# Patient Record
Sex: Male | Born: 1935
Health system: Southern US, Community
[De-identification: ages and names within clinical notes are randomized; demographics above are authoritative.]

## PROBLEM LIST (undated history)

## (undated) DIAGNOSIS — I82409 Acute embolism and thrombosis of unspecified deep veins of unspecified lower extremity: Secondary | ICD-10-CM

## (undated) DIAGNOSIS — H9190 Unspecified hearing loss, unspecified ear: Secondary | ICD-10-CM

## (undated) DIAGNOSIS — K259 Gastric ulcer, unspecified as acute or chronic, without hemorrhage or perforation: Secondary | ICD-10-CM

## (undated) DIAGNOSIS — M1990 Unspecified osteoarthritis, unspecified site: Secondary | ICD-10-CM

## (undated) DIAGNOSIS — I10 Essential (primary) hypertension: Secondary | ICD-10-CM

## (undated) HISTORY — PX: SHOULDER SURGERY: SHX246

## (undated) HISTORY — PX: JOINT REPLACEMENT: SHX530

## (undated) HISTORY — PX: REPLACEMENT TOTAL KNEE BILATERAL: SUR1225

## (undated) HISTORY — PX: OTHER SURGICAL HISTORY: SHX169

## (undated) HISTORY — DX: Acute embolism and thrombosis of unspecified deep veins of unspecified lower extremity: I82.409

---

## 1999-05-23 ENCOUNTER — Encounter: Admission: RE | Admit: 1999-05-23 | Discharge: 1999-06-24 | Payer: Self-pay | Admitting: Orthopedic Surgery

## 1999-09-17 ENCOUNTER — Encounter (INDEPENDENT_AMBULATORY_CARE_PROVIDER_SITE_OTHER): Payer: Self-pay | Admitting: Specialist

## 1999-09-17 ENCOUNTER — Ambulatory Visit (HOSPITAL_COMMUNITY): Admission: RE | Admit: 1999-09-17 | Discharge: 1999-09-17 | Payer: Self-pay | Admitting: Gastroenterology

## 2002-03-08 ENCOUNTER — Emergency Department (HOSPITAL_COMMUNITY): Admission: EM | Admit: 2002-03-08 | Discharge: 2002-03-08 | Payer: Self-pay | Admitting: Emergency Medicine

## 2009-07-24 ENCOUNTER — Ambulatory Visit (HOSPITAL_COMMUNITY): Admission: RE | Admit: 2009-07-24 | Discharge: 2009-07-24 | Payer: Self-pay | Admitting: Orthopedic Surgery

## 2010-09-12 NOTE — Procedures (Signed)
[facility]  Patient:    Steven Horton, Steven Horton                      MRN: [id number] Proc. Date: 09/17/99 Adm. Date:  [id number] Disc. Date: [id number] Attending:  Rafael Bihari CC:         Charles A. Harrington Challenger, M.D.                           Procedure Report  PROCEDURE:  Colonoscopy with polypectomy.  INDICATIONS FOR PROCEDURE:  Family history of colon cancer in a first degree relative.  DESCRIPTION OF PROCEDURE:  The patient was placed in the left lateral decubitus  position and placed on the pulse monitor with continuous low flow oxygen delivered by nasal cannula.  He was sedated with 50 mg of IV Demerol and 4 mg of IV Versed. The Olympus video colonoscope was inserted into the rectum and advanced to the cecum, confirmed by transillumination of McBurneys point and visualization of the ileocecal valve and appendiceal orifice.  The prep was good.  The cecum, ascending and transverse colon appeared normal with no masses, polyps, diverticula or other mucosal abnormalities.  Within the descending colon and sigmoid colon were seen  several scattered diverticula with no polyps.  In the rectum, there were seen two 8 mm sessile polyps, which were both fulgurated by hot biopsy.  The remainder of he rectum appeared normal down to the anus, where there were some small internal hemorrhoids seen.  The colonoscope was then withdrawn and the patient returned o the recovery room in stable condition.  He tolerated the procedure well and there were no immediate complications.  IMPRESSION: 1. Two rectal polyps. 2. Left sided diverticulosis. 3. Small internal hemorrhoids.  PLAN:  Await biopsy results for determination of interval for next surveillance  colonoscopy. DD:  09/17/99 TD:  09/22/99 Job: [id number]

## 2010-10-11 ENCOUNTER — Emergency Department (HOSPITAL_COMMUNITY)
Admission: EM | Admit: 2010-10-11 | Discharge: 2010-10-11 | Disposition: A | Discharge status: Home / Self Care | Payer: Medicare Other | Attending: Emergency Medicine | Admitting: Emergency Medicine

## 2010-10-11 ENCOUNTER — Emergency Department (HOSPITAL_COMMUNITY): Payer: Medicare Other

## 2010-10-11 DIAGNOSIS — R1310 Dysphagia, unspecified: Secondary | ICD-10-CM | POA: Insufficient documentation

## 2010-10-11 DIAGNOSIS — K222 Esophageal obstruction: Secondary | ICD-10-CM | POA: Insufficient documentation

## 2010-10-11 DIAGNOSIS — R0789 Other chest pain: Secondary | ICD-10-CM

## 2010-10-11 DIAGNOSIS — R634 Abnormal weight loss: Secondary | ICD-10-CM | POA: Insufficient documentation

## 2010-10-11 DIAGNOSIS — R630 Anorexia: Secondary | ICD-10-CM | POA: Insufficient documentation

## 2010-10-11 DIAGNOSIS — R61 Generalized hyperhidrosis: Secondary | ICD-10-CM | POA: Insufficient documentation

## 2010-10-11 DIAGNOSIS — Z79899 Other long term (current) drug therapy: Secondary | ICD-10-CM | POA: Insufficient documentation

## 2010-10-11 LAB — DIFFERENTIAL
Eosinophils Absolute: 0.1 10*3/uL (ref 0.0–0.7)
Eosinophils Relative: 1 % (ref 0–5)
Lymphocytes Relative: 13 % (ref 12–46)
Lymphs Abs: 1.2 10*3/uL (ref 0.7–4.0)
Monocytes Absolute: 0.9 10*3/uL (ref 0.1–1.0)
Neutro Abs: 7 10*3/uL (ref 1.7–7.7)

## 2010-10-11 LAB — CBC
Hemoglobin: 15.2 g/dL (ref 13.0–17.0)
MCH: 32.5 pg (ref 26.0–34.0)
MCHC: 35.5 g/dL (ref 30.0–36.0)
RBC: 4.67 MIL/uL (ref 4.22–5.81)
RDW: 14.2 % (ref 11.5–15.5)

## 2010-10-11 LAB — BASIC METABOLIC PANEL
CO2: 19 mEq/L (ref 19–32)
Calcium: 9.5 mg/dL (ref 8.4–10.5)
Creatinine, Ser: 1.71 mg/dL — ABNORMAL HIGH (ref 0.50–1.35)
GFR calc Af Amer: 47 mL/min — ABNORMAL LOW (ref >60)
Potassium: 3.9 mEq/L (ref 3.5–5.1)

## 2010-10-11 LAB — CK TOTAL AND CKMB (NOT AT ARMC): CK, MB: 3.4 ng/mL (ref 0.3–4.0)

## 2010-10-15 NOTE — Consult Note (Signed)
NAME:  Steven Horton, Steven Horton NO.:  [id number]  MEDICAL RECORD NO.:  [id number]  LOCATION:  [facility]                         FACILITY:  [facility]  PHYSICIAN:  Minus Breeding, MD, FACCDATE OF BIRTH:  1930/11/23  DATE OF CONSULTATION:  10/11/2010 DATE OF DISCHARGE:  10/11/2010                                CONSULTATION   PRIMARY CARE PROVIDER:  Antony Haste (C.Alan) Harrington Challenger, MD  CARDIOLOGIST:  None.  REASON FOR CONSULTATION:  Evaluate patient with chest discomfort.  HISTORY OF PRESENT ILLNESS:  The patient is a 77 year old gentleman without prior cardiac history.  He says that on Thursday a couple of hours after swallowing some pills he had some nausea and vomiting.  He reports that he did not take the pills with water as has been instructed.  Since that time he has had a discomfort in his chest because of pressure.  It happens with swallowing.  He has never had this before.  There is no neck or arm discomfort.  There is no associated diaphoresis or shortness of breath.  He has had no palpitations, presyncope or syncope.  He has been able to push a lawn mower without bringing on this discomfort.  Again, the only reproducible pain is with swallowing.  He had this off and on through Friday and today.  He presented to [facility] and was referred to the emergency room. Here his cardiac markers x1 have been negative.  EKG demonstrates no acute findings.  He otherwise has no prior cardiac history.  He has been well and denies any PND or orthopnea.  He is active around his house. He has had about a 45-pound weight loss in the last year with some anorexia.  PAST MEDICAL HISTORY:  Hypertension, hyperlipidemia.  PAST SURGICAL HISTORY:  Bilateral total knee replacement, right shoulder surgery x2, deviated septum surgery, multiple benign skin lesions removed apparently.  ALLERGIES:  None.  MEDICATIONS:  Doxycycline, fenofibrate, hydrochlorothiazide, tramadol.  SOCIAL HISTORY:  The  patient is single.  He does not have children.  He is in the emergency room with his sister.  He did smoke for about 20 years not heavily.  He quit 20 years ago.  FAMILY HISTORY:  Noncontributory for early coronary artery disease.  His mother had heart failure in her 22s.  REVIEW OF SYSTEMS:  As stated in the HPI.  He is positive for some mild streaking of red blood with constipation.  Otherwise, negative for all other systems.  PHYSICAL EXAMINATION:  GENERAL:  The patient is pleasant and in no distress. VITAL SIGNS:  Blood pressure 140/70, heart rate 68 and regular, afebrile, respiratory rate 16. HEENT:  Eyelids unremarkable.  Pupils equal, round and reactive to light, fundi not visualized, oral mucosa unremarkable. NECK:  No jugular venous distention at 45 degrees, carotid upstroke brisk and symmetric, no bruits, no thyromegaly. LYMPHATICS:  No cervical, axillary, inguinal. LUNGS:  Clear to auscultation bilaterally. BACK:  No costovertebral angle tenderness. CHEST:  Unremarkable. HEART:  PMI not displaced or sustained, S1-S2 within normal limits.  No S3, no S4, no clicks, no rubs, no murmurs. ABDOMEN:  Flat, positive bowel sounds normal in frequency and pitch, no bruits, no rebound, no guarding  or midline pulsatile mass.  No hepatomegaly, splenomegaly. SKIN:  No rashes, no nodules. EXTREMITIES:  2+ pulses throughout, no edema, no cyanosis or clubbing. NEURO:  Oriented to person, place, and time.  Cranial nerves II-XII grossly intact, motor grossly intact.  LABORATORY DATA:  Sodium 137, potassium 3.9, BUN 32, creatinine 1.71, CK 133, MD 3.4.  WBC 9.5, hemoglobin 15.2, platelets 216.  EKG sinus rhythm with premature atrial contractions, axis within normal limits, intervals within normal limits, early transition lead V2, no acute ST-T wave changes.  Chest x-ray no acute disease.  ASSESSMENT/PLAN: 1. The patient's chest comfort is very atypical.  He has a     reproducible  discomfort when he tries to swallow.  He has mild     cardiovascular risk factors.  His exam demonstrates no obvious     cardiac pathology.  EKG is normal.  Enzymes x1 are normal.  At this     point, the pretest probability of this discomfort is obstructive     coronary artery disease is extremely low.  It is most likely     something gastrointestinal.  It also must be noted that the patient     has had an unexplained weight loss with some anorexia.  No further     cardiac workup is indicated. 2. Hypertension.  Blood pressure is borderline but he continue with     meds as listed. 3. Dyslipidemia, apparently has some hypertriglyceridemia.  He is on     fenofibrate.  His labs were very good, last unable to check.  Over     the course defer to his primary provider.     Minus Breeding, MD, [facility]     JH/MEDQ  D:  10/11/2010  T:  10/12/2010  Job:  [id number]  cc:   Delmar Landau) Harrington Challenger, M.D.  Electronically Signed by Minus Breeding MD [facility] on 10/15/2010 11:47:42 AM

## 2010-11-28 ENCOUNTER — Other Ambulatory Visit: Payer: Self-pay | Admitting: Gastroenterology

## 2010-11-28 DIAGNOSIS — R634 Abnormal weight loss: Secondary | ICD-10-CM

## 2010-12-03 ENCOUNTER — Other Ambulatory Visit: Payer: 59

## 2010-12-05 ENCOUNTER — Ambulatory Visit
Admission: RE | Admit: 2010-12-05 | Discharge: 2010-12-05 | Disposition: A | Discharge status: Home / Self Care | Payer: Medicare Other | Source: Ambulatory Visit | Attending: Gastroenterology | Admitting: Gastroenterology

## 2010-12-05 DIAGNOSIS — R634 Abnormal weight loss: Secondary | ICD-10-CM

## 2010-12-05 MED ORDER — IOHEXOL 300 MG/ML  SOLN
65.0000 mL | Freq: Once | INTRAMUSCULAR | Status: AC | PRN
  Administered 2010-12-05: 65 mL via INTRAVENOUS

## 2011-03-06 ENCOUNTER — Encounter: Payer: Self-pay | Admitting: *Deleted

## 2011-03-06 ENCOUNTER — Emergency Department (HOSPITAL_COMMUNITY)
Admission: EM | Admit: 2011-03-06 | Discharge: 2011-03-06 | Disposition: A | Discharge status: Home / Self Care | Payer: Medicare Other | Attending: Emergency Medicine | Admitting: Emergency Medicine

## 2011-03-06 ENCOUNTER — Emergency Department (HOSPITAL_COMMUNITY): Payer: Medicare Other

## 2011-03-06 DIAGNOSIS — Y9301 Activity, walking, marching and hiking: Secondary | ICD-10-CM | POA: Insufficient documentation

## 2011-03-06 DIAGNOSIS — S20229A Contusion of unspecified back wall of thorax, initial encounter: Secondary | ICD-10-CM | POA: Insufficient documentation

## 2011-03-06 DIAGNOSIS — W19XXXA Unspecified fall, initial encounter: Secondary | ICD-10-CM | POA: Insufficient documentation

## 2011-03-06 DIAGNOSIS — I10 Essential (primary) hypertension: Secondary | ICD-10-CM | POA: Insufficient documentation

## 2011-03-06 DIAGNOSIS — Z79899 Other long term (current) drug therapy: Secondary | ICD-10-CM | POA: Insufficient documentation

## 2011-03-06 DIAGNOSIS — Z8739 Personal history of other diseases of the musculoskeletal system and connective tissue: Secondary | ICD-10-CM | POA: Insufficient documentation

## 2011-03-06 HISTORY — DX: Essential (primary) hypertension: I10

## 2011-03-06 HISTORY — DX: Gastric ulcer, unspecified as acute or chronic, without hemorrhage or perforation: K25.9

## 2011-03-06 HISTORY — DX: Unspecified osteoarthritis, unspecified site: M19.90

## 2011-03-06 MED ORDER — LIDOCAINE 5 % EX PTCH
1.0000 | MEDICATED_PATCH | CUTANEOUS | Status: DC
  Administered 2011-03-06: 1 via TRANSDERMAL
  Filled 2011-03-06: qty 1

## 2011-03-06 NOTE — ED Notes (Signed)
Pt c/o pain to low back/LT flank area.  States he's had bilat knee replacements and his legs give out on him on occasion.  Pt lives alone. Uses a cane to ambulate when he's out of the house.

## 2011-03-06 NOTE — ED Notes (Signed)
Pt says that he fell backwards, hitting his left lower back on an ottoman.

## 2011-03-06 NOTE — ED Notes (Signed)
Pt has returned from radiology on stretcher.  Has been given a urinal as requested.

## 2011-03-06 NOTE — ED Provider Notes (Signed)
History     CSN: [id number] Arrival date & time: 03/06/2011  3:45 PM   First MD Initiated Contact with Patient 03/06/11 1839      Chief Complaint  Patient presents with  . Fall    (Consider location/radiation/quality/duration/timing/severity/associated sxs/prior treatment) HPI Comments: Patient reports tripping over on him and falling and hitting his left lower back about 2:30 this afternoon he does have chronic back pain which is now worse in despite his medication use patient is concerned that he fractured his back although he is able to walk and drove himself to the emergency room  Patient is a 77 y.o. male presenting with fall. The history is provided by the patient.  Fall The accident occurred 3 to 5 hours ago. The fall occurred while walking. He fell from a height of 3 to 5 ft. He landed on carpet. The pain is at a severity of 4/10. The pain is mild. He was ambulatory at the scene. There was no entrapment after the fall. There was drug use involved in the accident. There was no alcohol use involved in the accident. The symptoms are aggravated by activity. He has tried nothing for the symptoms. The treatment provided no relief.    Past Medical History  Diagnosis Date  . Hypertension   . Stomach ulcer   . Arthritis     Past Surgical History  Procedure Date  . Replacement total knee bilateral   . Shoulder surgery     History reviewed. No pertinent family history.  History  Substance Use Topics  . Smoking status: Never Smoker   . Smokeless tobacco: Not on file  . Alcohol Use: No      Review of Systems  Constitutional: Negative for activity change.  HENT: Negative.   Eyes: Negative.   Respiratory: Negative.   Cardiovascular: Negative.   Gastrointestinal: Negative.   Genitourinary: Negative.   Musculoskeletal: Positive for back pain.  Neurological: Negative.   Hematological: Negative.   Psychiatric/Behavioral: Negative.     Allergies  Review of patient's  allergies indicates no known allergies.  Home Medications   Current Outpatient Rx  Name Route Sig Dispense Refill  . ASPIRIN EC 81 MG PO TBEC Oral Take 81 mg by mouth daily.      Marland Kitchen DOXYCYCLINE HYCLATE 100 MG PO TABS Oral Take 100 mg by mouth 2 (two) times daily.      . FENOFIBRATE 160 MG PO TABS Oral Take 160 mg by mouth daily.      Marland Kitchen HYDROCHLOROTHIAZIDE 25 MG PO TABS Oral Take 25 mg by mouth daily.      Creed Copper M PLUS PO TABS Oral Take 1 tablet by mouth daily.      . TRAMADOL HCL 50 MG PO TABS Oral Take 50 mg by mouth every 6 (six) hours as needed. Maximum dose= 8 tablets per day.  For pain.     Marland Kitchen VERAPAMIL HCL 240 MG PO TBCR Oral Take 240 mg by mouth 2 (two) times daily.        BP 135/78  Pulse 66  Temp(Src) 98.3 F (36.8 C) (Oral)  Resp 16  Ht 5\' 8"  (1.727 m)  Wt 170 lb (77.111 kg)  BMI 25.85 kg/m2  SpO2 98%  Physical Exam  Constitutional: He appears well-developed.  HENT:  Head: Normocephalic.  Eyes: EOM are normal.  Neck: Normal range of motion.  Cardiovascular: Normal rate.   Pulmonary/Chest: Breath sounds normal.  Abdominal: Soft.  Musculoskeletal: He exhibits tenderness.  Right shoulder: He exhibits tenderness. He exhibits no bony tenderness, no swelling, no deformity and no laceration.       Arms: Neurological: He is alert.  Skin: Skin is warm.  Psychiatric: He has a normal mood and affect.    ED Course  Procedures (including critical care time)  Labs Reviewed - No data to display No results found.   No diagnosis found.    MDM  Will xray back apply lidoderm patch for increase comfort and have patient follow up with his PCP   7:57 PM   No fractures   Garald Balding, NP 03/06/11 1854  Garald Balding, NP 03/06/11 [id number]

## 2011-03-07 NOTE — ED Provider Notes (Signed)
Medical screening examination/treatment/procedure(s) were performed by non-physician practitioner and as supervising physician I was immediately available for consultation/collaboration. Rolland Porter, MD, [facility]   Janice Norrie, MD 03/07/11 [phone]

## 2011-10-23 DIAGNOSIS — L719 Rosacea, unspecified: Secondary | ICD-10-CM | POA: Diagnosis not present

## 2011-10-23 DIAGNOSIS — E7800 Pure hypercholesterolemia, unspecified: Secondary | ICD-10-CM | POA: Diagnosis not present

## 2011-10-23 DIAGNOSIS — M1990 Unspecified osteoarthritis, unspecified site: Secondary | ICD-10-CM | POA: Diagnosis not present

## 2011-10-23 DIAGNOSIS — M545 Low back pain, unspecified: Secondary | ICD-10-CM | POA: Diagnosis not present

## 2011-10-23 DIAGNOSIS — I10 Essential (primary) hypertension: Secondary | ICD-10-CM | POA: Diagnosis not present

## 2011-12-07 DIAGNOSIS — L578 Other skin changes due to chronic exposure to nonionizing radiation: Secondary | ICD-10-CM | POA: Diagnosis not present

## 2011-12-31 DIAGNOSIS — M25569 Pain in unspecified knee: Secondary | ICD-10-CM | POA: Diagnosis not present

## 2012-01-18 DIAGNOSIS — L570 Actinic keratosis: Secondary | ICD-10-CM | POA: Diagnosis not present

## 2012-02-19 DIAGNOSIS — Z23 Encounter for immunization: Secondary | ICD-10-CM | POA: Diagnosis not present

## 2012-05-27 DIAGNOSIS — I10 Essential (primary) hypertension: Secondary | ICD-10-CM | POA: Diagnosis not present

## 2012-05-27 DIAGNOSIS — R42 Dizziness and giddiness: Secondary | ICD-10-CM | POA: Diagnosis not present

## 2012-07-04 DIAGNOSIS — R5383 Other fatigue: Secondary | ICD-10-CM | POA: Diagnosis not present

## 2012-07-04 DIAGNOSIS — I10 Essential (primary) hypertension: Secondary | ICD-10-CM | POA: Diagnosis not present

## 2012-07-04 DIAGNOSIS — R5381 Other malaise: Secondary | ICD-10-CM | POA: Diagnosis not present

## 2012-07-04 DIAGNOSIS — Z77090 Contact with and (suspected) exposure to asbestos: Secondary | ICD-10-CM | POA: Diagnosis not present

## 2012-10-20 DIAGNOSIS — R0609 Other forms of dyspnea: Secondary | ICD-10-CM | POA: Diagnosis not present

## 2012-10-20 DIAGNOSIS — M545 Low back pain, unspecified: Secondary | ICD-10-CM | POA: Diagnosis not present

## 2012-10-20 DIAGNOSIS — R351 Nocturia: Secondary | ICD-10-CM | POA: Diagnosis not present

## 2012-10-20 DIAGNOSIS — R0989 Other specified symptoms and signs involving the circulatory and respiratory systems: Secondary | ICD-10-CM | POA: Diagnosis not present

## 2012-10-20 DIAGNOSIS — Z0000 Encounter for general adult medical examination without abnormal findings: Secondary | ICD-10-CM | POA: Diagnosis not present

## 2012-10-20 DIAGNOSIS — L719 Rosacea, unspecified: Secondary | ICD-10-CM | POA: Diagnosis not present

## 2012-10-20 DIAGNOSIS — E7800 Pure hypercholesterolemia, unspecified: Secondary | ICD-10-CM | POA: Diagnosis not present

## 2012-10-20 DIAGNOSIS — K219 Gastro-esophageal reflux disease without esophagitis: Secondary | ICD-10-CM | POA: Diagnosis not present

## 2012-10-20 DIAGNOSIS — I10 Essential (primary) hypertension: Secondary | ICD-10-CM | POA: Diagnosis not present

## 2012-10-21 DIAGNOSIS — L719 Rosacea, unspecified: Secondary | ICD-10-CM | POA: Diagnosis not present

## 2012-10-21 DIAGNOSIS — I10 Essential (primary) hypertension: Secondary | ICD-10-CM | POA: Diagnosis not present

## 2012-10-21 DIAGNOSIS — M545 Low back pain, unspecified: Secondary | ICD-10-CM | POA: Diagnosis not present

## 2012-10-21 DIAGNOSIS — E7800 Pure hypercholesterolemia, unspecified: Secondary | ICD-10-CM | POA: Diagnosis not present

## 2012-10-21 DIAGNOSIS — R351 Nocturia: Secondary | ICD-10-CM | POA: Diagnosis not present

## 2012-10-21 DIAGNOSIS — K219 Gastro-esophageal reflux disease without esophagitis: Secondary | ICD-10-CM | POA: Diagnosis not present

## 2012-10-21 DIAGNOSIS — R0609 Other forms of dyspnea: Secondary | ICD-10-CM | POA: Diagnosis not present

## 2012-10-21 DIAGNOSIS — Z0000 Encounter for general adult medical examination without abnormal findings: Secondary | ICD-10-CM | POA: Diagnosis not present

## 2012-10-21 DIAGNOSIS — R0989 Other specified symptoms and signs involving the circulatory and respiratory systems: Secondary | ICD-10-CM | POA: Diagnosis not present

## 2012-10-25 DIAGNOSIS — L259 Unspecified contact dermatitis, unspecified cause: Secondary | ICD-10-CM | POA: Diagnosis not present

## 2012-10-25 DIAGNOSIS — L270 Generalized skin eruption due to drugs and medicaments taken internally: Secondary | ICD-10-CM | POA: Diagnosis not present

## 2012-10-25 DIAGNOSIS — D485 Neoplasm of uncertain behavior of skin: Secondary | ICD-10-CM | POA: Diagnosis not present

## 2012-10-25 DIAGNOSIS — L570 Actinic keratosis: Secondary | ICD-10-CM | POA: Diagnosis not present

## 2012-11-16 DIAGNOSIS — I10 Essential (primary) hypertension: Secondary | ICD-10-CM | POA: Diagnosis not present

## 2012-11-16 DIAGNOSIS — R0609 Other forms of dyspnea: Secondary | ICD-10-CM | POA: Diagnosis not present

## 2012-11-16 DIAGNOSIS — R0989 Other specified symptoms and signs involving the circulatory and respiratory systems: Secondary | ICD-10-CM | POA: Diagnosis not present

## 2012-11-16 DIAGNOSIS — Z0389 Encounter for observation for other suspected diseases and conditions ruled out: Secondary | ICD-10-CM | POA: Diagnosis not present

## 2012-11-16 DIAGNOSIS — R42 Dizziness and giddiness: Secondary | ICD-10-CM | POA: Diagnosis not present

## 2012-11-16 DIAGNOSIS — E7800 Pure hypercholesterolemia, unspecified: Secondary | ICD-10-CM | POA: Diagnosis not present

## 2012-11-24 DIAGNOSIS — R0609 Other forms of dyspnea: Secondary | ICD-10-CM | POA: Diagnosis not present

## 2012-11-24 DIAGNOSIS — R42 Dizziness and giddiness: Secondary | ICD-10-CM | POA: Diagnosis not present

## 2012-11-24 DIAGNOSIS — I10 Essential (primary) hypertension: Secondary | ICD-10-CM | POA: Diagnosis not present

## 2012-11-24 DIAGNOSIS — R0989 Other specified symptoms and signs involving the circulatory and respiratory systems: Secondary | ICD-10-CM | POA: Diagnosis not present

## 2012-11-24 DIAGNOSIS — E7800 Pure hypercholesterolemia, unspecified: Secondary | ICD-10-CM | POA: Diagnosis not present

## 2012-11-24 DIAGNOSIS — Z0389 Encounter for observation for other suspected diseases and conditions ruled out: Secondary | ICD-10-CM | POA: Diagnosis not present

## 2012-12-23 DIAGNOSIS — M545 Low back pain, unspecified: Secondary | ICD-10-CM | POA: Diagnosis not present

## 2012-12-23 DIAGNOSIS — Z23 Encounter for immunization: Secondary | ICD-10-CM | POA: Diagnosis not present

## 2012-12-30 ENCOUNTER — Other Ambulatory Visit: Payer: Self-pay | Admitting: Gastroenterology

## 2012-12-30 DIAGNOSIS — R1110 Vomiting, unspecified: Secondary | ICD-10-CM | POA: Diagnosis not present

## 2012-12-30 DIAGNOSIS — R112 Nausea with vomiting, unspecified: Secondary | ICD-10-CM

## 2013-01-04 ENCOUNTER — Ambulatory Visit
Admission: RE | Admit: 2013-01-04 | Discharge: 2013-01-04 | Disposition: A | Discharge status: Home / Self Care | Payer: Medicare Other | Source: Ambulatory Visit | Attending: Gastroenterology | Admitting: Gastroenterology

## 2013-01-04 DIAGNOSIS — R112 Nausea with vomiting, unspecified: Secondary | ICD-10-CM | POA: Diagnosis not present

## 2013-01-06 DIAGNOSIS — IMO0002 Reserved for concepts with insufficient information to code with codable children: Secondary | ICD-10-CM | POA: Diagnosis not present

## 2013-01-19 DIAGNOSIS — R1110 Vomiting, unspecified: Secondary | ICD-10-CM | POA: Diagnosis not present

## 2013-01-20 DIAGNOSIS — M47817 Spondylosis without myelopathy or radiculopathy, lumbosacral region: Secondary | ICD-10-CM | POA: Diagnosis not present

## 2013-01-23 ENCOUNTER — Other Ambulatory Visit (HOSPITAL_COMMUNITY): Payer: Self-pay | Admitting: Orthopedic Surgery

## 2013-01-23 ENCOUNTER — Ambulatory Visit (HOSPITAL_COMMUNITY)
Admission: RE | Admit: 2013-01-23 | Discharge: 2013-01-23 | Disposition: A | Discharge status: Home / Self Care | Payer: Medicare Other | Source: Ambulatory Visit | Attending: Orthopedic Surgery | Admitting: Orthopedic Surgery

## 2013-01-23 DIAGNOSIS — M7989 Other specified soft tissue disorders: Secondary | ICD-10-CM

## 2013-01-23 DIAGNOSIS — I824Y9 Acute embolism and thrombosis of unspecified deep veins of unspecified proximal lower extremity: Secondary | ICD-10-CM | POA: Insufficient documentation

## 2013-01-23 DIAGNOSIS — I824Z9 Acute embolism and thrombosis of unspecified deep veins of unspecified distal lower extremity: Secondary | ICD-10-CM | POA: Insufficient documentation

## 2013-01-23 DIAGNOSIS — M48061 Spinal stenosis, lumbar region without neurogenic claudication: Secondary | ICD-10-CM | POA: Diagnosis not present

## 2013-01-23 DIAGNOSIS — M25562 Pain in left knee: Secondary | ICD-10-CM

## 2013-01-23 NOTE — Progress Notes (Signed)
Bilateral lower extremity venous duplex completed.  Right:  No evidence of DVT, superficial thrombosis, or Baker's cyst.  Left: DVT noted in the popliteal and posterior tibial veins.  No evidence of superficial thrombosis.  No Baker's cyst.

## 2013-02-08 DIAGNOSIS — I82409 Acute embolism and thrombosis of unspecified deep veins of unspecified lower extremity: Secondary | ICD-10-CM | POA: Diagnosis not present

## 2013-02-09 ENCOUNTER — Other Ambulatory Visit: Payer: Self-pay | Admitting: *Deleted

## 2013-02-09 ENCOUNTER — Encounter: Payer: Self-pay | Admitting: Vascular Surgery

## 2013-02-09 DIAGNOSIS — I80299 Phlebitis and thrombophlebitis of other deep vessels of unspecified lower extremity: Secondary | ICD-10-CM

## 2013-02-10 ENCOUNTER — Encounter (HOSPITAL_COMMUNITY): Payer: Self-pay | Admitting: Emergency Medicine

## 2013-02-10 ENCOUNTER — Ambulatory Visit (HOSPITAL_COMMUNITY)
Admission: RE | Admit: 2013-02-10 | Discharge: 2013-02-10 | Disposition: A | Discharge status: Home / Self Care | Payer: Medicare Other | Source: Ambulatory Visit | Attending: Vascular Surgery | Admitting: Vascular Surgery

## 2013-02-10 ENCOUNTER — Encounter: Payer: Self-pay | Admitting: Vascular Surgery

## 2013-02-10 ENCOUNTER — Emergency Department (HOSPITAL_COMMUNITY)
Admission: EM | Admit: 2013-02-10 | Discharge: 2013-02-10 | Disposition: A | Discharge status: Home / Self Care | Payer: Medicare Other | Attending: Emergency Medicine | Admitting: Emergency Medicine

## 2013-02-10 ENCOUNTER — Ambulatory Visit (INDEPENDENT_AMBULATORY_CARE_PROVIDER_SITE_OTHER): Payer: Medicare Other | Admitting: Vascular Surgery

## 2013-02-10 VITALS — BP 122/59 | HR 71 | Ht 68.0 in | Wt 175.0 lb

## 2013-02-10 DIAGNOSIS — Z7982 Long term (current) use of aspirin: Secondary | ICD-10-CM | POA: Insufficient documentation

## 2013-02-10 DIAGNOSIS — M129 Arthropathy, unspecified: Secondary | ICD-10-CM | POA: Diagnosis not present

## 2013-02-10 DIAGNOSIS — M25569 Pain in unspecified knee: Secondary | ICD-10-CM

## 2013-02-10 DIAGNOSIS — N189 Chronic kidney disease, unspecified: Secondary | ICD-10-CM | POA: Insufficient documentation

## 2013-02-10 DIAGNOSIS — I82402 Acute embolism and thrombosis of unspecified deep veins of left lower extremity: Secondary | ICD-10-CM

## 2013-02-10 DIAGNOSIS — M7989 Other specified soft tissue disorders: Secondary | ICD-10-CM | POA: Insufficient documentation

## 2013-02-10 DIAGNOSIS — R609 Edema, unspecified: Secondary | ICD-10-CM | POA: Insufficient documentation

## 2013-02-10 DIAGNOSIS — I129 Hypertensive chronic kidney disease with stage 1 through stage 4 chronic kidney disease, or unspecified chronic kidney disease: Secondary | ICD-10-CM | POA: Diagnosis not present

## 2013-02-10 DIAGNOSIS — Z79899 Other long term (current) drug therapy: Secondary | ICD-10-CM | POA: Diagnosis not present

## 2013-02-10 DIAGNOSIS — I80299 Phlebitis and thrombophlebitis of other deep vessels of unspecified lower extremity: Secondary | ICD-10-CM | POA: Insufficient documentation

## 2013-02-10 DIAGNOSIS — I82409 Acute embolism and thrombosis of unspecified deep veins of unspecified lower extremity: Secondary | ICD-10-CM | POA: Diagnosis not present

## 2013-02-10 DIAGNOSIS — Z87891 Personal history of nicotine dependence: Secondary | ICD-10-CM | POA: Insufficient documentation

## 2013-02-10 DIAGNOSIS — I749 Embolism and thrombosis of unspecified artery: Secondary | ICD-10-CM | POA: Diagnosis not present

## 2013-02-10 DIAGNOSIS — K259 Gastric ulcer, unspecified as acute or chronic, without hemorrhage or perforation: Secondary | ICD-10-CM | POA: Insufficient documentation

## 2013-02-10 DIAGNOSIS — M7120 Synovial cyst of popliteal space [Baker], unspecified knee: Secondary | ICD-10-CM | POA: Diagnosis not present

## 2013-02-10 DIAGNOSIS — R5381 Other malaise: Secondary | ICD-10-CM | POA: Diagnosis not present

## 2013-02-10 DIAGNOSIS — M25562 Pain in left knee: Secondary | ICD-10-CM

## 2013-02-10 DIAGNOSIS — Z96659 Presence of unspecified artificial knee joint: Secondary | ICD-10-CM | POA: Insufficient documentation

## 2013-02-10 DIAGNOSIS — M660 Rupture of popliteal cyst: Secondary | ICD-10-CM

## 2013-02-10 DIAGNOSIS — M79609 Pain in unspecified limb: Secondary | ICD-10-CM | POA: Diagnosis not present

## 2013-02-10 DIAGNOSIS — R404 Transient alteration of awareness: Secondary | ICD-10-CM | POA: Diagnosis not present

## 2013-02-10 LAB — BASIC METABOLIC PANEL
BUN: 30 mg/dL — ABNORMAL HIGH (ref 6–23)
CO2: 21 mEq/L (ref 19–32)
Calcium: 9.1 mg/dL (ref 8.4–10.5)
Chloride: 107 mEq/L (ref 96–112)
Creatinine, Ser: 1.59 mg/dL — ABNORMAL HIGH (ref 0.50–1.35)
GFR calc Af Amer: 46 mL/min — ABNORMAL LOW (ref >90)
Glucose, Bld: 111 mg/dL — ABNORMAL HIGH (ref 70–99)

## 2013-02-10 LAB — CBC
HCT: 32 % — ABNORMAL LOW (ref 39.0–52.0)
Hemoglobin: 10.8 g/dL — ABNORMAL LOW (ref 13.0–17.0)
MCH: 32.8 pg (ref 26.0–34.0)
MCV: 97.3 fL (ref 78.0–100.0)
Platelets: 258 10*3/uL (ref 150–400)
RDW: 16.7 % — ABNORMAL HIGH (ref 11.5–15.5)
WBC: 6.5 10*3/uL (ref 4.0–10.5)

## 2013-02-10 MED ORDER — MORPHINE SULFATE 2 MG/ML IJ SOLN
2.0000 mg | INTRAMUSCULAR | Status: DC | PRN
  Administered 2013-02-10: 2 mg via INTRAVENOUS
  Filled 2013-02-10: qty 1

## 2013-02-10 MED ORDER — FUROSEMIDE 10 MG/ML IJ SOLN
40.0000 mg | Freq: Once | INTRAMUSCULAR | Status: AC
  Administered 2013-02-10: 40 mg via INTRAVENOUS
  Filled 2013-02-10: qty 4

## 2013-02-10 MED ORDER — HYDROCODONE-ACETAMINOPHEN 5-325 MG PO TABS: 1.0000 | ORAL_TABLET | ORAL | Status: DC | PRN

## 2013-02-10 NOTE — ED Notes (Signed)
Ambulated with the walker well steady minimal  Assistant denies dizziness pain free

## 2013-02-10 NOTE — ED Notes (Signed)
Pt states he has a walker and cane at home that he can use, states he lives at home alone. Pt denies pain at this time and observed ambulating with walker without staff assistance. Will keep pt sticker and have social work follow up with patient in the morning.

## 2013-02-10 NOTE — Progress Notes (Signed)
[facility] [place]  Referred by:  Steven Crutch, MD [place], [place] [id number]  Reason for referral: Swollen left leg  History of Present Illness  Steven Horton is a 79 y.o. ([birth year]) male who presents with chief complaint: swollen painful left leg.  Patient notes, swelling problems years ago.  Recently he was diagnosed with a DVT and started on Xarelto.  Last night his pain in his left calf was severe enough that he had to go to ER for further evaluation.  The patient's symptoms include: severe pain in L calf with increased swelling over the last few days.  The patient has had known history of DVT, no history of pregnancy, known history of varicose vein, no history of venous stasis ulcers, no history of  Lymphedema and known history of skin changes in lower legs.  There is no family history of venous disorders.  The patient has never used compression stockings in the past.  Past Medical History  Diagnosis Date  . Hypertension   . Stomach ulcer   . Arthritis   . DVT (deep venous thrombosis)     Past Surgical History  Procedure Laterality Date  . Replacement total knee bilateral    . Shoulder surgery    . Joint replacement      History   Social History  . Marital Status: Single    Spouse Name: N/A    Number of Children: N/A  . Years of Education: N/A   Occupational History  . Not on file.   Social History Main Topics  . Smoking status: Former Smoker    Types: Cigarettes    Quit date: 04/27/1978  . Smokeless tobacco: Not on file  . Alcohol Use: 2.4 oz/week    4 Shots of liquor per week  . Drug Use: No  . Sexual Activity: Not on file   Other Topics Concern  . Not on file   Social History Narrative  . No narrative on file    Family History  Problem Relation Age of Onset  . Cancer Sister    Current Outpatient Prescriptions on File Prior to Visit  Medication Sig Dispense Refill  . aspirin EC 81 MG tablet Take 81 mg by mouth  daily.        Marland Kitchen doxycycline (VIBRA-TABS) 100 MG tablet Take 100 mg by mouth 2 (two) times daily.        . fenofibrate 160 MG tablet Take 160 mg by mouth daily.        . Multiple Vitamins-Minerals (MULTIVITAMINS THER. W/MINERALS) TABS Take 1 tablet by mouth daily.        . verapamil (CALAN-SR) 240 MG CR tablet Take 240 mg by mouth 2 (two) times daily.        . traMADol (ULTRAM) 50 MG tablet Take 50 mg by mouth every 6 (six) hours as needed. Maximum dose= 8 tablets per day.  For pain.        No current facility-administered medications on file prior to visit.    No Known Allergies   REVIEW OF SYSTEMS:  (Positives checked otherwise negative)  CARDIOVASCULAR:  []  chest pain, []  chest pressure, []  palpitations, []  shortness of breath when laying flat, []  shortness of breath with exertion,  [x]  pain in feet when walking, [x]  pain in feet when laying flat, []  history of blood clot in veins (DVT), []  history of phlebitis, [x]  swelling in legs, []  varicose veins  PULMONARY:  []  productive cough, []   asthma, []  wheezing  NEUROLOGIC:  []  weakness in arms or legs, []  numbness in arms or legs, []  difficulty speaking or slurred speech, []  temporary loss of vision in one eye, []  dizziness  HEMATOLOGIC:  []  bleeding problems, []  problems with blood clotting too easily  MUSCULOSKEL:  []  joint pain, []  joint swelling  GASTROINTEST:  []  vomiting blood, []  blood in stool     GENITOURINARY:  []  burning with urination, []  blood in urine  PSYCHIATRIC:  []  history of major depression  INTEGUMENTARY:  []  rashes, []  ulcers  CONSTITUTIONAL:  []  fever, []  chills  Physical Examination Filed Vitals:   02/10/13 1413  BP: 122/59  Pulse: 71  Height: 5\' 8"  (1.727 m)  Weight: 175 lb (79.379 kg)  SpO2: 100%   Body mass index is 26.61 kg/(m^2).  General: A&O x 3, WDWN, elderly  Head: [place]/AT  Ear/Nose/Throat: Hearing grossly intact, nares w/o erythema or drainage, oropharynx w/o Erythema/Exudate  Eyes:  PERRLA, EOMI  Neck: Supple, no nuchal rigidity, no palpable LAD  Pulmonary: Sym exp, good air movt, CTAB, no rales, rhonchi, & wheezing  Cardiac: RRR, Nl S1, S2, no Murmurs, rubs or gallops  Vascular: Vessel Right Left  Radial Palpable Palpable  Brachial Palpable Palpable  Carotid Palpable, without bruit Palpable, without bruit  Aorta Not palpable N/A  Femoral Palpable Palpable  Popliteal Not palpable Not palpable  PT Not Palpable Not Palpable  DP Faintly Palpable Faintly Palpable   Gastrointestinal: soft, NTND, -G/R, - HSM, - masses, - CVAT B  Musculoskeletal: M/S 5/5 throughout , DF/PF intact L foot, Extremities without ischemic changes , B severe LDS, B varicosities and spider veins, B cyanotic feet, L >> R calf circumference, LLE 3+ edema, RLE 1+ edema, swollen L calf  Neurologic: CN 2-12 intact , Pain and light touch intact in extremities , Motor exam as listed above  Psychiatric: Judgment intact, Mood & affect appropriate for pt's clinical situation  Dermatologic: See M/S exam for extremity exam, no rashes otherwise noted  Lymph : No Cervical, Axillary, or Inguinal lymphadenopathy   Non-Invasive Vascular Imaging  LLE Venous Duplex (Date: 02/10/2013):   Technically difficult and limited study  No DVT in L CFV, FV, peroneal vein, and GSV  Pop vein and PT vein obscured by possible ruptured Baker's cyst 7.75 cm x 2.93 cm  Outside Studies/Documentation 2 pages of outside documents were reviewed including: outpatient clinic.  Medical Decision Making  Steven Horton is a 79 y.o. male who presents with: BLE chronic venous insufficiency (C4), LLE DVT   Despite swelling in L calf, no evidence of compartment syndrome clinically.  No evidence of phlegmasia at this point.  Patient has severe CVI as evident by the extent of LDS in BLE.  While a Baker cyst is diagnosed, there is some risk that the L calf mass is hematoma from a spontaneous rupture.  Regardless, there is  no evidence of compartment syndrome at this point.  We discussed the need to go to the ER if the swelling worsens, he develops any anesthesia or weakness in the left foot.    Based on the patient's history and examination, I recommend: compression of L calf: wrapped L calf to thigh with ACE bandage  Continue Xarelto  Follow up in one week for re-evalation.  Thank you for allowing [place] to participate in this patient's care.  Adele Barthel, MD [facility] [place] Office: [phone] Pager: [phone]  02/10/2013, 2:53 PM

## 2013-02-10 NOTE — ED Notes (Signed)
Bed: [id number] Expected date:  Expected time:  Means of arrival:  Comments: EMS/elderly male from home-hx blood clot LLE-swollen and painful-unable to ambulate

## 2013-02-10 NOTE — ED Provider Notes (Signed)
TIME SEEN: 1:20 AM  CHIEF COMPLAINT: Lower extremity pain and swelling  HPI: Patient is an 79 year old male with a history of hypertension, recent diagnosis of left lower extremity DVT on September 29 who presents the emergency department with increasing pain and swelling in his left lower extremity. He reports that he has been taking Xarelto 15mg  daily without missed doses but notices that if he does a lot of standing in a day, he will develop increased pain and swelling. He normally is able to ambulate using a walker. He reports that tonight when getting out of bed to use the restroom he had pain with ambulation and when trying to get back in bed he was unable to bend at the knee and swing his left leg into bed. Because of this, he called EMS. He states upon my evaluation that he is ready feeling better and can flex at the knee better and his pain is almost resolved. Denies any history of injury to the leg. Denies any fever. No numbness or tingling. No chest pain or shortness of breath. He has followup with Dr. Bridgett Larsson with vascular surgery at 12:45 PM today. His PCP is Dr. Harrington Challenger with [facility].  ROS: See HPI Constitutional: no fever  Eyes: no drainage  ENT: no runny nose   Cardiovascular:  no chest pain  Resp: no SOB  GI: no vomiting GU: no dysuria Integumentary: no rash  Allergy: no hives  Musculoskeletal: no leg swelling  Neurological: no slurred speech ROS otherwise negative  PAST MEDICAL HISTORY/PAST SURGICAL HISTORY:  Past Medical History  Diagnosis Date  . Hypertension   . Stomach ulcer   . Arthritis     MEDICATIONS:  Prior to Admission medications   Medication Sig Start Date End Date Taking? Authorizing Provider  aspirin EC 81 MG tablet Take 81 mg by mouth daily.      Historical Provider, MD  doxycycline (VIBRA-TABS) 100 MG tablet Take 100 mg by mouth 2 (two) times daily.      Historical Provider, MD  fenofibrate 160 MG tablet Take 160 mg by mouth daily.      Historical  Provider, MD  hydrochlorothiazide (HYDRODIURIL) 25 MG tablet Take 25 mg by mouth daily.      Historical Provider, MD  Multiple Vitamins-Minerals (MULTIVITAMINS THER. W/MINERALS) TABS Take 1 tablet by mouth daily.      Historical Provider, MD  traMADol (ULTRAM) 50 MG tablet Take 50 mg by mouth every 6 (six) hours as needed. Maximum dose= 8 tablets per day.  For pain.     Historical Provider, MD  verapamil (CALAN-SR) 240 MG CR tablet Take 240 mg by mouth 2 (two) times daily.      Historical Provider, MD    ALLERGIES:  No Known Allergies  SOCIAL HISTORY:  History  Substance Use Topics  . Smoking status: Never Smoker   . Smokeless tobacco: Not on file  . Alcohol Use: No    FAMILY HISTORY: History reviewed. No pertinent family history.  EXAM: BP 130/53  Pulse 94  SpO2 94% CONSTITUTIONAL: Alert and oriented and responds appropriately to questions. Well-appearing; well-nourished HEAD: Normocephalic EYES: Conjunctivae clear, PERRL ENT: normal nose; no rhinorrhea; moist mucous membranes; pharynx without lesions noted NECK: Supple, no meningismus, no LAD  CARD: RRR; S1 and S2 appreciated; no murmurs, no clicks, no rubs, no gallops RESP: Normal chest excursion without splinting or tachypnea; breath sounds clear and equal bilaterally; no wheezes, no rhonchi, no rales,  ABD/GI: Normal bowel sounds; non-distended; soft,  non-tender, no rebound, no guarding BACK:  The back appears normal and is non-tender to palpation, there is no CVA tenderness EXT: Normal ROM in all joints; non-tender to palpation; patient has bilateral lower extremity swelling worse on the left than the right, dopplerable DP and PT pulses bilaterally, sensation to light touch intact diffusely, pitting edema to the level of the knee bilaterally; no joint effusion or signs of cellulitis. SKIN: Normal color for age and race; warm NEURO: Moves all extremities equally PSYCH: The patient's mood and manner are appropriate. Grooming  and personal hygiene are appropriate.  MEDICAL DECISION MAKING: Patient with bilateral lower extremity swelling, worse on the left than the right. He is a known DVT and is currently on Xarelto.  Suspect his increased pain and swelling is secondary to dependent edema. He already reports feeling much better without intervention. We'll give Lasix, pain medication and check basic labs. Anticipate if patient is able to cannulate, we will discharge him home today he can followup with his vascular surgeon today at 1245. He is comfortable with this plan.  ED PROGRESS: Labs unremarkable. Patient does have a slightly elevated creatinine which is his baseline. Will ambulate patient in the ED.   Pt able to ambulate without difficulty.  He would like dc home with out pt follow up.  Will dc with pain meds, return precautions.  Scheduled to see the vascular surgeon today.    [place], DO 02/10/13 [phone]

## 2013-02-10 NOTE — ED Notes (Signed)
ptar called for pt transportation home

## 2013-02-10 NOTE — ED Notes (Signed)
Per ems pt here for c/o lt leg swelling pt stated he has a clot in that leg and he was to MD today , but he stated that swelling increased and he couldn't walk live at home along

## 2013-02-13 ENCOUNTER — Ambulatory Visit (INDEPENDENT_AMBULATORY_CARE_PROVIDER_SITE_OTHER): Payer: Medicare Other | Admitting: Surgery

## 2013-02-13 ENCOUNTER — Encounter: Payer: Self-pay | Admitting: Surgery

## 2013-02-13 VITALS — BP 121/65 | HR 88 | Ht 68.0 in | Wt 175.0 lb

## 2013-02-13 DIAGNOSIS — M7989 Other specified soft tissue disorders: Secondary | ICD-10-CM

## 2013-02-13 DIAGNOSIS — I82409 Acute embolism and thrombosis of unspecified deep veins of unspecified lower extremity: Secondary | ICD-10-CM

## 2013-02-13 DIAGNOSIS — I82402 Acute embolism and thrombosis of unspecified deep veins of left lower extremity: Secondary | ICD-10-CM

## 2013-02-13 NOTE — Progress Notes (Signed)
[facility]  OFFICE NOTE  CC:  Swelling left leg with hx of DVT  HPI:  This is a 79 y.o. male who is here for f/u to his appt last Friday with Dr. Bridgett Larsson.  Pt states he had seen Dr. Lynann Bologna 3-4 weeks ago for arthritis in his back and was started on Prednisone and shortly afterward, he had swelling of his left leg.  He was then sent to [facility] where he was diagnosed with a DVT.  He was then sent to his PCP, Dr. Harrington Challenger and was started on Xarelto.  He was then sent here for a venous duplex, which revealed a large mass in the proximal to mid left calf with echoes visualized, probable ruptured Baker's cyst.  Pt states that he did measure his calf when this started and it measured ~ 22 inches at his left calf.   He states that he does have minimal pain with palpation, but "it is nothing to scream about".   It is not any worse from last week.  No Known Allergies  Current Outpatient Prescriptions  Medication Sig Dispense Refill  . aspirin EC 81 MG tablet Take 81 mg by mouth daily.        Marland Kitchen doxycycline (VIBRA-TABS) 100 MG tablet Take 100 mg by mouth 2 (two) times daily.        . fenofibrate 160 MG tablet Take 160 mg by mouth daily.        . methylPREDNIsolone (MEDROL DOSPACK) 4 MG tablet       . Multiple Vitamins-Minerals (MULTIVITAMINS THER. W/MINERALS) TABS Take 1 tablet by mouth daily.        Marland Kitchen omeprazole-sodium bicarbonate (ZEGERID) 40-1100 MG per capsule Take 1 capsule by mouth daily before breakfast.      . Rivaroxaban (XARELTO) 15 MG TABS tablet Take 15 mg by mouth 2 (two) times daily with a meal.      . traMADol (ULTRAM) 50 MG tablet Take 50 mg by mouth every 6 (six) hours as needed. Maximum dose= 8 tablets per day.  For pain.       . verapamil (CALAN-SR) 240 MG CR tablet Take 240 mg by mouth 2 (two) times daily.        . verapamil (CALAN-SR) 240 MG CR tablet       . HYDROcodone-acetaminophen (NORCO/VICODIN) 5-325 MG per tablet Take 1 tablet by mouth every 4 (four) hours as needed for  pain.  15 tablet  0   No current facility-administered medications for this visit.     ROS:  See HPI  Physical Exam:  Filed Vitals:   02/13/13 1521  BP: 121/65  Pulse: 88     Extremities:  LLE with 3+ edema, non-tender to palpation; left leg is warm.  Sensation and motor are in tact.   A/P:  This is a 79 y.o. male here for swelling in his LLE with hx of DVT and possible rupture of Baker's cyst.  -pt's swelling is not any worse today according to the pt and pain has not worsened. -continue compression and elevation.  Will have [place] got to pt's house tomorrow for 4 layer profore to be changed 3x/week.  He will f/u with Dr. Bridgett Larsson on 02/24/13.   Leontine Locket, PA-C [facility] [phone]  Clinic MD:  Pt seen and examined with Dr. Trula Slade  I agree with the above.  The patient has been seen and examined.  He had severe pitting edema to the left leg.  There is no evidence of compartment syndrome.  His foot is well-perfused.  I feel that he needs to be placed in compression.  I placed an Ace wrap with [place]an today in the office.  I am having home health go out to place a 4 layer Profore tomorrow.  The ultimate goal full be able to get him into compression stockings.  He may ultimately require a lower extremity venous imaging study, either venogram or CT venogram to evaluate him for a venous obstruction within the popliteal space.  Rock Nephew

## 2013-02-15 DIAGNOSIS — Z7901 Long term (current) use of anticoagulants: Secondary | ICD-10-CM | POA: Diagnosis not present

## 2013-02-15 DIAGNOSIS — M7120 Synovial cyst of popliteal space [Baker], unspecified knee: Secondary | ICD-10-CM | POA: Diagnosis not present

## 2013-02-15 DIAGNOSIS — M25569 Pain in unspecified knee: Secondary | ICD-10-CM | POA: Diagnosis not present

## 2013-02-15 DIAGNOSIS — I824Z9 Acute embolism and thrombosis of unspecified deep veins of unspecified distal lower extremity: Secondary | ICD-10-CM | POA: Diagnosis not present

## 2013-02-16 DIAGNOSIS — M25569 Pain in unspecified knee: Secondary | ICD-10-CM | POA: Diagnosis not present

## 2013-02-16 DIAGNOSIS — I824Z9 Acute embolism and thrombosis of unspecified deep veins of unspecified distal lower extremity: Secondary | ICD-10-CM | POA: Diagnosis not present

## 2013-02-16 DIAGNOSIS — M7120 Synovial cyst of popliteal space [Baker], unspecified knee: Secondary | ICD-10-CM | POA: Diagnosis not present

## 2013-02-16 DIAGNOSIS — Z7901 Long term (current) use of anticoagulants: Secondary | ICD-10-CM | POA: Diagnosis not present

## 2013-02-17 DIAGNOSIS — Z7901 Long term (current) use of anticoagulants: Secondary | ICD-10-CM | POA: Diagnosis not present

## 2013-02-17 DIAGNOSIS — M7120 Synovial cyst of popliteal space [Baker], unspecified knee: Secondary | ICD-10-CM | POA: Diagnosis not present

## 2013-02-17 DIAGNOSIS — I824Z9 Acute embolism and thrombosis of unspecified deep veins of unspecified distal lower extremity: Secondary | ICD-10-CM | POA: Diagnosis not present

## 2013-02-17 DIAGNOSIS — M25569 Pain in unspecified knee: Secondary | ICD-10-CM | POA: Diagnosis not present

## 2013-02-20 DIAGNOSIS — M7120 Synovial cyst of popliteal space [Baker], unspecified knee: Secondary | ICD-10-CM | POA: Diagnosis not present

## 2013-02-20 DIAGNOSIS — Z7901 Long term (current) use of anticoagulants: Secondary | ICD-10-CM | POA: Diagnosis not present

## 2013-02-20 DIAGNOSIS — I824Z9 Acute embolism and thrombosis of unspecified deep veins of unspecified distal lower extremity: Secondary | ICD-10-CM | POA: Diagnosis not present

## 2013-02-20 DIAGNOSIS — M25569 Pain in unspecified knee: Secondary | ICD-10-CM | POA: Diagnosis not present

## 2013-02-22 DIAGNOSIS — M7120 Synovial cyst of popliteal space [Baker], unspecified knee: Secondary | ICD-10-CM | POA: Diagnosis not present

## 2013-02-22 DIAGNOSIS — M25569 Pain in unspecified knee: Secondary | ICD-10-CM | POA: Diagnosis not present

## 2013-02-22 DIAGNOSIS — Z7901 Long term (current) use of anticoagulants: Secondary | ICD-10-CM | POA: Diagnosis not present

## 2013-02-22 DIAGNOSIS — I824Z9 Acute embolism and thrombosis of unspecified deep veins of unspecified distal lower extremity: Secondary | ICD-10-CM | POA: Diagnosis not present

## 2013-02-24 ENCOUNTER — Encounter: Payer: Self-pay | Admitting: Surgery

## 2013-02-24 DIAGNOSIS — Z7901 Long term (current) use of anticoagulants: Secondary | ICD-10-CM | POA: Diagnosis not present

## 2013-02-24 DIAGNOSIS — M25569 Pain in unspecified knee: Secondary | ICD-10-CM | POA: Diagnosis not present

## 2013-02-24 DIAGNOSIS — M7120 Synovial cyst of popliteal space [Baker], unspecified knee: Secondary | ICD-10-CM | POA: Diagnosis not present

## 2013-02-24 DIAGNOSIS — I824Z9 Acute embolism and thrombosis of unspecified deep veins of unspecified distal lower extremity: Secondary | ICD-10-CM | POA: Diagnosis not present

## 2013-02-27 ENCOUNTER — Ambulatory Visit (INDEPENDENT_AMBULATORY_CARE_PROVIDER_SITE_OTHER): Payer: Medicare Other | Admitting: Surgery

## 2013-02-27 ENCOUNTER — Encounter: Payer: Self-pay | Admitting: Surgery

## 2013-02-27 VITALS — BP 160/74 | HR 71 | Ht 68.0 in | Wt 180.0 lb

## 2013-02-27 DIAGNOSIS — M7989 Other specified soft tissue disorders: Secondary | ICD-10-CM

## 2013-02-27 NOTE — Progress Notes (Signed)
[facility] OFFICE NOTE  CC:  F/u for swelling in left leg with hx of DVT  HPI:  This is a 79 y.o. male who is was seen here a couple of weeks ago for swelling in his LLE due to DVT.  He was placed in a 4 layer Profore boot and is following up today.  His compression dressing is being changed 3 times a week by home health.  No Known Allergies  Current Outpatient Prescriptions  Medication Sig Dispense Refill  . aspirin EC 81 MG tablet Take 81 mg by mouth daily.        Marland Kitchen doxycycline (VIBRA-TABS) 100 MG tablet Take 100 mg by mouth 2 (two) times daily.        . fenofibrate 160 MG tablet Take 160 mg by mouth daily.        Marland Kitchen HYDROcodone-acetaminophen (NORCO/VICODIN) 5-325 MG per tablet Take 1 tablet by mouth every 4 (four) hours as needed for pain.  15 tablet  0  . methylPREDNIsolone (MEDROL DOSPACK) 4 MG tablet       . Multiple Vitamins-Minerals (MULTIVITAMINS THER. W/MINERALS) TABS Take 1 tablet by mouth daily.        Marland Kitchen omeprazole-sodium bicarbonate (ZEGERID) 40-1100 MG per capsule Take 1 capsule by mouth daily before breakfast.      . Rivaroxaban (XARELTO) 15 MG TABS tablet Take 15 mg by mouth 2 (two) times daily with a meal.      . traMADol (ULTRAM) 50 MG tablet Take 50 mg by mouth every 6 (six) hours as needed. Maximum dose= 8 tablets per day.  For pain.       . verapamil (CALAN-SR) 240 MG CR tablet Take 240 mg by mouth 2 (two) times daily.        . verapamil (CALAN-SR) 240 MG CR tablet        No current facility-administered medications for this visit.     ROS:  See HPI, otherwise negative  Physical Exam:  Filed Vitals:   02/27/13 1555  BP: 160/74  Pulse: 71   Extremities:  LLE swelling is significantly improved-left leg is warm. CV:  Pedal; pulse not palpable secondary to edema Skin:  Minimal residual ulceration  Pulm:  Respirations non-labored HEENT:  WNL  A/P:  This is a 79 y.o. male here for f/u for LLE edema after 4 layer profore boot placed.   -his  edema has improved since his last visit. -continue elevation of leg.  Will continue the profore for 2 weeks.  Will leave wrap off today and let the pt shower tonight.  HH to come to house tomorrow to place wrap  -will have him f/u in 2 weeks and be measured for compression stockings.   -he will hold off on PT until he is in a compression stocking He will get a venous reflux evaluation at his next visit.  Leontine Locket, PA-C [facility] [phone]  Clinic MD:  Pt seen and examined with Dr. Trula Slade   I agree with the above.  The patient has been seen and examined.  There has been significant improvement in the swelling in his left leg with the application of compression.  In addition, the skin breakdown has significantly improved.  My hope is that he can be transitioned over to a compression garment within the next 2 weeks.  There is still a slight skin tear on the lateral side of the left leg that we'll need to be completely healed before placing  him into compression.  In addition, when he returns in 2 weeks out like to have him evaluated for superficial venous reflux to see if he may be a candidate for endovenous ablation at a later date once the acute DVT is been treated.  Annamarie Major

## 2013-02-28 DIAGNOSIS — M7120 Synovial cyst of popliteal space [Baker], unspecified knee: Secondary | ICD-10-CM | POA: Diagnosis not present

## 2013-02-28 DIAGNOSIS — M25569 Pain in unspecified knee: Secondary | ICD-10-CM | POA: Diagnosis not present

## 2013-02-28 DIAGNOSIS — I824Z9 Acute embolism and thrombosis of unspecified deep veins of unspecified distal lower extremity: Secondary | ICD-10-CM | POA: Diagnosis not present

## 2013-02-28 DIAGNOSIS — Z7901 Long term (current) use of anticoagulants: Secondary | ICD-10-CM | POA: Diagnosis not present

## 2013-02-28 NOTE — Addendum Note (Signed)
Addended by: Mena Goes on: 02/28/2013 09:52 AM   Modules accepted: Orders

## 2013-03-01 DIAGNOSIS — M7120 Synovial cyst of popliteal space [Baker], unspecified knee: Secondary | ICD-10-CM | POA: Diagnosis not present

## 2013-03-01 DIAGNOSIS — M25569 Pain in unspecified knee: Secondary | ICD-10-CM | POA: Diagnosis not present

## 2013-03-01 DIAGNOSIS — I824Z9 Acute embolism and thrombosis of unspecified deep veins of unspecified distal lower extremity: Secondary | ICD-10-CM | POA: Diagnosis not present

## 2013-03-01 DIAGNOSIS — Z7901 Long term (current) use of anticoagulants: Secondary | ICD-10-CM | POA: Diagnosis not present

## 2013-03-03 DIAGNOSIS — I824Z9 Acute embolism and thrombosis of unspecified deep veins of unspecified distal lower extremity: Secondary | ICD-10-CM | POA: Diagnosis not present

## 2013-03-03 DIAGNOSIS — Z7901 Long term (current) use of anticoagulants: Secondary | ICD-10-CM | POA: Diagnosis not present

## 2013-03-03 DIAGNOSIS — M25569 Pain in unspecified knee: Secondary | ICD-10-CM | POA: Diagnosis not present

## 2013-03-03 DIAGNOSIS — M7120 Synovial cyst of popliteal space [Baker], unspecified knee: Secondary | ICD-10-CM | POA: Diagnosis not present

## 2013-03-06 DIAGNOSIS — M25569 Pain in unspecified knee: Secondary | ICD-10-CM | POA: Diagnosis not present

## 2013-03-06 DIAGNOSIS — M7120 Synovial cyst of popliteal space [Baker], unspecified knee: Secondary | ICD-10-CM | POA: Diagnosis not present

## 2013-03-06 DIAGNOSIS — I824Z9 Acute embolism and thrombosis of unspecified deep veins of unspecified distal lower extremity: Secondary | ICD-10-CM | POA: Diagnosis not present

## 2013-03-06 DIAGNOSIS — Z7901 Long term (current) use of anticoagulants: Secondary | ICD-10-CM | POA: Diagnosis not present

## 2013-03-08 DIAGNOSIS — Z7901 Long term (current) use of anticoagulants: Secondary | ICD-10-CM | POA: Diagnosis not present

## 2013-03-08 DIAGNOSIS — M25569 Pain in unspecified knee: Secondary | ICD-10-CM | POA: Diagnosis not present

## 2013-03-08 DIAGNOSIS — M7120 Synovial cyst of popliteal space [Baker], unspecified knee: Secondary | ICD-10-CM | POA: Diagnosis not present

## 2013-03-08 DIAGNOSIS — I824Z9 Acute embolism and thrombosis of unspecified deep veins of unspecified distal lower extremity: Secondary | ICD-10-CM | POA: Diagnosis not present

## 2013-03-10 ENCOUNTER — Encounter: Payer: Self-pay | Admitting: Surgery

## 2013-03-10 DIAGNOSIS — Z7901 Long term (current) use of anticoagulants: Secondary | ICD-10-CM | POA: Diagnosis not present

## 2013-03-10 DIAGNOSIS — M7120 Synovial cyst of popliteal space [Baker], unspecified knee: Secondary | ICD-10-CM | POA: Diagnosis not present

## 2013-03-10 DIAGNOSIS — I824Z9 Acute embolism and thrombosis of unspecified deep veins of unspecified distal lower extremity: Secondary | ICD-10-CM | POA: Diagnosis not present

## 2013-03-10 DIAGNOSIS — M25569 Pain in unspecified knee: Secondary | ICD-10-CM | POA: Diagnosis not present

## 2013-03-13 ENCOUNTER — Encounter: Payer: Self-pay | Admitting: Surgery

## 2013-03-13 ENCOUNTER — Ambulatory Visit (HOSPITAL_COMMUNITY)
Admission: RE | Admit: 2013-03-13 | Discharge: 2013-03-13 | Disposition: A | Discharge status: Home / Self Care | Payer: Medicare Other | Source: Ambulatory Visit | Attending: Surgery | Admitting: Surgery

## 2013-03-13 ENCOUNTER — Ambulatory Visit (INDEPENDENT_AMBULATORY_CARE_PROVIDER_SITE_OTHER): Payer: Medicare Other | Admitting: Surgery

## 2013-03-13 VITALS — BP 160/77 | HR 67 | Ht 68.0 in | Wt 175.6 lb

## 2013-03-13 DIAGNOSIS — M7989 Other specified soft tissue disorders: Secondary | ICD-10-CM

## 2013-03-13 DIAGNOSIS — Z7901 Long term (current) use of anticoagulants: Secondary | ICD-10-CM | POA: Diagnosis not present

## 2013-03-13 DIAGNOSIS — M7120 Synovial cyst of popliteal space [Baker], unspecified knee: Secondary | ICD-10-CM | POA: Diagnosis not present

## 2013-03-13 DIAGNOSIS — I82409 Acute embolism and thrombosis of unspecified deep veins of unspecified lower extremity: Secondary | ICD-10-CM

## 2013-03-13 DIAGNOSIS — I82402 Acute embolism and thrombosis of unspecified deep veins of left lower extremity: Secondary | ICD-10-CM

## 2013-03-13 DIAGNOSIS — M25569 Pain in unspecified knee: Secondary | ICD-10-CM | POA: Diagnosis not present

## 2013-03-13 DIAGNOSIS — I824Z9 Acute embolism and thrombosis of unspecified deep veins of unspecified distal lower extremity: Secondary | ICD-10-CM | POA: Diagnosis not present

## 2013-03-13 NOTE — Addendum Note (Signed)
Addended by: Mena Goes on: 03/13/2013 11:43 AM   Modules accepted: Orders

## 2013-03-13 NOTE — Progress Notes (Signed)
[facility]   Patient name: Steven Horton MRN: [id number] DOB: [birth year] Sex: male     Chief Complaint  Patient presents with  . Re-evaluation    2 wk f/u L LE edena    HISTORY OF PRESENT ILLNESS: This is a 79 y.o. male who is was seen here a couple of weeks ago for swelling in his LLE due to DVT. He was placed in a 4 layer Profore boot and is following up today. His compression dressing is being changed 3 times a week by home health. He had a slight skin tear on the lateral side of the left leg 2 weeks ago therefore he was continued and compression wraps.  He's back today for followup.  Past Medical History  Diagnosis Date  . Hypertension   . Stomach ulcer   . Arthritis   . DVT (deep venous thrombosis)     Past Surgical History  Procedure Laterality Date  . Replacement total knee bilateral    . Shoulder surgery    . Joint replacement      History   Social History  . Marital Status: Single    Spouse Name: N/A    Number of Children: N/A  . Years of Education: N/A   Occupational History  . Not on file.   Social History Main Topics  . Smoking status: Former Smoker    Types: Cigarettes    Quit date: 04/27/1978  . Smokeless tobacco: Not on file  . Alcohol Use: 2.4 oz/week    4 Shots of liquor per week  . Drug Use: No  . Sexual Activity: Not on file   Other Topics Concern  . Not on file   Social History Narrative  . No narrative on file    Family History  Problem Relation Age of Onset  . Cancer Sister     Allergies as of 03/13/2013  . (No Known Allergies)    Current Outpatient Prescriptions on File Prior to Visit  Medication Sig Dispense Refill  . aspirin EC 81 MG tablet Take 81 mg by mouth daily.        Marland Kitchen doxycycline (VIBRA-TABS) 100 MG tablet Take 100 mg by mouth 2 (two) times daily.        . fenofibrate 160 MG tablet Take 160 mg by mouth daily.        Marland Kitchen HYDROcodone-acetaminophen (NORCO/VICODIN) 5-325 MG per tablet  Take 1 tablet by mouth every 4 (four) hours as needed for pain.  15 tablet  0  . methylPREDNIsolone (MEDROL DOSPACK) 4 MG tablet       . Multiple Vitamins-Minerals (MULTIVITAMINS THER. W/MINERALS) TABS Take 1 tablet by mouth daily.        Marland Kitchen omeprazole-sodium bicarbonate (ZEGERID) 40-1100 MG per capsule Take 1 capsule by mouth daily before breakfast.      . Rivaroxaban (XARELTO) 15 MG TABS tablet Take 15 mg by mouth 2 (two) times daily with a meal.      . traMADol (ULTRAM) 50 MG tablet Take 50 mg by mouth every 6 (six) hours as needed. Maximum dose= 8 tablets per day.  For pain.       . verapamil (CALAN-SR) 240 MG CR tablet Take 240 mg by mouth 2 (two) times daily.        . verapamil (CALAN-SR) 240 MG CR tablet        No current facility-administered medications on file prior to visit.     REVIEW OF SYSTEMS: No change  from prior visit  PHYSICAL EXAMINATION:   Vital signs are BP 160/77  Pulse 67  Ht 5\' 8"  (1.727 m)  Wt 175 lb 9.6 oz (79.652 kg)  BMI 26.71 kg/m2  SpO2 100% General: The patient appears their stated age. HEENT:  No gross abnormalities Pulmonary:  Non labored breathing Abdomen: Soft and non-tender Musculoskeletal: There are no major deformities. Neurologic: No focal weakness or paresthesias are detected, Skin: There are no ulcer or rashes noted. Psychiatric: The patient has normal affect. Cardiovascular: There is a regular rate and rhythm without significant murmur appreciated.  Residual edema in the left leg with brawny discoloration ulcers were healed   Diagnostic Studies Venous ultrasound was performed today.  This shows no evidence of acute DVT.  There is partially occlusive hyperechoic, weblike thrombus in the left popliteal vein.  Venous incompetence is noted in the right great saphenous vein and bilateral femoral veins.    Assessment: Left leg DVT Plan: All the ulcers on the patient's left leg had healed.  He will be fitted for compression stockings today in  the office.  I'll have him back in 6 months.  I'll repeat his ultrasound.  He will continue anticoagulation for a minimum of 6 months.  This will be directed by his primary care physician.  Eldridge Abrahams, M.D. [facility] [place] Office: [phone] Pager:  [phone]

## 2013-03-14 ENCOUNTER — Encounter (HOSPITAL_COMMUNITY): Payer: Medicare Other

## 2013-03-14 ENCOUNTER — Encounter: Payer: Medicare Other | Admitting: Vascular Surgery

## 2013-04-03 ENCOUNTER — Telehealth: Payer: Self-pay

## 2013-04-03 DIAGNOSIS — M1710 Unilateral primary osteoarthritis, unspecified knee: Secondary | ICD-10-CM | POA: Diagnosis not present

## 2013-04-03 DIAGNOSIS — M25569 Pain in unspecified knee: Secondary | ICD-10-CM | POA: Diagnosis not present

## 2013-04-03 NOTE — Telephone Encounter (Signed)
Discussed pt's. Symptoms with Dr. Kellie Simmering.  Advised that since pt. on Xarelto for the DVT, and the swelling is localized in the knee area, instead of the lower leg, it is unlikely related to DVT.  Advised he may try OTC pain medication, and ice to affected area, but may need to contact his Orthopedic surgeon.  Advised pt. of Dr. Evelena Leyden recommendations; pt. verb. Understanding, and stated he is going to contact his Orthopedic MD.

## 2013-04-03 NOTE — Telephone Encounter (Signed)
Pt. called to report he started having left knee pain and swelling yesterday.  Stated he noticed the pain when he went to stand, and the pain in the left knee was very intense.  Denies any noticeable redness or warmth in the left knee.  Denies swelling below the left knee.  Continues on Xarelto.  States he had a left knee replacement several years ago.  Asking if he needs to see Dr. Trula Slade or his orthopedic MD?  Venous ultrasound on 11/17 noted "partially occlusive hyperechoic weblike thrombus in left popliteal vein."  Will discuss with MD in office today, and call pt. with further recommendations.

## 2013-04-04 DIAGNOSIS — M25469 Effusion, unspecified knee: Secondary | ICD-10-CM | POA: Diagnosis not present

## 2013-04-11 DIAGNOSIS — M25469 Effusion, unspecified knee: Secondary | ICD-10-CM | POA: Diagnosis not present

## 2013-04-25 DIAGNOSIS — M545 Low back pain: Secondary | ICD-10-CM | POA: Diagnosis not present

## 2013-04-26 DIAGNOSIS — M1710 Unilateral primary osteoarthritis, unspecified knee: Secondary | ICD-10-CM | POA: Diagnosis not present

## 2013-04-26 DIAGNOSIS — M25469 Effusion, unspecified knee: Secondary | ICD-10-CM | POA: Diagnosis not present

## 2013-05-02 DIAGNOSIS — M545 Low back pain, unspecified: Secondary | ICD-10-CM | POA: Diagnosis not present

## 2013-05-03 DIAGNOSIS — E7800 Pure hypercholesterolemia, unspecified: Secondary | ICD-10-CM | POA: Diagnosis not present

## 2013-05-03 DIAGNOSIS — I10 Essential (primary) hypertension: Secondary | ICD-10-CM | POA: Diagnosis not present

## 2013-05-03 DIAGNOSIS — L719 Rosacea, unspecified: Secondary | ICD-10-CM | POA: Diagnosis not present

## 2013-05-03 DIAGNOSIS — M545 Low back pain, unspecified: Secondary | ICD-10-CM | POA: Diagnosis not present

## 2013-05-04 DIAGNOSIS — M545 Low back pain, unspecified: Secondary | ICD-10-CM | POA: Diagnosis not present

## 2013-05-09 DIAGNOSIS — M545 Low back pain, unspecified: Secondary | ICD-10-CM | POA: Diagnosis not present

## 2013-05-11 DIAGNOSIS — M545 Low back pain, unspecified: Secondary | ICD-10-CM | POA: Diagnosis not present

## 2013-05-11 DIAGNOSIS — M1710 Unilateral primary osteoarthritis, unspecified knee: Secondary | ICD-10-CM | POA: Diagnosis not present

## 2013-05-17 DIAGNOSIS — M545 Low back pain, unspecified: Secondary | ICD-10-CM | POA: Diagnosis not present

## 2013-05-18 DIAGNOSIS — M545 Low back pain, unspecified: Secondary | ICD-10-CM | POA: Diagnosis not present

## 2013-05-22 DIAGNOSIS — M545 Low back pain, unspecified: Secondary | ICD-10-CM | POA: Diagnosis not present

## 2013-07-03 DIAGNOSIS — L259 Unspecified contact dermatitis, unspecified cause: Secondary | ICD-10-CM | POA: Diagnosis not present

## 2013-07-03 DIAGNOSIS — L719 Rosacea, unspecified: Secondary | ICD-10-CM | POA: Diagnosis not present

## 2013-09-07 DIAGNOSIS — M25569 Pain in unspecified knee: Secondary | ICD-10-CM | POA: Diagnosis not present

## 2013-09-08 ENCOUNTER — Encounter: Payer: Self-pay | Admitting: Surgery

## 2013-09-11 ENCOUNTER — Ambulatory Visit (INDEPENDENT_AMBULATORY_CARE_PROVIDER_SITE_OTHER): Payer: Medicare Other | Admitting: Surgery

## 2013-09-11 ENCOUNTER — Ambulatory Visit (HOSPITAL_COMMUNITY)
Admission: RE | Admit: 2013-09-11 | Discharge: 2013-09-11 | Disposition: A | Discharge status: Home / Self Care | Payer: Medicare Other | Source: Ambulatory Visit | Attending: Surgery | Admitting: Surgery

## 2013-09-11 ENCOUNTER — Encounter: Payer: Self-pay | Admitting: Surgery

## 2013-09-11 VITALS — BP 132/84 | HR 77 | Ht 68.0 in | Wt 181.0 lb

## 2013-09-11 DIAGNOSIS — I82819 Embolism and thrombosis of superficial veins of unspecified lower extremities: Secondary | ICD-10-CM | POA: Diagnosis not present

## 2013-09-11 DIAGNOSIS — I82409 Acute embolism and thrombosis of unspecified deep veins of unspecified lower extremity: Secondary | ICD-10-CM

## 2013-09-11 DIAGNOSIS — I80299 Phlebitis and thrombophlebitis of other deep vessels of unspecified lower extremity: Secondary | ICD-10-CM

## 2013-09-11 DIAGNOSIS — I82402 Acute embolism and thrombosis of unspecified deep veins of left lower extremity: Secondary | ICD-10-CM

## 2013-09-11 DIAGNOSIS — I83893 Varicose veins of bilateral lower extremities with other complications: Secondary | ICD-10-CM | POA: Insufficient documentation

## 2013-09-11 DIAGNOSIS — M7989 Other specified soft tissue disorders: Secondary | ICD-10-CM | POA: Diagnosis not present

## 2013-09-11 NOTE — Progress Notes (Signed)
Patient name: Steven Horton MRN: [id number] DOB: 25-Nov-1930 Sex: male     Chief Complaint  Patient presents with  . Re-evaluation    6 month f/u     HISTORY OF PRESENT ILLNESS: The patient is back today for followup.  In September of 2014, he was diagnosed with a deep vein thrombosis of the left leg.  He was started on Xaralto by Dr. Harrington Challenger.  A venous duplex revealed a large mass in the proximal to mid left calf which was consistent with a probable ruptured Baker's cyst.  The patient developed a wound on his left leg and was placed into compression dressings.  This was ultimately converted to compression stockings which he has been wearing religiously.  He ran out of his anti-coagulation approximately one month ago, and this was not refill.  He has no complaints today.  He did have fluid off of his knee drained last week.  Past Medical History  Diagnosis Date  . Hypertension   . Stomach ulcer   . Arthritis   . DVT (deep venous thrombosis)     Past Surgical History  Procedure Laterality Date  . Replacement total knee bilateral    . Shoulder surgery    . Joint replacement      History   Social History  . Marital Status: Single    Spouse Name: N/A    Number of Children: N/A  . Years of Education: N/A   Occupational History  . Not on file.   Social History Main Topics  . Smoking status: Former Smoker    Types: Cigarettes    Quit date: 04/27/1978  . Smokeless tobacco: Not on file  . Alcohol Use: 2.4 oz/week    4 Shots of liquor per week  . Drug Use: No  . Sexual Activity: Not on file   Other Topics Concern  . Not on file   Social History Narrative  . No narrative on file    Family History  Problem Relation Age of Onset  . Cancer Sister     Allergies as of 09/11/2013  . (No Known Allergies)    Current Outpatient Prescriptions on File Prior to Visit  Medication Sig Dispense Refill  . aspirin EC 81 MG tablet Take 81 mg by mouth daily.        Marland Kitchen  doxycycline (VIBRA-TABS) 100 MG tablet Take 100 mg by mouth 2 (two) times daily.        . fenofibrate 160 MG tablet Take 160 mg by mouth daily.        . methylPREDNIsolone (MEDROL DOSPACK) 4 MG tablet       . Multiple Vitamins-Minerals (MULTIVITAMINS THER. W/MINERALS) TABS Take 1 tablet by mouth daily.        . traMADol (ULTRAM) 50 MG tablet Take 50 mg by mouth every 6 (six) hours as needed. Maximum dose= 8 tablets per day.  For pain.       . verapamil (CALAN-SR) 240 MG CR tablet Take 240 mg by mouth 2 (two) times daily.        . verapamil (CALAN-SR) 240 MG CR tablet       . HYDROcodone-acetaminophen (NORCO/VICODIN) 5-325 MG per tablet Take 1 tablet by mouth every 4 (four) hours as needed for pain.  15 tablet  0  . omeprazole-sodium bicarbonate (ZEGERID) 40-1100 MG per capsule Take 1 capsule by mouth daily before breakfast.      . Rivaroxaban (XARELTO) 15 MG TABS tablet Take 15 mg  by mouth 2 (two) times daily with a meal.       No current facility-administered medications on file prior to visit.     REVIEW OF SYSTEMS: Please see history of present illness, otherwise all systems negative  PHYSICAL EXAMINATION:   Vital signs are BP 132/84  Pulse 77  Ht 5\' 8"  (1.727 m)  Wt 181 lb (82.101 kg)  BMI 27.53 kg/m2  SpO2 97% General: The patient appears their stated age. HEENT:  No gross abnormalities Pulmonary:  Non labored breathing Musculoskeletal: There are no major deformities. Neurologic: No focal weakness or paresthesias are detected, Skin: Ulcers have healed Psychiatric: The patient has normal affect. Cardiovascular: Significantly decreased edema in the left leg.   Diagnostic Studies Duplex ultrasound was ordered and reviewed.  This shows no evidence of acute deep vein thrombosis on the left side.  There was chronic thrombus within the left popliteal vein.  This is partially obstructing.  There is deep vein reflux.  There is also a segment of the left great saphenous vein with  significant reflux.  Assessment: Left leg DVT Plan: The patient completed an appropriate time frame of anti-coagulation, and is no longer taking anticoagulation.  Ultrasound today shows a chronic thrombus within the left popliteal vein which is partially obstructing.  He has both deep and superficial vein reflux.  Currently, he is doing an adequate job with compression stockings.  I think it is too early to consider laser ablation of the refluxing left great saphenous vein.  This could be considered at a later date.  His symptoms would certainly have to warrant intervention.  I am going to have him followup in 6 months with a repeat venous insufficiency ultrasound to evaluate the partially obstructing thrombus within the left popliteal vein, and to evaluate the reflux in his great saphenous vein to see if it has progressed.  Eldridge Abrahams, M.D. [facility] [place] Office: [phone] Pager:  [phone]

## 2013-09-11 NOTE — Addendum Note (Signed)
Addended by: Mena Goes on: 09/11/2013 03:46 PM   Modules accepted: Orders

## 2013-10-31 DIAGNOSIS — I10 Essential (primary) hypertension: Secondary | ICD-10-CM | POA: Diagnosis not present

## 2013-10-31 DIAGNOSIS — E7800 Pure hypercholesterolemia, unspecified: Secondary | ICD-10-CM | POA: Diagnosis not present

## 2013-10-31 DIAGNOSIS — Z23 Encounter for immunization: Secondary | ICD-10-CM | POA: Diagnosis not present

## 2013-10-31 DIAGNOSIS — N183 Chronic kidney disease, stage 3 unspecified: Secondary | ICD-10-CM | POA: Diagnosis not present

## 2014-02-10 DIAGNOSIS — Z23 Encounter for immunization: Secondary | ICD-10-CM | POA: Diagnosis not present

## 2014-02-19 DIAGNOSIS — M79609 Pain in unspecified limb: Secondary | ICD-10-CM | POA: Diagnosis not present

## 2014-02-19 DIAGNOSIS — L84 Corns and callosities: Secondary | ICD-10-CM | POA: Diagnosis not present

## 2014-02-19 DIAGNOSIS — I739 Peripheral vascular disease, unspecified: Secondary | ICD-10-CM | POA: Diagnosis not present

## 2014-02-19 DIAGNOSIS — I83224 Varicose veins of left lower extremity with both ulcer of heel and midfoot and inflammation: Secondary | ICD-10-CM | POA: Diagnosis not present

## 2014-02-19 DIAGNOSIS — M2570 Osteophyte, unspecified joint: Secondary | ICD-10-CM | POA: Diagnosis not present

## 2014-03-12 DIAGNOSIS — M79672 Pain in left foot: Secondary | ICD-10-CM | POA: Diagnosis not present

## 2014-03-19 ENCOUNTER — Encounter (HOSPITAL_COMMUNITY): Payer: Medicare Other

## 2014-03-19 ENCOUNTER — Ambulatory Visit: Payer: Medicare Other | Admitting: Surgery

## 2014-03-21 ENCOUNTER — Encounter: Payer: Self-pay | Admitting: Surgery

## 2014-03-26 ENCOUNTER — Ambulatory Visit (HOSPITAL_COMMUNITY)
Admission: RE | Admit: 2014-03-26 | Discharge: 2014-03-26 | Disposition: A | Discharge status: Home / Self Care | Payer: Medicare Other | Source: Ambulatory Visit | Attending: Surgery | Admitting: Surgery

## 2014-03-26 ENCOUNTER — Encounter: Payer: Self-pay | Admitting: Surgery

## 2014-03-26 ENCOUNTER — Ambulatory Visit (INDEPENDENT_AMBULATORY_CARE_PROVIDER_SITE_OTHER): Payer: Medicare Other | Admitting: Surgery

## 2014-03-26 VITALS — BP 139/77 | HR 73 | Resp 16 | Ht 67.0 in | Wt 189.0 lb

## 2014-03-26 DIAGNOSIS — I80209 Phlebitis and thrombophlebitis of unspecified deep vessels of unspecified lower extremity: Secondary | ICD-10-CM | POA: Insufficient documentation

## 2014-03-26 DIAGNOSIS — I872 Venous insufficiency (chronic) (peripheral): Secondary | ICD-10-CM | POA: Diagnosis not present

## 2014-03-26 NOTE — Progress Notes (Signed)
[facility] [place] Office Progress Note   HPI: This is a 80 y.o. male who is here for 6 month follow-up. In September of 2014, he was diagnosed with a DVT of the left leg and was started on Xarelto by Dr. Harrington Challenger. He has since completed his anticoagulation. During his last office visit on 09/11/13, he had a chronic, partially obstructing thrombus of his left popliteal vein. His venous duplex exam also revealed left great saphenous reflux. It was felt that at that time, it was too early to consider laser ablation. He is back today for further evaluation of his left great saphenous vein and to evaluate his left popliteal vein thrombus. He continues to wear compression stockings daily. He denies any wounds of his legs. He does complain of left knee pain s/p left knee replacement 15 years ago.   Past Medical History  Diagnosis Date  . Hypertension   . Stomach ulcer   . Arthritis   . DVT (deep venous thrombosis)    Past Surgical History  Procedure Laterality Date  . Replacement total knee bilateral    . Shoulder surgery    . Joint replacement      No Known Allergies  Current Outpatient Prescriptions  Medication Sig Dispense Refill  . aspirin EC 81 MG tablet Take 81 mg by mouth daily.      Marland Kitchen doxycycline (VIBRA-TABS) 100 MG tablet Take 100 mg by mouth 2 (two) times daily.      . fenofibrate 160 MG tablet Take 160 mg by mouth daily.      . Multiple Vitamins-Minerals (MULTIVITAMINS THER. W/MINERALS) TABS Take 1 tablet by mouth daily.      . traMADol (ULTRAM) 50 MG tablet Take 50 mg by mouth every 6 (six) hours as needed. Maximum dose= 8 tablets per day.  For pain.     . verapamil (CALAN-SR) 240 MG CR tablet Take 240 mg by mouth 2 (two) times daily.      Marland Kitchen HYDROcodone-acetaminophen (NORCO/VICODIN) 5-325 MG per tablet Take 1 tablet by mouth every 4 (four) hours as needed for pain. (Patient not taking: Reported on 03/26/2014) 15 tablet 0  . methylPREDNIsolone (MEDROL DOSPACK)  4 MG tablet     . omeprazole-sodium bicarbonate (ZEGERID) 40-1100 MG per capsule Take 1 capsule by mouth daily before breakfast.    . Rivaroxaban (XARELTO) 15 MG TABS tablet Take 15 mg by mouth 2 (two) times daily with a meal.    . verapamil (CALAN-SR) 240 MG CR tablet      No current facility-administered medications for this visit.    Family History  Problem Relation Age of Onset  . Cancer Sister     History   Social History  . Marital Status: Single    Spouse Name: N/A    Number of Children: N/A  . Years of Education: N/A   Occupational History  . Not on file.   Social History Main Topics  . Smoking status: Former Smoker    Types: Cigarettes    Quit date: 04/27/1978  . Smokeless tobacco: Never Used  . Alcohol Use: 2.4 oz/week    4 Shots of liquor per week  . Drug Use: No  . Sexual Activity: Not on file   Other Topics Concern  . Not on file   Social History Narrative     ROS: [x]  Positive   [ ]  Negative   [ ]  All sytems reviewed and are negative  Cardiovascular: []  chest pain/pressure []  palpitations []   SOB lying flat []  DOE []  pain in legs while walking []  pain in feet when lying flat [x]  hx of DVT []  hx of phlebitis [x]  swelling in legs []  varicose veins  Pulmonary: []  productive cough []  asthma []  wheezing  Neurologic: []  weakness in []  arms []  legs []  numbness in []  arms []  legs [] difficulty speaking or slurred speech []  temporary loss of vision in one eye []  dizziness  Hematologic: []  bleeding problems []  problems with blood clotting easily  GI []  vomiting blood []  blood in stool  GU: []  burning with urination []  blood in urine  Psychiatric: []  hx of major depression  Integumentary: []  rashes []  ulcers  Constitutional: []  fever []  chills   PHYSICAL EXAMINATION:  Filed Vitals:   03/26/14 1616  BP: 139/77  Pulse: 73  Resp: 16   Body mass index is 29.59 kg/(m^2).  General:  WDWN elderly male in NAD HENT: WNL,  normocephalic Pulmonary: normal non-labored breathing , without rales, rhonchi,  wheezing Cardiac: RRR, without  Murmurs, rubs or gallops; without carotid bruits Vascular Exam/Pulses: 1+ left DP pulse, 2+ edema left lower leg. Chronic venous stasis skin changes with hemosiderin deposits.  Extremities: without ischemic changes, without Gangrene , without cellulitis; without open wounds;  Neurologic: A&O X 3; Appropriate Affect ; SENSATION: normal; MOTOR FUNCTION:  moving all extremities equally. Speech is fluent/normal   Non-Invasive Vascular Imaging:   Lower Extremity Venous Duplex Reflux (03/26/14)  Chronic non-occlusive deep vein thrombosis in left popliteal vein  Deep venous reflux of left common femoral, popliteal and posterior tibial veins  Competent left great and small saphenous venous systems.    ASSESSMENT/PLAN: 80 y.o. male with chronic non-occlusive deep vein thrombosis of left popliteal vein which is consistent with his duplex exam from 09/11/13.  His current venous reflux today reveal no great saphenous vein reflux which was different from his previous exam from 09/11/13. He continues to have some swelling of left leg but denies any venous stasis ulcers. Recommended continued use of compression stockings. He will follow up on an as needed basis.   Virgina Jock, PA-C [facility] [phone]  Clinic MD:  Pt seen and examined in conjunction with Dr. Trula Slade  I agree with the above.  I have seen and evaluated the patient.  He has a history of a nonocclusive deep vein thrombosis in the left popliteal artery.  He has been appropriately treated with Coumadin.  His most recent ultrasound showed great saphenous vein reflux he is here for further evaluation.  Ultrasound today did not see any superficial venous reflux.  Therefore, I have recommended that he continue with compression for his edema.  He will call me if he develops a new wound.  Annamarie Major

## 2014-05-08 DIAGNOSIS — Z96651 Presence of right artificial knee joint: Secondary | ICD-10-CM | POA: Diagnosis not present

## 2014-05-08 DIAGNOSIS — Z96652 Presence of left artificial knee joint: Secondary | ICD-10-CM | POA: Diagnosis not present

## 2014-05-28 DIAGNOSIS — I739 Peripheral vascular disease, unspecified: Secondary | ICD-10-CM | POA: Diagnosis not present

## 2014-05-28 DIAGNOSIS — L603 Nail dystrophy: Secondary | ICD-10-CM | POA: Diagnosis not present

## 2014-07-26 DIAGNOSIS — H1133 Conjunctival hemorrhage, bilateral: Secondary | ICD-10-CM | POA: Diagnosis not present

## 2014-08-02 DIAGNOSIS — Z09 Encounter for follow-up examination after completed treatment for conditions other than malignant neoplasm: Secondary | ICD-10-CM | POA: Diagnosis not present

## 2014-08-02 DIAGNOSIS — Z96652 Presence of left artificial knee joint: Secondary | ICD-10-CM | POA: Diagnosis not present

## 2014-08-02 DIAGNOSIS — Z96651 Presence of right artificial knee joint: Secondary | ICD-10-CM | POA: Diagnosis not present

## 2014-08-06 DIAGNOSIS — I739 Peripheral vascular disease, unspecified: Secondary | ICD-10-CM | POA: Diagnosis not present

## 2014-08-06 DIAGNOSIS — L603 Nail dystrophy: Secondary | ICD-10-CM | POA: Diagnosis not present

## 2014-10-03 DIAGNOSIS — L719 Rosacea, unspecified: Secondary | ICD-10-CM | POA: Diagnosis not present

## 2015-01-02 DIAGNOSIS — Z09 Encounter for follow-up examination after completed treatment for conditions other than malignant neoplasm: Secondary | ICD-10-CM | POA: Diagnosis not present

## 2015-01-02 DIAGNOSIS — M7051 Other bursitis of knee, right knee: Secondary | ICD-10-CM | POA: Diagnosis not present

## 2015-01-02 DIAGNOSIS — Z96652 Presence of left artificial knee joint: Secondary | ICD-10-CM | POA: Diagnosis not present

## 2015-01-02 DIAGNOSIS — Z96651 Presence of right artificial knee joint: Secondary | ICD-10-CM | POA: Diagnosis not present

## 2015-01-14 DIAGNOSIS — E780 Pure hypercholesterolemia: Secondary | ICD-10-CM | POA: Diagnosis not present

## 2015-01-14 DIAGNOSIS — Z23 Encounter for immunization: Secondary | ICD-10-CM | POA: Diagnosis not present

## 2015-01-14 DIAGNOSIS — I10 Essential (primary) hypertension: Secondary | ICD-10-CM | POA: Diagnosis not present

## 2015-01-14 DIAGNOSIS — T148 Other injury of unspecified body region: Secondary | ICD-10-CM | POA: Diagnosis not present

## 2015-05-02 DIAGNOSIS — M25562 Pain in left knee: Secondary | ICD-10-CM | POA: Diagnosis not present

## 2015-05-02 DIAGNOSIS — M25561 Pain in right knee: Secondary | ICD-10-CM | POA: Diagnosis not present

## 2015-05-15 DIAGNOSIS — L308 Other specified dermatitis: Secondary | ICD-10-CM | POA: Diagnosis not present

## 2015-08-06 DIAGNOSIS — H25011 Cortical age-related cataract, right eye: Secondary | ICD-10-CM | POA: Diagnosis not present

## 2015-08-06 DIAGNOSIS — H25012 Cortical age-related cataract, left eye: Secondary | ICD-10-CM | POA: Diagnosis not present

## 2015-08-06 DIAGNOSIS — H2511 Age-related nuclear cataract, right eye: Secondary | ICD-10-CM | POA: Diagnosis not present

## 2015-08-06 DIAGNOSIS — H2512 Age-related nuclear cataract, left eye: Secondary | ICD-10-CM | POA: Diagnosis not present

## 2015-10-08 DIAGNOSIS — M25562 Pain in left knee: Secondary | ICD-10-CM | POA: Diagnosis not present

## 2015-10-08 DIAGNOSIS — M25561 Pain in right knee: Secondary | ICD-10-CM | POA: Diagnosis not present

## 2015-10-14 DIAGNOSIS — H2512 Age-related nuclear cataract, left eye: Secondary | ICD-10-CM | POA: Diagnosis not present

## 2015-10-15 DIAGNOSIS — H25011 Cortical age-related cataract, right eye: Secondary | ICD-10-CM | POA: Diagnosis not present

## 2015-10-15 DIAGNOSIS — H2511 Age-related nuclear cataract, right eye: Secondary | ICD-10-CM | POA: Diagnosis not present

## 2015-10-15 DIAGNOSIS — H25041 Posterior subcapsular polar age-related cataract, right eye: Secondary | ICD-10-CM | POA: Diagnosis not present

## 2015-11-04 DIAGNOSIS — H25041 Posterior subcapsular polar age-related cataract, right eye: Secondary | ICD-10-CM | POA: Diagnosis not present

## 2015-11-04 DIAGNOSIS — H25011 Cortical age-related cataract, right eye: Secondary | ICD-10-CM | POA: Diagnosis not present

## 2015-11-04 DIAGNOSIS — H2511 Age-related nuclear cataract, right eye: Secondary | ICD-10-CM | POA: Diagnosis not present

## 2016-01-21 DIAGNOSIS — Z23 Encounter for immunization: Secondary | ICD-10-CM | POA: Diagnosis not present

## 2016-02-07 DIAGNOSIS — Z77090 Contact with and (suspected) exposure to asbestos: Secondary | ICD-10-CM | POA: Diagnosis not present

## 2016-02-07 DIAGNOSIS — E7800 Pure hypercholesterolemia, unspecified: Secondary | ICD-10-CM | POA: Diagnosis not present

## 2016-02-07 DIAGNOSIS — Z0000 Encounter for general adult medical examination without abnormal findings: Secondary | ICD-10-CM | POA: Diagnosis not present

## 2016-02-07 DIAGNOSIS — R351 Nocturia: Secondary | ICD-10-CM | POA: Diagnosis not present

## 2016-02-07 DIAGNOSIS — I10 Essential (primary) hypertension: Secondary | ICD-10-CM | POA: Diagnosis not present

## 2016-05-10 DIAGNOSIS — R509 Fever, unspecified: Secondary | ICD-10-CM | POA: Diagnosis not present

## 2016-05-10 DIAGNOSIS — J22 Unspecified acute lower respiratory infection: Secondary | ICD-10-CM | POA: Diagnosis not present

## 2016-05-18 DIAGNOSIS — R05 Cough: Secondary | ICD-10-CM | POA: Diagnosis not present

## 2016-05-28 DIAGNOSIS — M25561 Pain in right knee: Secondary | ICD-10-CM | POA: Diagnosis not present

## 2016-05-28 DIAGNOSIS — M25562 Pain in left knee: Secondary | ICD-10-CM | POA: Diagnosis not present

## 2016-06-05 DIAGNOSIS — J189 Pneumonia, unspecified organism: Secondary | ICD-10-CM | POA: Diagnosis not present

## 2016-07-03 DIAGNOSIS — R938 Abnormal findings on diagnostic imaging of other specified body structures: Secondary | ICD-10-CM | POA: Diagnosis not present

## 2016-07-07 DIAGNOSIS — R609 Edema, unspecified: Secondary | ICD-10-CM | POA: Diagnosis not present

## 2016-07-07 DIAGNOSIS — I700 Atherosclerosis of aorta: Secondary | ICD-10-CM | POA: Diagnosis not present

## 2016-07-07 DIAGNOSIS — M79606 Pain in leg, unspecified: Secondary | ICD-10-CM | POA: Diagnosis not present

## 2016-07-07 DIAGNOSIS — R600 Localized edema: Secondary | ICD-10-CM | POA: Diagnosis not present

## 2016-10-12 DIAGNOSIS — L578 Other skin changes due to chronic exposure to nonionizing radiation: Secondary | ICD-10-CM | POA: Diagnosis not present

## 2016-10-12 DIAGNOSIS — L570 Actinic keratosis: Secondary | ICD-10-CM | POA: Diagnosis not present

## 2016-12-29 DIAGNOSIS — Z96651 Presence of right artificial knee joint: Secondary | ICD-10-CM | POA: Diagnosis not present

## 2016-12-29 DIAGNOSIS — M25561 Pain in right knee: Secondary | ICD-10-CM | POA: Diagnosis not present

## 2016-12-29 DIAGNOSIS — M25562 Pain in left knee: Secondary | ICD-10-CM | POA: Diagnosis not present

## 2016-12-29 DIAGNOSIS — Z96652 Presence of left artificial knee joint: Secondary | ICD-10-CM | POA: Diagnosis not present

## 2017-01-12 NOTE — Pre-Procedure Instructions (Signed)
[facility]  01/12/2017      [id number] - [address] [place] [address] Phone: [phone] Fax: [phone]    Your procedure is scheduled on January 15, 2017.  Report to [facility] Admitting at 800 AM.  Call this number if you have problems the morning of surgery:  [phone]   Remember:  Do not eat food or drink liquids after midnight.  Take these medicines the morning of surgery with A SIP OF WATER doxycycline (Vibra), docusate sodium (colace), tramadol (ultram)-if needed for pain.  7 days prior to surgery STOP taking any Aspirin, Aleve, Naproxen, Ibuprofen, Motrin, Advil, Goody's, BC's, all herbal medications, fish oil, and all vitamins   Do not wear jewelry, make-up or nail polish.  Do not wear lotions, powders, or colognes, or deoderant.  Men may shave face and neck.  Do not bring valuables to the hospital.  [facility] is not responsible for any belongings or valuables.  Contacts, dentures or bridgework may not be worn into surgery.  Leave your suitcase in the car.  After surgery it may be brought to your room.  For patients admitted to the hospital, discharge time will be determined by your treatment team.  Patients discharged the day of surgery will not be allowed to drive home.   Special instructions:   [facility]- Preparing For Surgery  Before surgery, you can play an important role. Because skin is not sterile, your skin needs to be as free of germs as possible. You can reduce the number of germs on your skin by washing with CHG (chlorahexidine gluconate) Soap before surgery.  CHG is an antiseptic cleaner which kills germs and bonds with the skin to continue killing germs even after washing.  Please do not use if you have an allergy to CHG or antibacterial soaps. If your skin becomes reddened/irritated stop using the CHG.  Do not shave (including  legs and underarms) for at least 48 hours prior to first CHG shower. It is OK to shave your face.  Please follow these instructions carefully.   1. Shower the NIGHT BEFORE SURGERY and the MORNING OF SURGERY with CHG.   2. If you chose to wash your hair, wash your hair first as usual with your normal shampoo.  3. After you shampoo, rinse your hair and body thoroughly to remove the shampoo.  4. Use CHG as you would any other liquid soap. You can apply CHG directly to the skin and wash gently with a scrungie or a clean washcloth.   5. Apply the CHG Soap to your body ONLY FROM THE NECK DOWN.  Do not use on open wounds or open sores. Avoid contact with your eyes, ears, mouth and genitals (private parts). Wash genitals (private parts) with your normal soap.  6. Wash thoroughly, paying special attention to the area where your surgery will be performed.  7. Thoroughly rinse your body with warm water from the neck down.  8. DO NOT shower/wash with your normal soap after using and rinsing off the CHG Soap.  9. Pat yourself dry with a CLEAN TOWEL.   10. Wear CLEAN PAJAMAS   11. Place CLEAN SHEETS on your bed the night of your first shower and DO NOT SLEEP WITH PETS.    Day of Surgery: Do not apply any deodorants/lotions. Please wear clean clothes to the hospital/surgery center.  Please read over the following fact sheets that you were given. Pain Booklet, Coughing and Deep Breathing and Surgical Site Infection Prevention

## 2017-01-13 ENCOUNTER — Ambulatory Visit (HOSPITAL_COMMUNITY)
Admission: RE | Admit: 2017-01-13 | Discharge: 2017-01-13 | Disposition: A | Discharge status: Home / Self Care | Payer: Medicare Other | Source: Ambulatory Visit | Attending: Orthopedic Surgery | Admitting: Orthopedic Surgery

## 2017-01-13 ENCOUNTER — Encounter (HOSPITAL_COMMUNITY): Payer: Self-pay

## 2017-01-13 ENCOUNTER — Encounter (HOSPITAL_COMMUNITY)
Admission: RE | Admit: 2017-01-13 | Discharge: 2017-01-13 | Disposition: A | Discharge status: Home / Self Care | Payer: Medicare Other | Source: Ambulatory Visit | Attending: Orthopedic Surgery | Admitting: Orthopedic Surgery

## 2017-01-13 DIAGNOSIS — Z01812 Encounter for preprocedural laboratory examination: Secondary | ICD-10-CM

## 2017-01-13 DIAGNOSIS — Z01810 Encounter for preprocedural cardiovascular examination: Secondary | ICD-10-CM

## 2017-01-13 DIAGNOSIS — I739 Peripheral vascular disease, unspecified: Secondary | ICD-10-CM | POA: Diagnosis not present

## 2017-01-13 DIAGNOSIS — Z87891 Personal history of nicotine dependence: Secondary | ICD-10-CM | POA: Diagnosis not present

## 2017-01-13 DIAGNOSIS — Z86718 Personal history of other venous thrombosis and embolism: Secondary | ICD-10-CM | POA: Diagnosis not present

## 2017-01-13 DIAGNOSIS — T84033A Mechanical loosening of internal left knee prosthetic joint, initial encounter: Secondary | ICD-10-CM | POA: Diagnosis not present

## 2017-01-13 DIAGNOSIS — I129 Hypertensive chronic kidney disease with stage 1 through stage 4 chronic kidney disease, or unspecified chronic kidney disease: Secondary | ICD-10-CM | POA: Diagnosis not present

## 2017-01-13 DIAGNOSIS — Z23 Encounter for immunization: Secondary | ICD-10-CM | POA: Diagnosis not present

## 2017-01-13 DIAGNOSIS — T84033D Mechanical loosening of internal left knee prosthetic joint, subsequent encounter: Secondary | ICD-10-CM

## 2017-01-13 DIAGNOSIS — Z01818 Encounter for other preprocedural examination: Secondary | ICD-10-CM

## 2017-01-13 DIAGNOSIS — M1712 Unilateral primary osteoarthritis, left knee: Secondary | ICD-10-CM | POA: Diagnosis not present

## 2017-01-13 DIAGNOSIS — Z471 Aftercare following joint replacement surgery: Secondary | ICD-10-CM | POA: Diagnosis not present

## 2017-01-13 DIAGNOSIS — N189 Chronic kidney disease, unspecified: Secondary | ICD-10-CM | POA: Diagnosis not present

## 2017-01-13 DIAGNOSIS — Z96611 Presence of right artificial shoulder joint: Secondary | ICD-10-CM | POA: Diagnosis not present

## 2017-01-13 DIAGNOSIS — H9190 Unspecified hearing loss, unspecified ear: Secondary | ICD-10-CM | POA: Diagnosis not present

## 2017-01-13 DIAGNOSIS — Z96652 Presence of left artificial knee joint: Secondary | ICD-10-CM | POA: Diagnosis not present

## 2017-01-13 LAB — CBC WITH DIFFERENTIAL/PLATELET
Basophils Absolute: 0 10*3/uL (ref 0.0–0.1)
Basophils Relative: 0 %
Eosinophils Absolute: 0.1 10*3/uL (ref 0.0–0.7)
Eosinophils Relative: 2 %
HCT: 42.8 % (ref 39.0–52.0)
Hemoglobin: 14.3 g/dL (ref 13.0–17.0)
Lymphocytes Relative: 27 %
Lymphs Abs: 1.4 10*3/uL (ref 0.7–4.0)
MCH: 32.5 pg (ref 26.0–34.0)
MCHC: 33.4 g/dL (ref 30.0–36.0)
MCV: 97.3 fL (ref 78.0–100.0)
Monocytes Absolute: 0.5 10*3/uL (ref 0.1–1.0)
Monocytes Relative: 10 %
Neutro Abs: 3.2 10*3/uL (ref 1.7–7.7)
Neutrophils Relative %: 61 %
Platelets: 243 10*3/uL (ref 150–400)
RBC: 4.4 MIL/uL (ref 4.22–5.81)
RDW: 14.5 % (ref 11.5–15.5)
WBC: 5.2 10*3/uL (ref 4.0–10.5)

## 2017-01-13 LAB — URINALYSIS, ROUTINE W REFLEX MICROSCOPIC
BILIRUBIN URINE: NEGATIVE
Glucose, UA: NEGATIVE mg/dL
Hgb urine dipstick: NEGATIVE
KETONES UR: NEGATIVE mg/dL
Leukocytes, UA: NEGATIVE
NITRITE: NEGATIVE
Protein, ur: NEGATIVE mg/dL
Specific Gravity, Urine: 1.017 (ref 1.005–1.030)
pH: 5 (ref 5.0–8.0)

## 2017-01-13 LAB — TYPE AND SCREEN
ABO/RH(D): A POS
Antibody Screen: NEGATIVE

## 2017-01-13 LAB — PROTIME-INR
INR: 1.13
Prothrombin Time: 14.4 seconds (ref 11.4–15.2)

## 2017-01-13 LAB — BASIC METABOLIC PANEL
Anion gap: 12 (ref 5–15)
BUN: 24 mg/dL — ABNORMAL HIGH (ref 6–20)
CO2: 18 mmol/L — ABNORMAL LOW (ref 22–32)
Calcium: 9.3 mg/dL (ref 8.9–10.3)
Chloride: 110 mmol/L (ref 101–111)
Creatinine, Ser: 1.88 mg/dL — ABNORMAL HIGH (ref 0.61–1.24)
GFR calc Af Amer: 36 mL/min — ABNORMAL LOW (ref >60)
GFR calc non Af Amer: 31 mL/min — ABNORMAL LOW (ref >60)
Glucose, Bld: 124 mg/dL — ABNORMAL HIGH (ref 65–99)
Potassium: 4.1 mmol/L (ref 3.5–5.1)
Sodium: 140 mmol/L (ref 135–145)

## 2017-01-13 LAB — APTT: aPTT: 30 seconds (ref 24–36)

## 2017-01-13 LAB — SURGICAL PCR SCREEN
MRSA, PCR: NEGATIVE
Staphylococcus aureus: NEGATIVE

## 2017-01-13 LAB — ABO/RH: ABO/RH(D): A POS

## 2017-01-13 NOTE — Progress Notes (Signed)
PCP - Gus Height Cardiologist - denies  Chest x-ray - 01/13/17 EKG - 01/13/17 Stress Test - denies ECHO - denies Cardiac Cath - denies   Patient denies shortness of breath, fever, cough and chest pain at PAT appointment   Patient verbalized understanding of instructions that were given to them at the PAT appointment. Patient was also instructed that they will need to review over the PAT instructions again at home before surgery.

## 2017-01-14 DIAGNOSIS — T84093A Other mechanical complication of internal left knee prosthesis, initial encounter: Secondary | ICD-10-CM

## 2017-01-14 MED ORDER — LACTATED RINGERS IV SOLN
INTRAVENOUS | Status: DC
  Administered 2017-01-15: 08:00:00 via INTRAVENOUS

## 2017-01-14 MED ORDER — CEFAZOLIN SODIUM-DEXTROSE 2-4 GM/100ML-% IV SOLN
2.0000 g | INTRAVENOUS | Status: AC
  Administered 2017-01-15: 2 g via INTRAVENOUS
  Filled 2017-01-14: qty 100

## 2017-01-14 MED ORDER — BUPIVACAINE LIPOSOME 1.3 % IJ SUSP
20.0000 mL | Freq: Once | INTRAMUSCULAR | Status: AC
  Administered 2017-01-15: 20 mL
  Filled 2017-01-14: qty 20

## 2017-01-14 NOTE — H&P (Signed)
TOTAL KNEE REVISION ADMISSION H&P  Patient is being admitted for left revision total knee arthroplasty.  Subjective:  Chief Complaint:left knee pain.  HPI: Steven Horton, 83 y.o. male, has a history of pain and functional disability in the left knee(s) due to failed previous arthroplasty and patient has failed non-surgical conservative treatments for greater than 12 weeks to include NSAID's and/or analgesics, use of assistive devices and activity modification. The indications for the revision of the total knee arthroplasty are loosening of one or more components and bearing surface wear leading to implant or knee misalignment. Onset of symptoms was gradual starting several years ago with gradually worsening course since that time.  Prior procedures on the left knee(s) include arthroplasty.  Patient currently rates pain in the left knee(s) at 10 out of 10 with activity. There is night pain, worsening of pain with activity and weight bearing, pain that interferes with activities of daily living and pain with passive range of motion.  Patient has evidence of joint space narrowing and prosthetic loosening by imaging studies. This condition presents safety issues increasing the risk of falls.   There is no current active infection.  Patient Active Problem List   Diagnosis Date Noted  . Phlebitis and thrombophlebitis of other deep vessels of lower extremities 02/10/2013  . Leg swelling 02/10/2013  . Pain in joint, lower leg 02/10/2013  . DVT (deep venous thrombosis) ([place]) 02/10/2013  . Baker's cyst, ruptured 02/10/2013   Past Medical History:  Diagnosis Date  . Arthritis   . DVT (deep venous thrombosis) ([place])   . Hypertension   . Stomach ulcer     Past Surgical History:  Procedure Laterality Date  . JOINT REPLACEMENT    . lumps removed     right arm, back,   . REPLACEMENT TOTAL KNEE BILATERAL    . SHOULDER SURGERY     multiple right    No prescriptions prior to admission.   No Known  Allergies  Social History  Substance Use Topics  . Smoking status: Former Smoker    Types: Cigarettes    Quit date: 04/27/1978  . Smokeless tobacco: Never Used  . Alcohol use 2.4 oz/week    4 Shots of liquor per week    Family History  Problem Relation Age of Onset  . Cancer Sister       Review of Systems  Constitutional: Positive for malaise/fatigue.  HENT: Positive for hearing loss and tinnitus.   Eyes: Negative.   Cardiovascular: Positive for leg swelling.       HTN  Gastrointestinal: Negative.   Genitourinary: Positive for frequency.  Musculoskeletal: Positive for joint pain.  Skin: Negative.   Neurological: Negative.   Endo/Heme/Allergies: Bruises/bleeds easily.       History of blood clots  Psychiatric/Behavioral: Negative.      Objective:  Physical Exam  Constitutional: He is oriented to person, place, and time. He appears well-developed and well-nourished.  HENT:  Head: Normocephalic and atraumatic.  Eyes: Pupils are equal, round, and reactive to light.  Neck: Normal range of motion. Neck supple.  Cardiovascular: Intact distal pulses.   Respiratory: Effort normal.  Musculoskeletal: He exhibits tenderness.  He has a range from roughly 5 to 115 bilaterally.  Patient's left knee does have laxity with valgus stress.  Calves are soft and nontender.  Tenderness to the medial aspect of the knee.  Neurological: He is alert and oriented to person, place, and time.  Skin: Skin is warm and dry.  Psychiatric: He has  a normal mood and affect. His behavior is normal. Judgment and thought content normal.    Vital signs in last 24 hours: Temp:  [98 F (36.7 C)] 98 F (36.7 C) (09/19 1257) Pulse Rate:  [83] 83 (09/19 1257) Resp:  [19] 19 (09/19 1257) BP: (131)/(62) 131/62 (09/19 1257) SpO2:  [99 %] 99 % (09/19 1257) Weight:  [83.1 kg (183 lb 3.2 oz)] 83.1 kg (183 lb 3.2 oz) (09/19 1257)  Labs:  Estimated body mass index is 28.69 kg/m as calculated from the  following:   Height as of 03/26/14: 5\' 7"  (1.702 m).   Weight as of 01/13/17: 83.1 kg (183 lb 3.2 oz).  Imaging Review Plain radiographs demonstrate  bilateral AP weightbearing, lateral and sunrise views of bilateral knees are taken and reviewed in office today as well as stress views of the left knee.  Patient's left knee does show migration of the femoral component medially.  We do believe his collateral ligaments are intact.  Assessment/Plan:  End stage arthritis, left knee(s) with failed previous arthroplasty.   The patient history, physical examination, clinical judgment of the provider and imaging studies are consistent with end stage degenerative joint disease of the left knee(s), previous total knee arthroplasty. Revision total knee arthroplasty is deemed medically necessary. The treatment options including medical management, injection therapy, arthroscopy and revision arthroplasty were discussed at length. The risks and benefits of revision total knee arthroplasty were presented and reviewed. The risks due to aseptic loosening, infection, stiffness, patella tracking problems, thromboembolic complications and other imponderables were discussed. The patient acknowledged the explanation, agreed to proceed with the plan and consent was signed. Patient is being admitted for inpatient treatment for surgery, pain control, PT, OT, prophylactic antibiotics, VTE prophylaxis, progressive ambulation and ADL's and discharge planning.The patient is planning to be discharged to skilled nursing facility

## 2017-01-15 ENCOUNTER — Encounter (HOSPITAL_COMMUNITY): Payer: Self-pay | Admitting: *Deleted

## 2017-01-15 ENCOUNTER — Inpatient Hospital Stay (HOSPITAL_COMMUNITY): Payer: Medicare Other

## 2017-01-15 ENCOUNTER — Encounter (HOSPITAL_COMMUNITY)
Admission: RE | Disposition: A | Discharge status: Skilled Nursing Facility | Payer: Self-pay | Source: Home / Self Care | Attending: Orthopedic Surgery

## 2017-01-15 ENCOUNTER — Inpatient Hospital Stay (HOSPITAL_COMMUNITY)
Admission: RE | Admit: 2017-01-15 | Discharge: 2017-01-18 | DRG: 468 | Disposition: A | Discharge status: Skilled Nursing Facility | Payer: Medicare Other | Attending: Orthopedic Surgery | Admitting: Orthopedic Surgery

## 2017-01-15 ENCOUNTER — Inpatient Hospital Stay (HOSPITAL_COMMUNITY): Payer: Medicare Other | Admitting: Certified Registered Nurse Anesthetist

## 2017-01-15 DIAGNOSIS — R41841 Cognitive communication deficit: Secondary | ICD-10-CM | POA: Diagnosis not present

## 2017-01-15 DIAGNOSIS — Z87891 Personal history of nicotine dependence: Secondary | ICD-10-CM | POA: Diagnosis not present

## 2017-01-15 DIAGNOSIS — M1712 Unilateral primary osteoarthritis, left knee: Secondary | ICD-10-CM | POA: Diagnosis present

## 2017-01-15 DIAGNOSIS — T84093A Other mechanical complication of internal left knee prosthesis, initial encounter: Secondary | ICD-10-CM

## 2017-01-15 DIAGNOSIS — N189 Chronic kidney disease, unspecified: Secondary | ICD-10-CM | POA: Diagnosis present

## 2017-01-15 DIAGNOSIS — I10 Essential (primary) hypertension: Secondary | ICD-10-CM | POA: Diagnosis not present

## 2017-01-15 DIAGNOSIS — Z96652 Presence of left artificial knee joint: Secondary | ICD-10-CM

## 2017-01-15 DIAGNOSIS — G8911 Acute pain due to trauma: Secondary | ICD-10-CM | POA: Diagnosis not present

## 2017-01-15 DIAGNOSIS — H9190 Unspecified hearing loss, unspecified ear: Secondary | ICD-10-CM | POA: Diagnosis present

## 2017-01-15 DIAGNOSIS — Z86718 Personal history of other venous thrombosis and embolism: Secondary | ICD-10-CM

## 2017-01-15 DIAGNOSIS — Y832 Surgical operation with anastomosis, bypass or graft as the cause of abnormal reaction of the patient, or of later complication, without mention of misadventure at the time of the procedure: Secondary | ICD-10-CM | POA: Diagnosis present

## 2017-01-15 DIAGNOSIS — I739 Peripheral vascular disease, unspecified: Secondary | ICD-10-CM | POA: Diagnosis present

## 2017-01-15 DIAGNOSIS — E559 Vitamin D deficiency, unspecified: Secondary | ICD-10-CM | POA: Diagnosis not present

## 2017-01-15 DIAGNOSIS — N429 Disorder of prostate, unspecified: Secondary | ICD-10-CM | POA: Diagnosis not present

## 2017-01-15 DIAGNOSIS — G8918 Other acute postprocedural pain: Secondary | ICD-10-CM | POA: Diagnosis not present

## 2017-01-15 DIAGNOSIS — S8990XA Unspecified injury of unspecified lower leg, initial encounter: Secondary | ICD-10-CM | POA: Diagnosis not present

## 2017-01-15 DIAGNOSIS — K5900 Constipation, unspecified: Secondary | ICD-10-CM | POA: Diagnosis not present

## 2017-01-15 DIAGNOSIS — Z111 Encounter for screening for respiratory tuberculosis: Secondary | ICD-10-CM | POA: Diagnosis not present

## 2017-01-15 DIAGNOSIS — I129 Hypertensive chronic kidney disease with stage 1 through stage 4 chronic kidney disease, or unspecified chronic kidney disease: Secondary | ICD-10-CM | POA: Diagnosis present

## 2017-01-15 DIAGNOSIS — Z96651 Presence of right artificial knee joint: Secondary | ICD-10-CM | POA: Diagnosis not present

## 2017-01-15 DIAGNOSIS — R52 Pain, unspecified: Secondary | ICD-10-CM | POA: Diagnosis not present

## 2017-01-15 DIAGNOSIS — T84033A Mechanical loosening of internal left knee prosthetic joint, initial encounter: Secondary | ICD-10-CM | POA: Diagnosis present

## 2017-01-15 DIAGNOSIS — Z471 Aftercare following joint replacement surgery: Secondary | ICD-10-CM | POA: Diagnosis not present

## 2017-01-15 DIAGNOSIS — Z23 Encounter for immunization: Secondary | ICD-10-CM

## 2017-01-15 DIAGNOSIS — I8002 Phlebitis and thrombophlebitis of superficial vessels of left lower extremity: Secondary | ICD-10-CM | POA: Diagnosis not present

## 2017-01-15 DIAGNOSIS — M6281 Muscle weakness (generalized): Secondary | ICD-10-CM | POA: Diagnosis not present

## 2017-01-15 DIAGNOSIS — R278 Other lack of coordination: Secondary | ICD-10-CM | POA: Diagnosis not present

## 2017-01-15 DIAGNOSIS — I82409 Acute embolism and thrombosis of unspecified deep veins of unspecified lower extremity: Secondary | ICD-10-CM | POA: Diagnosis not present

## 2017-01-15 DIAGNOSIS — R262 Difficulty in walking, not elsewhere classified: Secondary | ICD-10-CM | POA: Diagnosis not present

## 2017-01-15 HISTORY — PX: TOTAL KNEE REVISION: SHX996

## 2017-01-15 HISTORY — DX: Unspecified hearing loss, unspecified ear: H91.90

## 2017-01-15 SURGERY — TOTAL KNEE REVISION
Anesthesia: Spinal | Laterality: Left

## 2017-01-15 MED ORDER — KCL IN DEXTROSE-NACL 20-5-0.45 MEQ/L-%-% IV SOLN
INTRAVENOUS | Status: DC
  Administered 2017-01-15 – 2017-01-17 (×2): via INTRAVENOUS
  Filled 2017-01-15 (×6): qty 1000

## 2017-01-15 MED ORDER — ASPIRIN EC 325 MG PO TBEC: 325.0000 mg | DELAYED_RELEASE_TABLET | Freq: Two times a day (BID) | ORAL | 0 refills | Status: DC

## 2017-01-15 MED ORDER — LACTATED RINGERS IV SOLN
INTRAVENOUS | Status: DC | PRN
  Administered 2017-01-15 (×2): via INTRAVENOUS

## 2017-01-15 MED ORDER — BUPIVACAINE IN DEXTROSE 0.75-8.25 % IT SOLN
INTRATHECAL | Status: DC | PRN
  Administered 2017-01-15: 1.6 mL via INTRATHECAL

## 2017-01-15 MED ORDER — MIDAZOLAM HCL 2 MG/2ML IJ SOLN
INTRAMUSCULAR | Status: AC
  Filled 2017-01-15: qty 2

## 2017-01-15 MED ORDER — FLEET ENEMA 7-19 GM/118ML RE ENEM: 1.0000 | ENEMA | Freq: Once | RECTAL | Status: DC | PRN

## 2017-01-15 MED ORDER — DEXAMETHASONE SODIUM PHOSPHATE 10 MG/ML IJ SOLN
10.0000 mg | Freq: Once | INTRAMUSCULAR | Status: AC
  Administered 2017-01-16: 10 mg via INTRAVENOUS
  Filled 2017-01-15: qty 1

## 2017-01-15 MED ORDER — METHOCARBAMOL 500 MG PO TABS: 500.0000 mg | ORAL_TABLET | Freq: Four times a day (QID) | ORAL | Status: DC | PRN

## 2017-01-15 MED ORDER — PHENOL 1.4 % MT LIQD: 1.0000 | OROMUCOSAL | Status: DC | PRN

## 2017-01-15 MED ORDER — CHLORHEXIDINE GLUCONATE 4 % EX LIQD: 60.0000 mL | Freq: Once | CUTANEOUS | Status: DC

## 2017-01-15 MED ORDER — ASPIRIN EC 325 MG PO TBEC
325.0000 mg | DELAYED_RELEASE_TABLET | Freq: Every day | ORAL | Status: DC
  Administered 2017-01-16 – 2017-01-18 (×3): 325 mg via ORAL
  Filled 2017-01-15 (×3): qty 1

## 2017-01-15 MED ORDER — LIDOCAINE 2% (20 MG/ML) 5 ML SYRINGE
INTRAMUSCULAR | Status: AC
  Filled 2017-01-15: qty 5

## 2017-01-15 MED ORDER — VERAPAMIL HCL ER 240 MG PO TBCR
240.0000 mg | EXTENDED_RELEASE_TABLET | Freq: Two times a day (BID) | ORAL | Status: DC
  Administered 2017-01-15 – 2017-01-18 (×7): 240 mg via ORAL
  Filled 2017-01-15 (×7): qty 1

## 2017-01-15 MED ORDER — PROPOFOL 10 MG/ML IV BOLUS
INTRAVENOUS | Status: AC
  Filled 2017-01-15: qty 20

## 2017-01-15 MED ORDER — BUPIVACAINE-EPINEPHRINE (PF) 0.25% -1:200000 IJ SOLN
INTRAMUSCULAR | Status: DC | PRN
  Administered 2017-01-15: 50 mL

## 2017-01-15 MED ORDER — TRANEXAMIC ACID 1000 MG/10ML IV SOLN
2000.0000 mg | INTRAVENOUS | Status: AC
  Administered 2017-01-15: 2000 mg via TOPICAL
  Filled 2017-01-15: qty 20

## 2017-01-15 MED ORDER — MENTHOL 3 MG MT LOZG: 1.0000 | LOZENGE | OROMUCOSAL | Status: DC | PRN

## 2017-01-15 MED ORDER — OXYCODONE HCL 5 MG PO TABS: 5.0000 mg | ORAL_TABLET | ORAL | Status: DC | PRN

## 2017-01-15 MED ORDER — FENTANYL CITRATE (PF) 250 MCG/5ML IJ SOLN
INTRAMUSCULAR | Status: AC
  Filled 2017-01-15: qty 5

## 2017-01-15 MED ORDER — FENOFIBRATE 160 MG PO TABS
160.0000 mg | ORAL_TABLET | Freq: Every day | ORAL | Status: DC
  Administered 2017-01-15 – 2017-01-18 (×4): 160 mg via ORAL
  Filled 2017-01-15 (×4): qty 1

## 2017-01-15 MED ORDER — TAMSULOSIN HCL 0.4 MG PO CAPS
0.4000 mg | ORAL_CAPSULE | Freq: Every evening | ORAL | Status: DC
  Administered 2017-01-15 – 2017-01-18 (×4): 0.4 mg via ORAL
  Filled 2017-01-15 (×4): qty 1

## 2017-01-15 MED ORDER — METHOCARBAMOL 1000 MG/10ML IJ SOLN
500.0000 mg | Freq: Four times a day (QID) | INTRAVENOUS | Status: DC | PRN
  Filled 2017-01-15: qty 5

## 2017-01-15 MED ORDER — TRANEXAMIC ACID 1000 MG/10ML IV SOLN
1000.0000 mg | Freq: Once | INTRAVENOUS | Status: AC
  Administered 2017-01-15: 1000 mg via INTRAVENOUS
  Filled 2017-01-15 (×2): qty 10

## 2017-01-15 MED ORDER — DEXAMETHASONE SODIUM PHOSPHATE 10 MG/ML IJ SOLN
INTRAMUSCULAR | Status: DC | PRN
  Administered 2017-01-15: 10 mg via INTRAVENOUS

## 2017-01-15 MED ORDER — ONDANSETRON HCL 4 MG/2ML IJ SOLN
INTRAMUSCULAR | Status: DC | PRN
  Administered 2017-01-15: 4 mg via INTRAVENOUS

## 2017-01-15 MED ORDER — ACETAMINOPHEN 325 MG PO TABS
650.0000 mg | ORAL_TABLET | Freq: Four times a day (QID) | ORAL | Status: DC | PRN
  Administered 2017-01-15: 650 mg via ORAL
  Filled 2017-01-15: qty 2

## 2017-01-15 MED ORDER — PROPOFOL 500 MG/50ML IV EMUL
INTRAVENOUS | Status: DC | PRN
  Administered 2017-01-15: 50 ug/kg/min via INTRAVENOUS

## 2017-01-15 MED ORDER — BISACODYL 5 MG PO TBEC: 5.0000 mg | DELAYED_RELEASE_TABLET | Freq: Every day | ORAL | Status: DC | PRN

## 2017-01-15 MED ORDER — GABAPENTIN 300 MG PO CAPS
300.0000 mg | ORAL_CAPSULE | Freq: Three times a day (TID) | ORAL | Status: DC
  Administered 2017-01-15 – 2017-01-18 (×11): 300 mg via ORAL
  Filled 2017-01-15 (×11): qty 1

## 2017-01-15 MED ORDER — SENNOSIDES-DOCUSATE SODIUM 8.6-50 MG PO TABS: 1.0000 | ORAL_TABLET | Freq: Every evening | ORAL | Status: DC | PRN

## 2017-01-15 MED ORDER — DEXAMETHASONE SODIUM PHOSPHATE 10 MG/ML IJ SOLN
INTRAMUSCULAR | Status: AC
  Filled 2017-01-15: qty 1

## 2017-01-15 MED ORDER — ONDANSETRON HCL 4 MG/2ML IJ SOLN
INTRAMUSCULAR | Status: AC
  Filled 2017-01-15: qty 2

## 2017-01-15 MED ORDER — DOCUSATE SODIUM 100 MG PO CAPS
100.0000 mg | ORAL_CAPSULE | Freq: Every evening | ORAL | Status: DC
  Administered 2017-01-15 – 2017-01-18 (×4): 100 mg via ORAL
  Filled 2017-01-15 (×4): qty 1

## 2017-01-15 MED ORDER — DIPHENHYDRAMINE HCL 12.5 MG/5ML PO ELIX: 12.5000 mg | ORAL_SOLUTION | ORAL | Status: DC | PRN

## 2017-01-15 MED ORDER — TIZANIDINE HCL 2 MG PO TABS: 2.0000 mg | ORAL_TABLET | Freq: Four times a day (QID) | ORAL | 0 refills | Status: DC | PRN

## 2017-01-15 MED ORDER — HYDROMORPHONE HCL 1 MG/ML IJ SOLN: 0.5000 mg | INTRAMUSCULAR | Status: DC | PRN

## 2017-01-15 MED ORDER — ACETAMINOPHEN 650 MG RE SUPP: 650.0000 mg | Freq: Four times a day (QID) | RECTAL | Status: DC | PRN

## 2017-01-15 MED ORDER — FENTANYL CITRATE (PF) 100 MCG/2ML IJ SOLN
INTRAMUSCULAR | Status: DC | PRN
  Administered 2017-01-15 (×2): 50 ug via INTRAVENOUS

## 2017-01-15 MED ORDER — ONDANSETRON HCL 4 MG PO TABS: 4.0000 mg | ORAL_TABLET | Freq: Four times a day (QID) | ORAL | Status: DC | PRN

## 2017-01-15 MED ORDER — FENTANYL CITRATE (PF) 100 MCG/2ML IJ SOLN
INTRAMUSCULAR | Status: AC
  Administered 2017-01-15: 100 ug
  Filled 2017-01-15: qty 2

## 2017-01-15 MED ORDER — ALUM & MAG HYDROXIDE-SIMETH 200-200-20 MG/5ML PO SUSP: 30.0000 mL | ORAL | Status: DC | PRN

## 2017-01-15 MED ORDER — PHENYLEPHRINE HCL 10 MG/ML IJ SOLN
INTRAVENOUS | Status: DC | PRN
  Administered 2017-01-15: 20 ug/min via INTRAVENOUS

## 2017-01-15 MED ORDER — SODIUM CHLORIDE 0.9 % IR SOLN
Status: DC | PRN
  Administered 2017-01-15: 1000 mL

## 2017-01-15 MED ORDER — BUPIVACAINE-EPINEPHRINE 0.25% -1:200000 IJ SOLN
INTRAMUSCULAR | Status: AC
  Filled 2017-01-15: qty 1

## 2017-01-15 MED ORDER — HYDROCODONE-ACETAMINOPHEN 5-325 MG PO TABS: 1.0000 | ORAL_TABLET | ORAL | 0 refills | Status: DC | PRN

## 2017-01-15 MED ORDER — METOCLOPRAMIDE HCL 5 MG/ML IJ SOLN: 5.0000 mg | Freq: Three times a day (TID) | INTRAMUSCULAR | Status: DC | PRN

## 2017-01-15 MED ORDER — ROPIVACAINE HCL 7.5 MG/ML IJ SOLN
INTRAMUSCULAR | Status: DC | PRN
  Administered 2017-01-15: 20 mL via PERINEURAL

## 2017-01-15 MED ORDER — METOCLOPRAMIDE HCL 5 MG PO TABS: 5.0000 mg | ORAL_TABLET | Freq: Three times a day (TID) | ORAL | Status: DC | PRN

## 2017-01-15 MED ORDER — ONDANSETRON HCL 4 MG/2ML IJ SOLN: 4.0000 mg | Freq: Four times a day (QID) | INTRAMUSCULAR | Status: DC | PRN

## 2017-01-15 SURGICAL SUPPLY — 74 items
AUG FEM DISTAL 5X65 RL/LM (Orthopedic Implant) ×2 IMPLANT
AUG FEM DISTAL 5X65MM RL/LM (Orthopedic Implant) ×1 IMPLANT
AUG VNGD SSK 360 FEMUR L 67.5 (Joint) ×3 IMPLANT
AUGMENT FEM DISTAL 5X65 RL/LM (Orthopedic Implant) ×1 IMPLANT
AUGMENT VNGD SSK 360 FEML 67.5 (Joint) ×1 IMPLANT
BANDAGE ACE 4X5 VEL STRL LF (GAUZE/BANDAGES/DRESSINGS) IMPLANT
BANDAGE ACE 6X5 VEL STRL LF (GAUZE/BANDAGES/DRESSINGS) IMPLANT
BANDAGE ELASTIC 6 VELCRO ST LF (GAUZE/BANDAGES/DRESSINGS) ×3 IMPLANT
BANDAGE ESMARK 6X9 LF (GAUZE/BANDAGES/DRESSINGS) ×1 IMPLANT
BAR LOCKING TIBIAL (Orthopedic Implant) ×1 IMPLANT
BEARING TIB VANGUARD 71/75X12 (Knees) ×3 IMPLANT
BLADE CLIPPER SURG (BLADE) IMPLANT
BLADE SAG 18X100X1.27 (BLADE) IMPLANT
BLADE SAW SAG 90X13X1.27 (BLADE) ×3 IMPLANT
BNDG ESMARK 6X9 LF (GAUZE/BANDAGES/DRESSINGS) ×3
BOWL SMART MIX CTS (DISPOSABLE) IMPLANT
CEMENT BONE REFOBACIN R1X40 US (Cement) ×6 IMPLANT
COVER BACK TABLE 24X17X13 BIG (DRAPES) IMPLANT
COVER SURGICAL LIGHT HANDLE (MISCELLANEOUS) ×3 IMPLANT
CUFF TOURNIQUET SINGLE 34IN LL (TOURNIQUET CUFF) ×3 IMPLANT
CUFF TOURNIQUET SINGLE 44IN (TOURNIQUET CUFF) IMPLANT
DISC DIAMOND MED (BURR) IMPLANT
DRAPE HALF SHEET 40X57 (DRAPES) IMPLANT
DRAPE IMP U-DRAPE 54X76 (DRAPES) IMPLANT
DRAPE U-SHAPE 47X51 STRL (DRAPES) ×3 IMPLANT
DRESSING AQUACEL AQ EXTRA 4X5 (GAUZE/BANDAGES/DRESSINGS) ×3 IMPLANT
DURAPREP 26ML APPLICATOR (WOUND CARE) ×6 IMPLANT
ELECT REM PT RETURN 9FT ADLT (ELECTROSURGICAL) ×3
ELECTRODE REM PT RTRN 9FT ADLT (ELECTROSURGICAL) ×1 IMPLANT
EVACUATOR 1/8 PVC DRAIN (DRAIN) IMPLANT
GAUZE SPONGE 4X4 12PLY STRL (GAUZE/BANDAGES/DRESSINGS) IMPLANT
GAUZE XEROFORM 1X8 LF (GAUZE/BANDAGES/DRESSINGS) ×3 IMPLANT
GLOVE BIO SURGEON STRL SZ7.5 (GLOVE) ×3 IMPLANT
GLOVE BIO SURGEON STRL SZ8.5 (GLOVE) ×6 IMPLANT
GLOVE BIOGEL PI IND STRL 8 (GLOVE) ×2 IMPLANT
GLOVE BIOGEL PI IND STRL 9 (GLOVE) ×1 IMPLANT
GLOVE BIOGEL PI INDICATOR 8 (GLOVE) ×4
GLOVE BIOGEL PI INDICATOR 9 (GLOVE) ×2
GOWN STRL REUS W/ TWL LRG LVL3 (GOWN DISPOSABLE) ×1 IMPLANT
GOWN STRL REUS W/ TWL XL LVL3 (GOWN DISPOSABLE) ×3 IMPLANT
GOWN STRL REUS W/TWL LRG LVL3 (GOWN DISPOSABLE) ×2
GOWN STRL REUS W/TWL XL LVL3 (GOWN DISPOSABLE) ×6
HANDPIECE INTERPULSE COAX TIP (DISPOSABLE) ×2
HOOD PEEL AWAY FACE SHEILD DIS (HOOD) ×9 IMPLANT
KIT BASIN OR (CUSTOM PROCEDURE TRAY) ×3 IMPLANT
KIT ROOM TURNOVER OR (KITS) ×3 IMPLANT
MANIFOLD NEPTUNE II (INSTRUMENTS) ×3 IMPLANT
NEEDLE SPNL 18GX3.5 QUINCKE PK (NEEDLE) IMPLANT
NS IRRIG 1000ML POUR BTL (IV SOLUTION) ×3 IMPLANT
PACK TOTAL JOINT (CUSTOM PROCEDURE TRAY) ×3 IMPLANT
PACK UNIVERSAL I (CUSTOM PROCEDURE TRAY) IMPLANT
PAD ARMBOARD 7.5X6 YLW CONV (MISCELLANEOUS) IMPLANT
PAD CAST 4YDX4 CTTN HI CHSV (CAST SUPPLIES) IMPLANT
PADDING CAST COTTON 4X4 STRL (CAST SUPPLIES)
PADDING CAST COTTON 6X4 STRL (CAST SUPPLIES) IMPLANT
RASP HELIOCORDIAL MED (MISCELLANEOUS) IMPLANT
SET HNDPC FAN SPRY TIP SCT (DISPOSABLE) ×1 IMPLANT
STAPLER VISISTAT 35W (STAPLE) IMPLANT
STEM SPLINED KNEE 18MMX120MM (Joint) ×3 IMPLANT
SUCTION FRAZIER HANDLE 10FR (MISCELLANEOUS)
SUCTION TUBE FRAZIER 10FR DISP (MISCELLANEOUS) IMPLANT
SUT VIC AB 0 CT1 27 (SUTURE) ×2
SUT VIC AB 0 CT1 27XBRD ANBCTR (SUTURE) ×1 IMPLANT
SUT VIC AB 1 CTX 36 (SUTURE) ×4
SUT VIC AB 1 CTX36XBRD ANBCTR (SUTURE) ×2 IMPLANT
SUT VIC AB 2-0 CT1 27 (SUTURE) ×4
SUT VIC AB 2-0 CT1 TAPERPNT 27 (SUTURE) ×2 IMPLANT
SWAB CULTURE ESWAB REG 1ML (MISCELLANEOUS) IMPLANT
SYR 50ML LL SCALE MARK (SYRINGE) IMPLANT
TIBIAL LOCKING BAR (Orthopedic Implant) ×3 IMPLANT
TOWEL OR 17X24 6PK STRL BLUE (TOWEL DISPOSABLE) IMPLANT
TOWEL OR 17X26 10 PK STRL BLUE (TOWEL DISPOSABLE) ×3 IMPLANT
TRAY FOLEY CATH SILVER 14FR (SET/KITS/TRAYS/PACK) IMPLANT
WATER STERILE IRR 1000ML POUR (IV SOLUTION) IMPLANT

## 2017-01-15 NOTE — Progress Notes (Signed)
Orthopedic Tech Progress Note Patient Details:  Steven Horton 1930/10/22 [id number]  Ortho Devices Type of Ortho Device:  (footsie roll) Ortho Device/Splint Location: Lannette Donath, Izaak Sahr 01/15/2017, 1:08 PM

## 2017-01-15 NOTE — Anesthesia Preprocedure Evaluation (Addendum)
Anesthesia Evaluation  Patient identified by MRN, date of birth, ID band Patient awake    Reviewed: Allergy & Precautions, NPO status , Patient's Chart, lab work & pertinent test results  Airway Mallampati: II  TM Distance: <3 FB Neck ROM: Limited    Dental no notable dental hx.    Pulmonary former smoker,    breath sounds clear to auscultation       Cardiovascular hypertension, + Peripheral Vascular Disease   Rhythm:Regular Rate:Normal     Neuro/Psych    GI/Hepatic PUD,   Endo/Other    Renal/GU      Musculoskeletal  (+) Arthritis ,   Abdominal   Peds  Hematology   Anesthesia Other Findings   Reproductive/Obstetrics                            Anesthesia Physical Anesthesia Plan  ASA: II  Anesthesia Plan: Spinal   Post-op Pain Management:  [facility] for Post-op pain   Induction:   PONV Risk Score and Plan: 1 and Ondansetron and Dexamethasone  Airway Management Planned: Natural Airway and Nasal Cannula  Additional Equipment:   Intra-op Plan:   Post-operative Plan:   Informed Consent: I have reviewed the patients History and Physical, chart, labs and discussed the procedure including the risks, benefits and alternatives for the proposed anesthesia with the patient or authorized representative who has indicated his/her understanding and acceptance.   Dental advisory given  Plan Discussed with: CRNA  Anesthesia Plan Comments:         Anesthesia Quick Evaluation

## 2017-01-15 NOTE — Interval H&P Note (Signed)
History and Physical Interval Note:  01/15/2017 8:56 AM  Steven Horton  has presented today for surgery, with the diagnosis of LOOSE LEFT TOTAL KNEE ARTHROPLASTY  The various methods of treatment have been discussed with the patient and family. After consideration of risks, benefits and other options for treatment, the patient has consented to  Procedure(s): TOTAL KNEE REVISION (Left) as a surgical intervention .  The patient's history has been reviewed, patient examined, no change in status, stable for surgery.  I have reviewed the patient's chart and labs.  Questions were answered to the patient's satisfaction.     Kerin Salen

## 2017-01-15 NOTE — Anesthesia Procedure Notes (Signed)
Procedure Name: MAC Date/Time: 01/15/2017 10:00 AM Performed by: Garrison Columbus T Pre-anesthesia Checklist: Patient identified, Emergency Drugs available, Suction available and Patient being monitored Patient Re-evaluated:Patient Re-evaluated prior to induction Oxygen Delivery Method: Simple face mask Preoxygenation: Pre-oxygenation with 100% oxygen Induction Type: IV induction Placement Confirmation: positive ETCO2 and breath sounds checked- equal and bilateral Dental Injury: Teeth and Oropharynx as per pre-operative assessment

## 2017-01-15 NOTE — Discharge Instructions (Signed)

## 2017-01-15 NOTE — Evaluation (Signed)
Physical Therapy Evaluation Patient Details Name: Steven Horton MRN: [id number] DOB: 1931-04-18 Today's Date: 01/15/2017   History of Present Illness  Pt is an 83 y.o. male now s/p elective L TKA on 01/15/17. Pertinent PMH includes HTN, DVT, HOH (wears hearing aids), arthritis.    Clinical Impression  Pt presents with an overall decrease in functional mobility secondary to above. PTA, pt mod indep with SPC and lives at home alone. Educ on precautions, positioning, therex, and importance of mobility. Today, pt able to perform therex seated EOB and amb to bedside chair with RW and min guard for balance; further mobility limited secondary to fatigue. Pt would benefit from continued acute PT services to maximize functional mobility and independence prior to d/c with follow-up SNF-level therapies.     Follow Up Recommendations DC plan and follow up therapy as arranged by surgeon;SNF    Equipment Recommendations  Other (comment) (defer to next venue)    Recommendations for Other Services OT consult     Precautions / Restrictions Precautions Precautions: Knee Precaution Booklet Issued: No Precaution Comments: Verbally reviewed precautions + resting knee in ext (use of bone foam) Restrictions Weight Bearing Restrictions: Yes LLE Weight Bearing: Weight bearing as tolerated      Mobility  Bed Mobility Overal bed mobility: Needs Assistance Bed Mobility: Supine to Sit     Supine to sit: Min guard;HOB elevated     General bed mobility comments: Increased time and effort required; cues for technique, but no physical assist required  Transfers Overall transfer level: Needs assistance Equipment used: Rolling walker (2 wheeled) Transfers: Sit to/from Stand Sit to Stand: Min assist         General transfer comment: Able to stand on second attempt with RW and minA to assist trunk elevation into standing; cues for hand placement and technique with  RW.  Ambulation/Gait Ambulation/Gait assistance: Min guard Ambulation Distance (Feet): 5 Feet Assistive device: Rolling walker (2 wheeled) Gait Pattern/deviations: Step-to pattern;Antalgic;Trunk flexed;Decreased weight shift to left Gait velocity: Decreased Gait velocity interpretation: <1.8 ft/sec, indicative of risk for recurrent falls General Gait Details: Amb to bedside chair with RW and min guard for balance; cues for step-through pattern and sequencing with RW. Slow, antalgic gait.   Stairs            Wheelchair Mobility    Modified Rankin (Stroke Patients Only)       Balance Overall balance assessment: Needs assistance Sitting-balance support: No upper extremity supported;Feet supported;Feet unsupported Sitting balance-Leahy Scale: Fair     Standing balance support: Bilateral upper extremity supported Standing balance-Leahy Scale: Poor Standing balance comment: Reliant on BUE support                             Pertinent Vitals/Pain Pain Assessment: Faces Faces Pain Scale: Hurts a little bit Pain Location: R knee  Pain Descriptors / Indicators: Sore Pain Intervention(s): Limited activity within patient's tolerance;Monitored during session    Home Living Family/patient expects to be discharged to:: Skilled nursing facility Living Arrangements: Alone                    Prior Function Level of Independence: Independent with assistive device(s)         Comments: Mod indep with SPC secondary to L knee pain     Hand Dominance        Extremity/Trunk Assessment   Upper Extremity Assessment Upper Extremity Assessment: Generalized weakness  Lower Extremity Assessment Lower Extremity Assessment: RLE deficits/detail RLE Deficits / Details: s/p R TKA; RLE grossly 3/5    Cervical / Trunk Assessment Cervical / Trunk Assessment: Kyphotic  Communication   Communication: HOH  Cognition Arousal/Alertness: Awake/alert Behavior  During Therapy: WFL for tasks assessed/performed Overall Cognitive Status: Within Functional Limits for tasks assessed                                        General Comments General comments (skin integrity, edema, etc.): SpO2 >90% on RA    Exercises Total Joint Exercises Ankle Circles/Pumps: AROM;Both;Seated Long Arc Quad: AROM;Left;20 reps;Seated Knee Flexion: AROM;Left;15 reps;Seated Goniometric ROM: L knee flexion grossly 0-80   Assessment/Plan    PT Assessment Patient needs continued PT services  PT Problem List Decreased strength;Decreased range of motion;Decreased activity tolerance;Decreased balance;Decreased mobility;Decreased knowledge of use of DME;Decreased knowledge of precautions;Pain       PT Treatment Interventions DME instruction;Gait training;Stair training;Functional mobility training;Therapeutic exercise;Therapeutic activities;Balance training;Patient/family education    PT Goals (Current goals can be found in the Care Plan section)  Acute Rehab PT Goals Patient Stated Goal: Get rehab before going home PT Goal Formulation: With patient Time For Goal Achievement: 01/29/17 Potential to Achieve Goals: Good    Frequency 7X/week   Barriers to discharge Decreased caregiver support      Co-evaluation               AM-PAC PT "6 Clicks" Daily Activity  Outcome Measure Difficulty turning over in bed (including adjusting bedclothes, sheets and blankets)?: A Little Difficulty moving from lying on back to sitting on the side of the bed? : A Little Difficulty sitting down on and standing up from a chair with arms (e.g., wheelchair, bedside commode, etc,.)?: A Little Help needed moving to and from a bed to chair (including a wheelchair)?: A Little Help needed walking in hospital room?: A Little Help needed climbing 3-5 steps with a railing? : A Lot 6 Click Score: 17    End of Session Equipment Utilized During Treatment: Gait belt Activity  Tolerance: Patient tolerated treatment well;Patient limited by fatigue Patient left: in chair;with call bell/phone within reach Nurse Communication: Mobility status PT Visit Diagnosis: Other abnormalities of gait and mobility (R26.89);Pain Pain - Right/Left: Left Pain - part of body: Knee    Time: 1456-1520 PT Time Calculation (min) (ACUTE ONLY): 24 min   Charges:   PT Evaluation $PT Eval Moderate Complexity: 1 Mod PT Treatments $Therapeutic Exercise: 8-22 mins   PT G Codes:       Mabeline Caras, PT, DPT Acute Rehab Services  Pager: [place] 01/15/2017, 3:28 PM

## 2017-01-15 NOTE — Op Note (Signed)
PATIENT ID:      Steven Horton  MRN:     [id number] DOB/AGE:    Feb 08, 1931 / 83 y.o.       OPERATIVE REPORT    DATE OF PROCEDURE:  01/15/2017       PREOPERATIVE DIAGNOSIS:   LOOSE LEFT TOTAL KNEE ARTHROPLASTY      Estimated body mass index is 28.69 kg/m as calculated from the following:   Height as of 03/26/14: 5\' 7"  (1.702 m).   Weight as of 01/13/17: 83.1 kg (183 lb 3.2 oz).                                                        POSTOPERATIVE DIAGNOSIS:   LOOSE LEFT TOTAL KNEE ARTHROPLASTY                                                                      PROCEDURE:  Revision left total knee  Arthroplasty with removalof a grossly loose, 65 mm Biomet Maxim femoral component  Revision to a new 67.5 mm stemmed advantage compone with a 18 mm x 120 mm neutral stem using distal cement techni    SURGEON: Cheyla Duchemin J    ASSISTANT:   [employer]   (Present and scrubbed throughout the case, critical for assistance with exposure, retraction, instrumentation, and closure.)         ANESTHESIA: Spinal, 20cc Exparel, 50cc 0.25% Marcaine  EBL: 400 cc  FLUID REPLACEMENT: 1600 cc of crystalloid crystalloid  TOURNIQUET TIME: 76min  Drains: None  Tranexamic Acid: 1gm IV, 2gm topical  COMPLICATIONS:  None         INDICATIONS FOR PROCEDURE: The patient has  LOOSE LEFT TOTAL KNEE ARTHROPLASTY,   DESCRIPTION OF PROCEDURE: The patient identified by armband, received  IV antibiotics, in the holding area at [facility]. Patient taken to the operating room, appropriate anesthetic  monitors were attached, and Spinal anesthesia was  induced. Tourniquet  applied high to the operative thigh. Lateral post and foot positioner  applied to the table, the lower extremity was then prepped and draped  in usual sterile fashion from the toes to the tourniquet. Time-out procedure was performed. We began the operation, with the knee flexed 120 degrees, by making the anterior midline incision starting  at handbreadth above the patella going over the patella 1 cm medial to and 4 cm distal to the tibial tubercle. Small bleeders in the skin and the  subcutaneous tissue identified and cauterized. Transverse retinaculum was incised and reflected medially and a medial parapatellar arthrotomy was accomplished. the patella was everted and theprepatellar fat pad resected. The superficial medial collateral ligament was then elevated from anterior to posterior along the proximal  flare of the tibia Exposing the tibial Component, which was found be stable. Synovial tissue was sent for Gram stain and culture.  Removing scar tissue from around insertion of the Patellar tendon.  We are able to evert the patella and hyperfletx the knee to 130.  This allowed removal of Biomet Maxim 10 mm bearing and clip.  Further  allowing [place] to scar tissue from Around the grossly loose femoral  This component was Removed using small osteotomes. We then removed fibrous scar tissue from the bed of the femur.  The medial and lateral walls.  The condyles were intact, but there was much bone loss medially and moderate bone loss laterally.  After removing all of the loose fibrous tissue.  There was a good bony bed that was support cement in the compression mode and it was felt that a cemented bandage prosthesis would be able to take care of the problem.  This gentleman had.  We then sequentially reamed the femur up to 18 mm to the appropriate depth for a 120 mm stem.  Using the reamer as a guide.  We then performed a very superficial cut to the lateral aspect of the femur, the medial aspect had a 5-10 mm deficiency and no cut was required.  No chamfer cuts were required and no box cut was required.  We then assembled a 67.5 mm trial with a 120 mm x 18 mm stem and inserted it.  It actually had a very good fit laterally.  There is a defect medially and a 5 mm distal augment was placed.  Trial reduction was performed with a 10 mm bearing.  The knee had  a much better appearance.  The collateral ligaments were stable, although somewhat loose with a 10, so we tried a 12.5 mm bearing, which also had full extension and better stability.  At this point, the trial components were removed.  A real femoral component consisting of a 67.5 mm vantage femoral component with a 120 mm x 18 mm stem was assembled at the back table.  A double patch of cement with gentamicin in it was mixed and applied to all bony and metallic mating surfaces and the femoral component was then hammered to the appropriate depth on the femur.  The cement was allowed to harden and trial reductions were performed with a 10 mm and 12.5 mm bearing.  The 12.5 mm bearing have the best fit and range of motion and a real 12.5 mm Biomet bearing was then snapped into place on the 71 mm retained tibial implant followed by the locking clip.  The knee was reduced.  Stability was found to be excellent.  Range of motion was excellent.  The patellar component was found to be intact and was not revised.  At this point, the tourniquet which had been up during the placement of cement was let down.  Small bleeders were once again identified and cauterized.  The subcutaneous tissues were infiltrated with Exparel as were the periarticular tissues.  The wound was once again irrigated out with pulse lavage.  We then closed in layers with running #1 Vicryl suture in the medial parapatellar arthrotomy, 0 and 2-0 Vicryl in the subcutaneous tissue and 3-0 Vicryl in the subcuticular tissues.  A dressing of Aquacel and an Ace wrap was then applied.  The patient was awakened, extubated and taken to the recovery room without difficulty.  Kerin Salen 01/15/2017, 2:00 PM

## 2017-01-15 NOTE — Anesthesia Procedure Notes (Signed)
Spinal  Patient location during procedure: OR Start time: 01/15/2017 10:00 AM End time: 01/15/2017 10:08 AM Staffing Anesthesiologist: Rica Koyanagi Performed: anesthesiologist  Preanesthetic Checklist Completed: patient identified, site marked, surgical consent, pre-op evaluation, timeout performed, IV checked, risks and benefits discussed and monitors and equipment checked Spinal Block Patient position: sitting Prep: ChloraPrep Patient monitoring: heart rate, cardiac monitor, continuous pulse ox and blood pressure Approach: right paramedian Location: L3-4 Injection technique: single-shot Needle Needle type: Quincke  Needle gauge: 22 G Needle length: 9 cm Needle insertion depth: 5 cm Assessment Sensory level: T6

## 2017-01-15 NOTE — Transfer of Care (Signed)
Immediate Anesthesia Transfer of Care Note  Patient: Steven Horton  Procedure(s) Performed: Procedure(s): TOTAL KNEE REVISION (Left)  Patient Location: PACU  Anesthesia Type:MAC and Spinal  Level of Consciousness: awake, alert  and oriented  Airway & Oxygen Therapy: Patient Spontanous Breathing  Post-op Assessment: Report given to RN, Post -op Vital signs reviewed and stable and Patient moving all extremities X 4  Post vital signs: Reviewed and stable  Last Vitals:  Vitals:   01/15/17 0757 01/15/17 0928  BP: (!) 144/59 (!) 160/54  Pulse: 63 60  Resp: 16 20  Temp: 37 C   SpO2: 99% 96%    Last Pain:  Vitals:   01/15/17 0757  TempSrc: Oral         Complications: No apparent anesthesia complications

## 2017-01-15 NOTE — Anesthesia Procedure Notes (Addendum)
Anesthesia Regional Block: Adductor canal block   Pre-Anesthetic Checklist: ,, timeout performed,, Correct Site, Correct Laterality, Correct Procedure, Correct Position, site marked, risks and benefits discussed, Surgical consent,  Pre-op evaluation,  At surgeon's request and post-op pain management  Laterality: Left and Lower  Prep: chloraprep       Needles:   Needle Type: Echogenic Stimulator Needle     Needle Length: 9cm  Needle Gauge: 21   Needle insertion depth: 5 cm   Additional Needles:   Procedures:,,,, ultrasound used (permanent image in chart),,,,  Narrative:  Start time: 01/15/2017 9:12 AM End time: 01/15/2017 9:28 AM Injection made incrementally with aspirations every 5 mL.  Performed by: Personally  Anesthesiologist: Abuk Selleck

## 2017-01-16 LAB — BASIC METABOLIC PANEL
Anion gap: 5 (ref 5–15)
BUN: 23 mg/dL — AB (ref 6–20)
CHLORIDE: 113 mmol/L — AB (ref 101–111)
CO2: 22 mmol/L (ref 22–32)
CREATININE: 1.58 mg/dL — AB (ref 0.61–1.24)
Calcium: 8.6 mg/dL — ABNORMAL LOW (ref 8.9–10.3)
GFR calc Af Amer: 45 mL/min — ABNORMAL LOW (ref >60)
GFR calc non Af Amer: 38 mL/min — ABNORMAL LOW (ref >60)
GLUCOSE: 118 mg/dL — AB (ref 65–99)
POTASSIUM: 4.3 mmol/L (ref 3.5–5.1)
SODIUM: 140 mmol/L (ref 135–145)

## 2017-01-16 LAB — CBC
HCT: 34.7 % — ABNORMAL LOW (ref 39.0–52.0)
HEMOGLOBIN: 11.9 g/dL — AB (ref 13.0–17.0)
MCH: 33.6 pg (ref 26.0–34.0)
MCHC: 34.3 g/dL (ref 30.0–36.0)
MCV: 98 fL (ref 78.0–100.0)
Platelets: 182 10*3/uL (ref 150–400)
RBC: 3.54 MIL/uL — ABNORMAL LOW (ref 4.22–5.81)
RDW: 14.6 % (ref 11.5–15.5)
WBC: 10.4 10*3/uL (ref 4.0–10.5)

## 2017-01-16 NOTE — Evaluation (Signed)
Occupational Therapy Evaluation Patient Details Name: Steven Horton MRN: [id number] DOB: 10/26/1930 Today's Date: 01/16/2017    History of Present Illness Pt is an 83 y.o. male now s/p L TKA revision after the first one on 01/15/17 failed. Pertinent PMH includes HTN, DVT, HOH (wears hearing aids), arthritis.   Clinical Impression   PTA pt modified independent in ADL and mobility with SPC. Pt is currently mod A for LB ADL and min guard assist for stand pivot to recliner. Pt demonstrating increased need for assist in ADL and functional transfers requiring skilled OT in the acute setting and afterwards at SNF level as Pt lives alone. Please see generalized comments below about bleeding this session. RN aware and monitoring. Next session to focus on AE for LB bathing/dressing.    Follow Up Recommendations  SNF;Supervision/Assistance - 24 hour    Equipment Recommendations  Other (comment) (defer to next venue)    Recommendations for Other Services       Precautions / Restrictions Precautions Precautions: Knee Restrictions Weight Bearing Restrictions: Yes LLE Weight Bearing: Weight bearing as tolerated      Mobility Bed Mobility Overal bed mobility: Needs Assistance Bed Mobility: Supine to Sit     Supine to sit: Min guard;HOB elevated     General bed mobility comments: Increased time and effort required; cues for technique, but no physical assist required  Transfers Overall transfer level: Needs assistance Equipment used: Rolling walker (2 wheeled) Transfers: Sit to/from [facility] Sit to Stand: Min assist Stand pivot transfers: Min guard       General transfer comment: Able to stand on second attempt with RW and minA to assist trunk elevation into standing; cues for hand placement and technique with RW.; min guard for pivot to recliner`    Balance Overall balance assessment: Needs assistance Sitting-balance support: No upper extremity supported;Feet  supported;Feet unsupported Sitting balance-Leahy Scale: Fair Sitting balance - Comments: able to sit EOB with no back support   Standing balance support: Bilateral upper extremity supported Standing balance-Leahy Scale: Poor Standing balance comment: Reliant on BUE support                           ADL either performed or assessed with clinical judgement   ADL Overall ADL's : Needs assistance/impaired Eating/Feeding: Modified independent;Sitting   Grooming: Set up;Sitting   Upper Body Bathing: Moderate assistance;Sitting Upper Body Bathing Details (indicate cue type and reason): mod A for back Lower Body Bathing: Moderate assistance;Sitting/lateral leans Lower Body Bathing Details (indicate cue type and reason): assist for knees down Upper Body Dressing : Set up   Lower Body Dressing: Maximal assistance   Toilet Transfer: Minimal assistance;Stand-pivot;BSC;RW Toilet Transfer Details (indicate cue type and reason): min A for power up and balance assist Toileting- Clothing Manipulation and Hygiene: Minimal assistance;Sit to/from stand       Functional mobility during ADLs:  (stand pivot only this session due to bleeding)       Vision Baseline Vision/History: Wears glasses Wears Glasses: At all times Patient Visual Report: No change from baseline       Perception     Praxis      Pertinent Vitals/Pain Pain Assessment: 0-10 Pain Score: 4  Pain Location: L  knee Pain Descriptors / Indicators: Sore Pain Intervention(s): Monitored during session;Repositioned;Ice applied     Hand Dominance     Extremity/Trunk Assessment Upper Extremity Assessment Upper Extremity Assessment: Overall WFL for tasks assessed   Lower  Extremity Assessment Lower Extremity Assessment: LLE deficits/detail;Defer to PT evaluation LLE Deficits / Details: s/p L TKA revision   Cervical / Trunk Assessment Cervical / Trunk Assessment: Kyphotic   Communication  Communication Communication: HOH   Cognition Arousal/Alertness: Awake/alert Behavior During Therapy: WFL for tasks assessed/performed Overall Cognitive Status: Within Functional Limits for tasks assessed                                     General Comments  When OT entered the room, Pt had bled through his ace bandage in 2 different spots. RN called and changed dressing stand pivot performed so that clean linens could be applied to bed. RN monitoring bleeding after transfer, and will continue to monitor    Exercises     Shoulder Instructions      Home Living Family/patient expects to be discharged to:: Skilled nursing facility Living Arrangements: Alone                                      Prior Functioning/Environment Level of Independence: Independent with assistive device(s)        Comments: Mod indep with SPC secondary to L knee pain        OT Problem List: Decreased activity tolerance;Impaired balance (sitting and/or standing);Decreased knowledge of use of DME or AE;Decreased safety awareness;Decreased knowledge of precautions;Pain      OT Treatment/Interventions: Self-care/ADL training;DME and/or AE instruction;Therapeutic activities;Patient/family education;Balance training    OT Goals(Current goals can be found in the care plan section) Acute Rehab OT Goals Patient Stated Goal: Get rehab before going home OT Goal Formulation: With patient Time For Goal Achievement: 01/24/17 Potential to Achieve Goals: Good ADL Goals Pt Will Perform Grooming: with supervision;standing Pt Will Perform Lower Body Bathing: with supervision;with adaptive equipment;sit to/from stand Pt Will Perform Lower Body Dressing: with supervision;with adaptive equipment;sit to/from stand Pt Will Transfer to Toilet: with modified independence;ambulating;bedside commode (with RW; BSC over toilet) Pt Will Perform Toileting - Clothing Manipulation and hygiene: with  modified independence;sit to/from stand  OT Frequency: Min 2X/week   Barriers to D/C: Decreased caregiver support  Pt lives alone       Co-evaluation              AM-PAC PT "6 Clicks" Daily Activity     Outcome Measure Help from another person eating meals?: None Help from another person taking care of personal grooming?: A Little Help from another person toileting, which includes using toliet, bedpan, or urinal?: A Little Help from another person bathing (including washing, rinsing, drying)?: A Little Help from another person to put on and taking off regular upper body clothing?: None Help from another person to put on and taking off regular lower body clothing?: A Little 6 Click Score: 20   End of Session Equipment Utilized During Treatment: Gait belt;Rolling walker Nurse Communication: Mobility status;Other (comment) (aware of bleeding - monitoring)  Activity Tolerance: Patient tolerated treatment well Patient left: in chair;with call bell/phone within reach;with chair alarm set  OT Visit Diagnosis: Unsteadiness on feet (R26.81);Other abnormalities of gait and mobility (R26.89);Pain Pain - Right/Left: Left Pain - part of body: Knee                Time: 1035-1101 OT Time Calculation (min): 26 min Charges:  OT General Charges $OT Visit: 1 Visit  OT Evaluation $OT Eval Moderate Complexity: 1 Mod OT Treatments $Self Care/Home Management : 8-22 mins G-Codes:     Hulda Humphrey OTR/L [place] 01/16/2017, 11:46 AM

## 2017-01-16 NOTE — Progress Notes (Signed)
Subjective: 1 Day Post-Op Procedure(s) (LRB): TOTAL KNEE REVISION (Left) Patient reports pain as moderate. Overall doing okay. Had Foley removed this morning. Taking by mouth okay.    Objective: Vital signs in last 24 hours: Temp:  [97.5 F (36.4 C)-98.3 F (36.8 C)] 98.2 F (36.8 C) (09/22 0624) Pulse Rate:  [60-81] 71 (09/22 0624) Resp:  [14-22] 17 (09/21 1411) BP: (101-160)/(52-87) 131/60 (09/22 0624) SpO2:  [94 %-97 %] 97 % (09/22 0624)  Intake/Output from previous day: 09/21 0701 - 09/22 0700 In: 1585 [P.O.:360; I.V.:1050; IV Piggyback:100] Out: 3200 [Urine:2950; Blood:250] Intake/Output this shift: Total I/O In: -  Out: 200 [Urine:200]   Recent Labs  01/13/17 1322 01/16/17 0538  HGB 14.3 11.9*    Recent Labs  01/13/17 1322 01/16/17 0538  WBC 5.2 10.4  RBC 4.40 3.54*  HCT 42.8 34.7*  PLT 243 182    Recent Labs  01/13/17 1322 01/16/17 0538  NA 140 140  K 4.1 4.3  CL 110 113*  CO2 18* 22  BUN 24* 23*  CREATININE 1.88* 1.58*  GLUCOSE 124* 118*  CALCIUM 9.3 8.6*    Recent Labs  01/13/17 1322  INR 1.13  Left knee exam:  Neurovascular intact Sensation intact distally Intact pulses distally Dorsiflexion/Plantar flexion intact Incision: dressing C/D/I Compartment soft Moves toes actively  Assessment/Plan: 1 Day Post-Op Procedure(s) (LRB): TOTAL KNEE REVISION (Left) Chronic kidney disease. Plan: Continue IV fluids to keep him well-hydrated. Weight-bear as tolerated on left. Wear knee immobilizer when up. Up with therapy Discharge to SNF tomorrow afternoon or Monday morning. Aspirin 325 mg daily along with SCDs for DVT prophylaxis.  Hilary Milks G 01/16/2017, 8:46 AM

## 2017-01-16 NOTE — Progress Notes (Signed)
Physical Therapy Treatment Patient Details Name: Steven Horton MRN: [id number] DOB: 10/16/1930 Today's Date: 01/16/2017    History of Present Illness Pt is an 83 y.o. male now s/p L TKA revision after the first one on 01/15/17 failed. Pertinent PMH includes HTN, DVT, HOH (wears hearing aids), arthritis.    PT Comments    Pt is making good progress with mobility. Pt ambulated 125' with RW supervision, pt completed Knee exercises with min assist and has 90* flexion. Pt would continue to benefit from acute skilled PT until D/C to next venue of care as outlined by the physician.   Follow Up Recommendations  DC plan and follow up therapy as arranged by surgeon;SNF     Equipment Recommendations       Recommendations for Other Services       Precautions / Restrictions Precautions Precautions: Knee Restrictions Weight Bearing Restrictions: Yes LLE Weight Bearing: Weight bearing as tolerated    Mobility  Bed Mobility Overal bed mobility: Needs Assistance Bed Mobility: Supine to Sit     Supine to sit: Min guard;HOB elevated     General bed mobility comments: Increased time and effort required; cues for technique, but no physical assist required  Transfers Overall transfer level: Needs assistance Equipment used: Rolling walker (2 wheeled) Transfers: Sit to/from Stand Sit to Stand: Min guard Stand pivot transfers: Min guard       General transfer comment: cues for hand placement  Ambulation/Gait Ambulation/Gait assistance: Supervision Ambulation Distance (Feet): 145 Feet Assistive device: Rolling walker (2 wheeled) Gait Pattern/deviations: Step-through pattern;Decreased stride length Gait velocity: Decreased       Stairs            Wheelchair Mobility    Modified Rankin (Stroke Patients Only)       Balance Overall balance assessment: Needs assistance Sitting-balance support: No upper extremity supported Sitting balance-Leahy Scale: Good Sitting  balance - Comments: able to sit EOB with no back support   Standing balance support: Bilateral upper extremity supported Standing balance-Leahy Scale: Fair Standing balance comment: Reliant on BUE support                            Cognition Arousal/Alertness: Awake/alert Behavior During Therapy: WFL for tasks assessed/performed Overall Cognitive Status: Within Functional Limits for tasks assessed                                        Exercises Total Joint Exercises Ankle Circles/Pumps: AROM;Strengthening;Both;10 reps;Supine Quad Sets: AROM;Strengthening;Left;10 reps;Supine Heel Slides: AAROM;Strengthening;Left;10 reps;Seated Hip ABduction/ADduction: AAROM;Strengthening;Left;10 reps;Seated Straight Leg Raises: AAROM;Strengthening;Left;10 reps;Seated Long Arc Quad: AROM;Strengthening;Left;10 reps;Seated Knee Flexion: AAROM;Strengthening;Left;10 reps;Seated Goniometric ROM: 90*    General Comments General comments (skin integrity, edema, etc.): When OT entered the room, Pt had bled through his ace bandage in 2 different spots. RN called and changed dressing stand pivot performed so that clean linens could be applied to bed. RN monitoring bleeding after transfer, and will continue to monitor      Pertinent Vitals/Pain Pain Assessment: 0-10 Pain Score: 3  Pain Location: L  knee Pain Descriptors / Indicators: Discomfort Pain Intervention(s): Limited activity within patient's tolerance;Monitored during session;Repositioned    Home Living Family/patient expects to be discharged to:: Skilled nursing facility Living Arrangements: Alone                  Prior Function  Level of Independence: Independent with assistive device(s)      Comments: Mod indep with SPC secondary to L knee pain   PT Goals (current goals can now be found in the care plan section) Acute Rehab PT Goals Patient Stated Goal: Get rehab before going home Progress towards PT  goals: Progressing toward goals    Frequency    7X/week      PT Plan Current plan remains appropriate    Co-evaluation              AM-PAC PT "6 Clicks" Daily Activity  Outcome Measure  Difficulty turning over in bed (including adjusting bedclothes, sheets and blankets)?: A Little Difficulty moving from lying on back to sitting on the side of the bed? : A Little Difficulty sitting down on and standing up from a chair with arms (e.g., wheelchair, bedside commode, etc,.)?: A Little Help needed moving to and from a bed to chair (including a wheelchair)?: A Little Help needed walking in hospital room?: A Little Help needed climbing 3-5 steps with a railing? : A Lot 6 Click Score: 17    End of Session   Activity Tolerance: Patient tolerated treatment well Patient left: in chair;with call bell/phone within reach Nurse Communication: Mobility status PT Visit Diagnosis: Other abnormalities of gait and mobility (R26.89);Pain Pain - Right/Left: Left Pain - part of body: Knee     Time: 3846-6599 PT Time Calculation (min) (ACUTE ONLY): 25 min  Charges:  $Gait Training: 8-22 mins $Therapeutic Exercise: 8-22 mins                    G Codes:       Theodoro Grist, PT   Lelon Mast 01/16/2017, 11:52 AM

## 2017-01-17 LAB — CBC
HEMATOCRIT: 33.1 % — AB (ref 39.0–52.0)
Hemoglobin: 10.7 g/dL — ABNORMAL LOW (ref 13.0–17.0)
MCH: 31.5 pg (ref 26.0–34.0)
MCHC: 32.3 g/dL (ref 30.0–36.0)
MCV: 97.4 fL (ref 78.0–100.0)
PLATELETS: 185 10*3/uL (ref 150–400)
RBC: 3.4 MIL/uL — AB (ref 4.22–5.81)
RDW: 14.5 % (ref 11.5–15.5)
WBC: 9.1 10*3/uL (ref 4.0–10.5)

## 2017-01-17 MED ORDER — INFLUENZA VAC SPLIT HIGH-DOSE 0.5 ML IM SUSY
0.5000 mL | PREFILLED_SYRINGE | INTRAMUSCULAR | Status: AC
  Administered 2017-01-18: 0.5 mL via INTRAMUSCULAR
  Filled 2017-01-17: qty 0.5

## 2017-01-17 NOTE — Discharge Summary (Deleted)
Patient ID: Steven Horton MRN: [id number] DOB/AGE: [birth year] 83 y.o.  Admit date: 01/15/2017 Discharge date: 01/17/2017  Admission Diagnoses:  Principal Problem:   Failed total knee, left [facility]) Active Problems:   Primary osteoarthritis of left knee   Discharge Diagnoses:  Same  Past Medical History:  Diagnosis Date  . Arthritis   . DVT (deep venous thrombosis) ([place])   . HOH (hard of hearing)   . Hypertension   . Stomach ulcer     Surgeries: Procedure(s):Left TOTAL KNEE REVISION on 01/15/2017   Consultants:   Discharged Condition: Improved  Hospital Course: Steven Horton is an 83 y.o. male who was admitted 01/15/2017 for operative treatment ofFailed total knee, left ([place]). Patient has severe unremitting pain that affects sleep, daily activities, and work/hobbies. After pre-op clearance the patient was taken to the operating room on 01/15/2017 and underwent  Procedure(s): Left TOTAL KNEE REVISION.    Patient was given perioperative antibiotics: Anti-infectives    Start     Dose/Rate Route Frequency Ordered Stop   01/15/17 0930  ceFAZolin (ANCEF) IVPB 2g/100 mL premix     2 g 200 mL/hr over 30 Minutes Intravenous To [facility]Stay [facility] 01/14/17 1100 01/15/17 1015       Patient was given sequential compression devices, early ambulation, and chemoprophylaxis to prevent DVT.On postoperative day #1 the patient ambulated 150 feet in the hall with physical therapy.  Patient benefited maximally from hospital stay and there were no complications.    Recent vital signs: Patient Vitals for the past 24 hrs:  BP Temp Temp src Pulse Resp SpO2  01/17/17 0758 (!) 149/83 - - 77 - 99 %  01/17/17 0548 (!) 156/65 97.9 F (36.6 C) Oral 65 - 99 %  01/16/17 2019 139/61 98.6 F (37 C) Oral 68 - 98 %  01/16/17 1422 (!) 128/50 98.4 F (36.9 C) Oral 81 20 96 %     Recent laboratory studies:  Recent Labs  01/16/17 0538 01/17/17 0300  WBC 10.4 9.1  HGB 11.9* 10.7*  HCT 34.7* 33.1*   PLT 182 185  NA 140  --   K 4.3  --   CL 113*  --   CO2 22  --   BUN 23*  --   CREATININE 1.58*  --   GLUCOSE 118*  --   CALCIUM 8.6*  --      Discharge Medications:   Allergies as of 01/17/2017   No Known Allergies     Medication List    STOP taking these medications   doxycycline 100 MG tablet Commonly known as:  VIBRA-TABS     TAKE these medications   aspirin EC 325 MG tablet Take 1 tablet (325 mg total) by mouth 2 (two) times daily. What changed:  medication strength  how much to take  when to take this   DERMEND BRUISE FORMULA EX Apply 1 application topically as needed (bruising).   docusate sodium 100 MG capsule Commonly known as:  COLACE Take 100 mg by mouth every evening.   fenofibrate 160 MG tablet Take 160 mg by mouth daily.   HYDROcodone-acetaminophen 5-325 MG tablet Commonly known as:  NORCO/VICODIN Take 1 tablet by mouth every 4 (four) hours as needed. What changed:  reasons to take this   multivitamins ther. w/minerals Tabs tablet Take 1 tablet by mouth daily.   CENTRUM SILVER 50+MEN PO Take 1 tablet by mouth daily.   tamsulosin 0.4 MG Caps capsule Commonly known as:  FLOMAX Take 0.4 mg by mouth  every evening.   tiZANidine 2 MG tablet Commonly known as:  ZANAFLEX Take 1 tablet (2 mg total) by mouth every 6 (six) hours as needed for muscle spasms.   traMADol 50 MG tablet Commonly known as:  ULTRAM Take 50 mg by mouth 2 (two) times daily. Maximum dose= 8 tablets per day.  For pain.   verapamil 240 MG CR tablet Commonly known as:  CALAN-SR Take 240 mg by mouth 2 (two) times daily.   vitamin C 500 MG tablet Commonly known as:  ASCORBIC ACID Take 500 mg by mouth daily.            Durable Medical Equipment        Start     Ordered   01/15/17 1355  DME Walker rolling  Once    Question:  Patient needs a walker to treat with the following condition  Answer:  Status post total left knee replacement   01/15/17 1354    01/15/17 1355  DME 3 n 1  Once     01/15/17 1354   01/15/17 1355  DME Bedside commode  Once    Question:  Patient needs a bedside commode to treat with the following condition  Answer:  Status post total left knee replacement   01/15/17 1354       Discharge Care Instructions        Start     Ordered   01/17/17 0000  Call MD / Call 911    Comments:  If you experience chest pain or shortness of breath, CALL 911 and be transported to the hospital emergency room.  If you develope a fever above 101 F, pus (white drainage) or increased drainage or redness at the wound, or calf pain, call your surgeon's office.   01/17/17 0934   01/17/17 0000  Constipation Prevention    Comments:  Drink plenty of fluids.  Prune juice may be helpful.  You may use a stool softener, such as Colace (over the counter) 100 mg twice a day.  Use MiraLax (over the counter) for constipation as needed.   01/17/17 0934   01/17/17 0000  Diet general     01/17/17 0934   01/17/17 0000  Increase activity slowly as tolerated     01/17/17 0934   01/17/17 0000  Weight bearing as tolerated    Question Answer Comment  Laterality left   Extremity Lower      01/17/17 0934   01/17/17 0000  CPM    Comments:  Continuous passive motion machine (CPM):      Use the CPM from 0 to 60 for 8 hours per day.      You may increase by 5-10 per day.  You may break it up into 2 or 3 sessions per day.      Use CPM for 1-2 weeks or until you are told to stop.   01/17/17 0934   01/17/17 0000  Do not put a pillow under the knee. Place it under the heel.     01/17/17 0934   01/15/17 0000  HYDROcodone-acetaminophen (NORCO/VICODIN) 5-325 MG tablet  Every 4 hours PRN     01/15/17 1216   01/15/17 0000  aspirin EC 325 MG tablet  2 times daily     01/15/17 1216   01/15/17 0000  tiZANidine (ZANAFLEX) 2 MG tablet  Every 6 hours PRN     01/15/17 1216      Diagnostic Studies: Dg Chest 2 View  Result Date: 01/13/2017  CLINICAL DATA:  Preop  exam, RIGHT shoulder surgery EXAM: CHEST  2 VIEW COMPARISON:  Radiograph 07/03/2016 FINDINGS: Normal mediastinum and cardiac silhouette. Normal pulmonary vasculature. No evidence of effusion, infiltrate, or pneumothorax. No acute bony abnormality. Degenerative osteophytosis of the spine. Anchor stable inferior to the glenohumeral joint. IMPRESSION: No active cardiopulmonary disease. Electronically Signed   By: Suzy Bouchard M.D.   On: 01/13/2017 16:37   Dg Knee 1-2 Views Left  Result Date: 01/15/2017 CLINICAL DATA:  Status post left knee revision EXAM: LEFT KNEE - 1-2 VIEW COMPARISON:  None. FINDINGS: Knee prosthesis is noted in satisfactory position. Fixation staple is noted laterally within the proximal tibia. No acute abnormality is noted. IMPRESSION: Status post knee revision Electronically Signed   By: Inez Catalina M.D.   On: 01/15/2017 16:22    Disposition: [place].  Discharge Instructions    CPM    Complete by:  As directed    Continuous passive motion machine (CPM):      Use the CPM from 0 to 60 for 8 hours per day.      You may increase by 5-10 per day.  You may break it up into 2 or 3 sessions per day.      Use CPM for 1-2 weeks or until you are told to stop.   Call MD / Call 911    Complete by:  As directed    If you experience chest pain or shortness of breath, CALL 911 and be transported to the hospital emergency room.  If you develope a fever above 101 F, pus (white drainage) or increased drainage or redness at the wound, or calf pain, call your surgeon's office.   Constipation Prevention    Complete by:  As directed    Drink plenty of fluids.  Prune juice may be helpful.  You may use a stool softener, such as Colace (over the counter) 100 mg twice a day.  Use MiraLax (over the counter) for constipation as needed.   Diet general    Complete by:  As directed    Do not put a pillow under the knee. Place it under the heel.    Complete by:  As  directed    Increase activity slowly as tolerated    Complete by:  As directed    Weight bearing as tolerated    Complete by:  As directed    Laterality:  left   Extremity:  Lower      Follow-up Information    Frederik Pear, MD In 2 weeks.   Specialty:  Orthopedic Surgery Contact information: [place] [id number] [phone]            Signed: Erlene Senters 01/17/2017, 9:36 AM

## 2017-01-17 NOTE — Progress Notes (Signed)
CSW met with patient to discuss current needs and discharge plan. Patient is hard of hearing but able to comprehend. CSW and patient discussed desires for discharge, patient stated that he desires to go home. Per PT note, patient ambulated 250 foot today and recommending SNF placement. Patient lacks strong social supports, his sister is 74 and his niece lives in [place]. Patient was able to care for himself prior to his current admission and is an active driver. Patient lives alone. CSW will discuss case with RN CM for possible home health needs.   CSW will continue to follow for discharge needs.  Madilyn Fireman, MSW, LCSW-A Weekend Clinical Social Worker [phone]

## 2017-01-17 NOTE — Progress Notes (Signed)
Physical Therapy Treatment Patient Details Name: Steven Horton MRN: [id number] DOB: May 10, 1930 Today's Date: 01/17/2017    History of Present Illness Pt is an 83 y.o. male now s/p L TKA revision after the first one on 01/15/17 failed. Pertinent PMH includes HTN, DVT, HOH (wears hearing aids), arthritis.    PT Comments    Pt progressing well. Ambulated 250 feet with RW and supervision. Plan is for d/c to SNF for continued progress to independent level.    Follow Up Recommendations  DC plan and follow up therapy as arranged by surgeon;SNF     Equipment Recommendations  Other (comment) (defer to next venue)    Recommendations for Other Services       Precautions / Restrictions Precautions Precautions: Knee Restrictions Weight Bearing Restrictions: Yes LLE Weight Bearing: Weight bearing as tolerated    Mobility  Bed Mobility Overal bed mobility: Needs Assistance Bed Mobility: Supine to Sit     Supine to sit: Min guard;HOB elevated     General bed mobility comments: increased time and effort, +rail  Transfers Overall transfer level: Needs assistance Equipment used: Rolling walker (2 wheeled) Transfers: Sit to/from Stand Sit to Stand: Min guard Stand pivot transfers: Min guard       General transfer comment: increased time to stabilize initial standing balance  Ambulation/Gait Ambulation/Gait assistance: Supervision Ambulation Distance (Feet): 250 Feet Assistive device: Rolling walker (2 wheeled) Gait Pattern/deviations: Step-through pattern;Decreased stride length Gait velocity: Decreased   General Gait Details: slow, steady gait   Stairs            Wheelchair Mobility    Modified Rankin (Stroke Patients Only)       Balance                                            Cognition Arousal/Alertness: Awake/alert Behavior During Therapy: WFL for tasks assessed/performed Overall Cognitive Status: Within Functional Limits for  tasks assessed                                        Exercises Total Joint Exercises Ankle Circles/Pumps: AROM;Both;10 reps Quad Sets: AROM;Left;10 reps Short Arc QuadSinclair Ship;Left;10 reps Heel Slides: AAROM;Left;10 reps Hip ABduction/ADduction: AAROM;Left;10 reps Straight Leg Raises: AAROM;Left;10 reps Goniometric ROM: 0-90 degrees L knee    General Comments        Pertinent Vitals/Pain Pain Assessment: Faces Faces Pain Scale: Hurts a little bit Pain Location: L  knee Pain Descriptors / Indicators: Sore Pain Intervention(s): Monitored during session;Repositioned    Home Living                      Prior Function            PT Goals (current goals can now be found in the care plan section) Acute Rehab PT Goals Patient Stated Goal: Get rehab before going home PT Goal Formulation: With patient Time For Goal Achievement: 01/29/17 Potential to Achieve Goals: Good Progress towards PT goals: Progressing toward goals    Frequency    7X/week      PT Plan Current plan remains appropriate    Co-evaluation              AM-PAC PT "6 Clicks" Daily Activity  Outcome Measure  Difficulty turning over  in bed (including adjusting bedclothes, sheets and blankets)?: A Little Difficulty moving from lying on back to sitting on the side of the bed? : A Little Difficulty sitting down on and standing up from a chair with arms (e.g., wheelchair, bedside commode, etc,.)?: A Little Help needed moving to and from a bed to chair (including a wheelchair)?: A Little Help needed walking in hospital room?: A Little Help needed climbing 3-5 steps with a railing? : A Lot 6 Click Score: 17    End of Session Equipment Utilized During Treatment: Gait belt Activity Tolerance: Patient tolerated treatment well Patient left: in chair;with call bell/phone within reach;with chair alarm set Nurse Communication: Mobility status PT Visit Diagnosis: Other  abnormalities of gait and mobility (R26.89);Pain Pain - Right/Left: Left Pain - part of body: Knee     Time: 5300-5110 PT Time Calculation (min) (ACUTE ONLY): 33 min  Charges:  $Gait Training: 8-22 mins $Therapeutic Exercise: 8-22 mins                    G Codes:       Lorrin Goodell, PT  Office # [phone] Pager [phone]    Lorriane Shire 01/17/2017, 10:52 AM

## 2017-01-17 NOTE — Progress Notes (Signed)
Subjective: 2 Days Post-Op Procedure(s) (LRB): TOTAL KNEE REVISION (Left) Patient reports pain as mild.  Taking by mouth and voiding okay. Ambulated 125 feet yesterday with physical therapy. No complaints.  Objective: Vital signs in last 24 hours: Temp:  [97.9 F (36.6 C)-98.6 F (37 C)] 97.9 F (36.6 C) (09/23 0548) Pulse Rate:  [65-81] 77 (09/23 0758) Resp:  [20] 20 (09/22 1422) BP: (128-156)/(50-83) 149/83 (09/23 0758) SpO2:  [96 %-99 %] 99 % (09/23 0758)  Intake/Output from previous day: 09/22 0701 - 09/23 0700 In: 480 [P.O.:480] Out: 2525 [Urine:2525] Intake/Output this shift: Total I/O In: -  Out: 300 [Urine:300]   Recent Labs  01/16/17 0538 01/17/17 0300  HGB 11.9* 10.7*    Recent Labs  01/16/17 0538 01/17/17 0300  WBC 10.4 9.1  RBC 3.54* 3.40*  HCT 34.7* 33.1*  PLT 182 185    Recent Labs  01/16/17 0538  NA 140  K 4.3  CL 113*  CO2 22  BUN 23*  CREATININE 1.58*  GLUCOSE 118*  CALCIUM 8.6*   No results for input(s): LABPT, INR in the last 72 hours. Left knee exam: Neurovascular intact Sensation intact distally Intact pulses distally Dorsiflexion/Plantar flexion intact Incision: dressing C/D/I Compartment soft  Assessment/Plan: 2 Days Post-Op Procedure(s) (LRB): TOTAL KNEE REVISION (Left) Chronic kidney disease. Stable. Plan: Weight-bear as tolerated on left. Ambulate with walker. Aspirin 325 mg enteric-coated once daily 2 weeks postop. Up with therapy Discharge to SNF today after physical therapy.  Steven Horton 01/17/2017, 9:26 AM

## 2017-01-18 ENCOUNTER — Encounter (HOSPITAL_COMMUNITY): Payer: Self-pay | Admitting: Orthopedic Surgery

## 2017-01-18 DIAGNOSIS — R41841 Cognitive communication deficit: Secondary | ICD-10-CM | POA: Diagnosis not present

## 2017-01-18 DIAGNOSIS — Z471 Aftercare following joint replacement surgery: Secondary | ICD-10-CM | POA: Diagnosis not present

## 2017-01-18 DIAGNOSIS — E559 Vitamin D deficiency, unspecified: Secondary | ICD-10-CM | POA: Diagnosis not present

## 2017-01-18 DIAGNOSIS — M6281 Muscle weakness (generalized): Secondary | ICD-10-CM | POA: Diagnosis not present

## 2017-01-18 DIAGNOSIS — R278 Other lack of coordination: Secondary | ICD-10-CM | POA: Diagnosis not present

## 2017-01-18 DIAGNOSIS — N429 Disorder of prostate, unspecified: Secondary | ICD-10-CM | POA: Diagnosis not present

## 2017-01-18 DIAGNOSIS — Z23 Encounter for immunization: Secondary | ICD-10-CM | POA: Diagnosis not present

## 2017-01-18 DIAGNOSIS — N400 Enlarged prostate without lower urinary tract symptoms: Secondary | ICD-10-CM | POA: Diagnosis not present

## 2017-01-18 DIAGNOSIS — G8911 Acute pain due to trauma: Secondary | ICD-10-CM | POA: Diagnosis not present

## 2017-01-18 DIAGNOSIS — K5900 Constipation, unspecified: Secondary | ICD-10-CM | POA: Diagnosis not present

## 2017-01-18 DIAGNOSIS — R52 Pain, unspecified: Secondary | ICD-10-CM | POA: Diagnosis not present

## 2017-01-18 DIAGNOSIS — R262 Difficulty in walking, not elsewhere classified: Secondary | ICD-10-CM | POA: Diagnosis not present

## 2017-01-18 DIAGNOSIS — Y832 Surgical operation with anastomosis, bypass or graft as the cause of abnormal reaction of the patient, or of later complication, without mention of misadventure at the time of the procedure: Secondary | ICD-10-CM | POA: Diagnosis not present

## 2017-01-18 DIAGNOSIS — S8990XA Unspecified injury of unspecified lower leg, initial encounter: Secondary | ICD-10-CM | POA: Diagnosis not present

## 2017-01-18 DIAGNOSIS — I8002 Phlebitis and thrombophlebitis of superficial vessels of left lower extremity: Secondary | ICD-10-CM | POA: Diagnosis not present

## 2017-01-18 DIAGNOSIS — R600 Localized edema: Secondary | ICD-10-CM | POA: Diagnosis not present

## 2017-01-18 DIAGNOSIS — Z111 Encounter for screening for respiratory tuberculosis: Secondary | ICD-10-CM | POA: Diagnosis not present

## 2017-01-18 DIAGNOSIS — M25562 Pain in left knee: Secondary | ICD-10-CM | POA: Diagnosis not present

## 2017-01-18 DIAGNOSIS — T84093S Other mechanical complication of internal left knee prosthesis, sequela: Secondary | ICD-10-CM | POA: Diagnosis not present

## 2017-01-18 DIAGNOSIS — F338 Other recurrent depressive disorders: Secondary | ICD-10-CM | POA: Diagnosis not present

## 2017-01-18 DIAGNOSIS — I10 Essential (primary) hypertension: Secondary | ICD-10-CM | POA: Diagnosis not present

## 2017-01-18 DIAGNOSIS — M1712 Unilateral primary osteoarthritis, left knee: Secondary | ICD-10-CM | POA: Diagnosis not present

## 2017-01-18 DIAGNOSIS — I82409 Acute embolism and thrombosis of unspecified deep veins of unspecified lower extremity: Secondary | ICD-10-CM | POA: Diagnosis not present

## 2017-01-18 DIAGNOSIS — I82509 Chronic embolism and thrombosis of unspecified deep veins of unspecified lower extremity: Secondary | ICD-10-CM | POA: Diagnosis not present

## 2017-01-18 DIAGNOSIS — Z96652 Presence of left artificial knee joint: Secondary | ICD-10-CM | POA: Diagnosis not present

## 2017-01-18 LAB — CBC
HEMATOCRIT: 34 % — AB (ref 39.0–52.0)
HEMOGLOBIN: 11 g/dL — AB (ref 13.0–17.0)
MCH: 31.8 pg (ref 26.0–34.0)
MCHC: 32.4 g/dL (ref 30.0–36.0)
MCV: 98.3 fL (ref 78.0–100.0)
Platelets: 181 10*3/uL (ref 150–400)
RBC: 3.46 MIL/uL — ABNORMAL LOW (ref 4.22–5.81)
RDW: 14.8 % (ref 11.5–15.5)
WBC: 7.2 10*3/uL (ref 4.0–10.5)

## 2017-01-18 MED ORDER — APIXABAN 2.5 MG PO TABS: 2.5000 mg | ORAL_TABLET | Freq: Two times a day (BID) | ORAL | 0 refills | Status: DC

## 2017-01-18 NOTE — Progress Notes (Signed)
PATIENT ID: Steven Horton  MRN: [id number]  DOB/AGE:  06-07-1930 / 83 y.o.  3 Days Post-Op Procedure(s) (LRB): TOTAL KNEE REVISION (Left)    PROGRESS NOTE Subjective: Patient is alert, oriented, no Nausea, no Vomiting, yes passing gas. Taking PO well. Denies SOB, Chest or Calf Pain. Using Incentive Spirometer, PAS in place. Ambulate WBAT with pt walking 250 ft with therapy, Patient reports pain as mild at rest and increases with activity .    Objective: Vital signs in last 24 hours: Vitals:   01/17/17 0758 01/17/17 1326 01/17/17 2052 01/18/17 0536  BP: (!) 149/83 (!) 132/57 (!) 140/54 (!) 132/59  Pulse: 77 74 82 78  Resp:  17 16 16   Temp:  97.8 F (36.6 C) 99.3 F (37.4 C) 98.2 F (36.8 C)  TempSrc:  Oral Oral Oral  SpO2: 99% 100% 100% 96%      Intake/Output from previous day: I/O last 3 completed shifts: In: 780 [P.O.:780] Out: 2400 [Urine:2400]   Intake/Output this shift: No intake/output data recorded.   LABORATORY DATA:  Recent Labs  01/16/17 0538 01/17/17 0300 01/18/17 0348  WBC 10.4 9.1 7.2  HGB 11.9* 10.7* 11.0*  HCT 34.7* 33.1* 34.0*  PLT 182 185 181  NA 140  --   --   K 4.3  --   --   CL 113*  --   --   CO2 22  --   --   BUN 23*  --   --   CREATININE 1.58*  --   --   GLUCOSE 118*  --   --   CALCIUM 8.6*  --   --     Examination: Neurologically intact Neurovascular intact Sensation intact distally Intact pulses distally Dorsiflexion/Plantar flexion intact Incision: dressing C/D/I No cellulitis present Compartment soft}  Assessment:   3 Days Post-Op Procedure(s) (LRB): TOTAL KNEE REVISION (Left) ADDITIONAL DIAGNOSIS: Expected Acute Blood Loss Anemia, Renal Insufficiency Chronic, history of DVT  Plan: PT/OT WBAT, AROM and PROM  DVT Prophylaxis:  SCDx72hrs, Eliquis 2.5 mg BID x 2 weeks DISCHARGE PLAN: Skilled Nursing Facility/Rehab DISCHARGE NEEDS: HHPT, Walker and 3-in-1 comode seat     Michah Minton R 01/18/2017, 7:21 AM

## 2017-01-18 NOTE — Anesthesia Postprocedure Evaluation (Signed)
Anesthesia Post Note  Patient: Steven Horton  Procedure(s) Performed: Procedure(s) (LRB): TOTAL KNEE REVISION (Left)     Patient location during evaluation: PACU Anesthesia Type: Spinal Level of consciousness: oriented and awake and alert Pain management: pain level controlled Vital Signs Assessment: post-procedure vital signs reviewed and stable Respiratory status: spontaneous breathing, respiratory function stable and patient connected to nasal cannula oxygen Cardiovascular status: blood pressure returned to baseline and stable Postop Assessment: no headache, no backache and no apparent nausea or vomiting Anesthetic complications: no    Last Vitals:  Vitals:   01/18/17 0536 01/18/17 0833  BP: (!) 132/59 (!) 122/48  Pulse: 78   Resp: 16   Temp: 36.8 C   SpO2: 96%     Last Pain:  Vitals:   01/18/17 0536  TempSrc: Oral  PainSc:                  Sandrika Schwinn,JAMES TERRILL

## 2017-01-18 NOTE — Clinical Social Work Placement (Signed)
   CLINICAL SOCIAL WORK PLACEMENT  NOTE  Date:  01/18/2017  Patient Details  Name: Steven Horton MRN: [id number] Date of Birth: 1930-06-13  Clinical Social Work is seeking post-discharge placement for this patient at the [place] level of care (*CSW will initial, date and re-position this form in  chart as items are completed):  Yes   Patient/family provided with Time Work Department's list of facilities offering this level of care within the geographic area requested by the patient (or if unable, by the patient's family).  Yes   Patient/family informed of their freedom to choose among providers that offer the needed level of care, that participate in Medicare, Medicaid or managed care program needed by the patient, have an available bed and are willing to accept the patient.  Yes   Patient/family informed of [facility]'s ownership interest in [facility] and [facility], as well as of the fact that they are under no obligation to receive care at these facilities.  PASRR submitted to EDS on       PASRR number received on 01/18/17     Existing PASRR number confirmed on       FL2 transmitted to all facilities in geographic area requested by pt/family on 01/18/17     FL2 transmitted to all facilities within larger geographic area on       Patient informed that his/her managed care company has contracts with or will negotiate with certain facilities, including the following:        Yes   Patient/family informed of bed offers received.  Patient chooses bed at [facility]     Physician recommends and patient chooses bed at      Patient to be transferred to [facility] on 01/18/17.  Patient to be transferred to facility by [facility]     Patient family notified on 01/18/17 of transfer.  Name of family member notified:  responsible for self     PHYSICIAN Please sign FL2, Please prepare prescriptions     Additional Comment:     _______________________________________________ [name], LCSW 01/18/2017, 12:00 PM

## 2017-01-18 NOTE — [place] FL2 (Signed)
[place] MEDICAID FL2 LEVEL OF CARE SCREENING TOOL     IDENTIFICATION  Patient Name: Steven Horton Birthdate: 18-Jan-1931 Sex: male Admission Date (Current Location): 01/15/2017  [facility] and [place] Number:  Herbalist and Address:  [facility]. [facility], [place] [address] [place], [place] [address]      Provider Number: [id number]  Attending Physician Name and Address:  Frederik Pear, MD  Relative Name and Phone Number:  Sherry Ruffing, sister, [phone]    Current Level of Care: Hospital Recommended Level of Care: [place] Prior Approval Number:    Date Approved/Denied: 01/18/17 PASRR Number: [id number]  Discharge Plan: Home    Current Diagnoses: Patient Active Problem List   Diagnosis Date Noted  . Primary osteoarthritis of left knee 01/15/2017  . Failed total knee, left ([place]) 01/14/2017  . Phlebitis and thrombophlebitis of other deep vessels of lower extremities 02/10/2013  . Leg swelling 02/10/2013  . Pain in joint, lower leg 02/10/2013  . DVT (deep venous thrombosis) ([place]) 02/10/2013  . Baker's cyst, ruptured 02/10/2013    Orientation RESPIRATION BLADDER Height & Weight     Self, Time, Situation, Place  Normal Continent Weight:   Height:     BEHAVIORAL SYMPTOMS/MOOD NEUROLOGICAL BOWEL NUTRITION STATUS      Continent Diet (See DC Summary)  AMBULATORY STATUS COMMUNICATION OF NEEDS Skin   Limited Assist Verbally Surgical wounds (Left Leg, Silver Hydrofiber)                       Personal Care Assistance Level of Assistance  Dressing, Feeding Bathing Assistance: Maximum assistance Feeding assistance: Limited assistance Dressing Assistance: Maximum assistance     Functional Limitations Info  Hearing   Hearing Info: Impaired      SPECIAL CARE FACTORS FREQUENCY  PT (By licensed PT), OT (By licensed OT)     PT Frequency: 7x week OT Frequency: 2x week            Contractures      Additional  Factors Info  Code Status, Allergies Code Status Info: Full Code Allergies Info: No Known Allergies           Current Medications (01/18/2017):  This is the current hospital active medication list Current Facility-Administered Medications  Medication Dose Route Frequency Provider Last Rate Last Dose  . acetaminophen (TYLENOL) tablet 650 mg  650 mg Oral Q6H PRN Leighton Parody, PA-C   650 mg at 01/15/17 1821   Or  . acetaminophen (TYLENOL) suppository 650 mg  650 mg Rectal Q6H PRN Leighton Parody, PA-C      . alum & mag hydroxide-simeth (MAALOX/MYLANTA) 200-200-20 MG/5ML suspension 30 mL  30 mL Oral Q4H PRN Leighton Parody, PA-C      . aspirin EC tablet 325 mg  325 mg Oral Q breakfast Leighton Parody, PA-C   325 mg at 01/18/17 3235  . bisacodyl (DULCOLAX) EC tablet 5 mg  5 mg Oral Daily PRN Joanell Rising K, PA-C      . dextrose 5 % and 0.45 % NaCl with KCl 20 mEq/L infusion   Intravenous Continuous Leighton Parody, PA-C 125 mL/hr at 01/17/17 0930    . diphenhydrAMINE (BENADRYL) 12.5 MG/5ML elixir 12.5-25 mg  12.5-25 mg Oral Q4H PRN Leighton Parody, PA-C      . docusate sodium (COLACE) capsule 100 mg  100 mg Oral QPM Joanell Rising K, PA-C   100 mg at 01/17/17 1756  .  fenofibrate tablet 160 mg  160 mg Oral Daily Leighton Parody, PA-C   160 mg at 01/18/17 3338  . gabapentin (NEURONTIN) capsule 300 mg  300 mg Oral TID Leighton Parody, PA-C   300 mg at 01/18/17 0836  . HYDROmorphone (DILAUDID) injection 0.5 mg  0.5 mg Intravenous Q2H PRN Leighton Parody, PA-C      . menthol-cetylpyridinium (CEPACOL) lozenge 3 mg  1 lozenge Oral PRN Leighton Parody, PA-C       Or  . phenol (CHLORASEPTIC) mouth spray 1 spray  1 spray Mouth/Throat PRN Leighton Parody, PA-C      . methocarbamol (ROBAXIN) tablet 500 mg  500 mg Oral Q6H PRN Leighton Parody, PA-C       Or  . methocarbamol (ROBAXIN) 500 mg in dextrose 5 % 50 mL IVPB  500 mg Intravenous Q6H PRN Leighton Parody, PA-C      . metoCLOPramide  (REGLAN) tablet 5-10 mg  5-10 mg Oral Q8H PRN Leighton Parody, PA-C       Or  . metoCLOPramide (REGLAN) injection 5-10 mg  5-10 mg Intravenous Q8H PRN Leighton Parody, PA-C      . ondansetron [facility]) tablet 4 mg  4 mg Oral Q6H PRN Leighton Parody, PA-C       Or  . ondansetron [facility]) injection 4 mg  4 mg Intravenous Q6H PRN Leighton Parody, PA-C      . oxyCODONE (Oxy IR/ROXICODONE) immediate release tablet 5-10 mg  5-10 mg Oral Q3H PRN Leighton Parody, PA-C      . senna-docusate (Senokot-S) tablet 1 tablet  1 tablet Oral QHS PRN Leighton Parody, PA-C      . sodium phosphate (FLEET) 7-19 GM/118ML enema 1 enema  1 enema Rectal Once PRN Leighton Parody, PA-C      . tamsulosin [facility]) capsule 0.4 mg  0.4 mg Oral QPM Joanell Rising K, PA-C   0.4 mg at 01/17/17 1756  . verapamil (CALAN-SR) CR tablet 240 mg  240 mg Oral BID Leighton Parody, PA-C   240 mg at 01/18/17 3291     Discharge Medications: Please see discharge summary for a list of discharge medications.  Relevant Imaging Results:  Relevant Lab Results:   Additional Information SS#: [id number]  [name], LCSW

## 2017-01-18 NOTE — Social Work (Addendum)
Clinical Social Worker facilitated patient discharge including contacting patient family and facility to confirm patient discharge plans.  Clinical information faxed to facility and family agreeable with plan.    CSW arranged ambulance transport via [facility] to [facility] at 4:00pm.   RN to call [phone] to give report prior to discharge.  Clinical Social Worker will sign off for now as social work intervention is no longer needed. Please consult [place] again if new need arises.  Elissa Hefty, LCSW Clinical Social Worker [phone]

## 2017-01-18 NOTE — Clinical Social Work Note (Signed)
Clinical Social Work Assessment  Patient Details  Name: Steven Horton MRN: [id number] Date of Birth: 06-Sep-1930  Date of referral:  01/18/17               Reason for consult:  Facility Placement                Permission sought to share information with:  Chartered certified accountant granted to share information::  Yes, Verbal Permission Granted  Name::        Agency::  [facility]  Relationship::     Contact Information:     Housing/Transportation Living arrangements for the past 2 months:  Single Family Home Source of Information:  Patient Patient Interpreter Needed:  None Criminal Activity/Legal Involvement Pertinent to Current Situation/Hospitalization:  No - Comment as needed Significant Relationships:  None Lives with:  Self Do you feel safe going back to the place where you live?  No Need for family participation in patient care:  No (Coment)  Care giving concerns:  Patient from home and reports that he was independent prior to hospitalization.  He used a cane to ambulate at home. Pt pre-registered with [facility] last week for SNF placement as he resided alone and has no assistance given new impairment.  Social Worker assessment / plan:  CSW met with patient to discuss clinical teams recommendation for SNF. Pt in agreement and pre-registered with SNF. CSW explained her role and SNF options/placment.  Pt gave permission to send to [facility]. CSW will complete-FL2, passr and send offers.  Employment status:  Retired Forensic scientist:  Medicare PT Recommendations:  LTAC Information / Referral to community resources:  [place]  Patient/Family's Response to care:  Patient appreciative of CSW assistance. No issues identified.  Patient/Family's Understanding of and Emotional Response to Diagnosis, Current Treatment, and Prognosis:  Patient has good understanding of diagnosis, current treatment and prognosis. Pt hopeful he will improve  with short term rehab and get back home to his independence. No issues or concerns identified.  Emotional Assessment Appearance:  Appears stated age Attitude/Demeanor/Rapport:   (Cooperative) Affect (typically observed):  Accepting, Appropriate Orientation:  Oriented to Situation, Oriented to  Time, Oriented to Place, Oriented to Self Alcohol / Substance use:  Not Applicable Psych involvement (Current and /or in the community):  No (Comment)  Discharge Needs  Concerns to be addressed:  Care Coordination Readmission within the last 30 days:  No Current discharge risk:  Dependent with Mobility, Physical Impairment Barriers to Discharge:  No Barriers Identified   [name], LCSW 01/18/2017, 11:42 AM

## 2017-01-18 NOTE — Progress Notes (Signed)
Physical Therapy Treatment Patient Details Name: Steven Horton MRN: [id number] DOB: 1931/01/20 Today's Date: 01/18/2017    History of Present Illness Pt is an 83 y.o. male now s/p L TKA revision after the first one on 01/15/17 failed. Pertinent PMH includes HTN, DVT, HOH (wears hearing aids), arthritis.    PT Comments    Pt tolerating mobility well however con't to required assist for transfers and ADls. Pt with SOB with ambulation but SpO2 at 99% on RA and HR at 99 as well. Pt remains unsafe to return home alone at this time and would benefit from ST-SNF to achieve safe mod I level of function for safe transition home alone.  Follow Up Recommendations  DC plan and follow up therapy as arranged by surgeon;SNF     Equipment Recommendations       Recommendations for Other Services       Precautions / Restrictions Precautions Precautions: Knee Precaution Booklet Issued: No Precaution Comments: Verbally reviewed precautions + resting knee in ext (use of bone foam) Restrictions Weight Bearing Restrictions: Yes LLE Weight Bearing: Weight bearing as tolerated    Mobility  Bed Mobility Overal bed mobility: Needs Assistance Bed Mobility: Supine to Sit     Supine to sit: Min guard     General bed mobility comments: increased time, HOB elevated and use of bed rail  Transfers Overall transfer level: Needs assistance Equipment used: Rolling walker (2 wheeled) Transfers: Sit to/from Stand Sit to Stand: Min guard         General transfer comment: increased time, v/c's for safe hand placement,   Ambulation/Gait Ambulation/Gait assistance: Min guard Ambulation Distance (Feet): 175 Feet Assistive device: Rolling walker (2 wheeled) Gait Pattern/deviations: Step-through pattern;Decreased stride length Gait velocity: dec Gait velocity interpretation: Below normal speed for age/gender General Gait Details: slow, steady gait, emphasis on con't fluidity and instead of step to  gait pattern. pt required 3 standing rest breaks. pt with c/o of bilat UE pain and fatigue. pt encouraged to increased bilat LE [place]            Wheelchair Mobility    Modified Rankin (Stroke Patients Only)       Balance Overall balance assessment: Needs assistance Sitting-balance support: No upper extremity supported Sitting balance-Leahy Scale: Good Sitting balance - Comments: able to sit EOB with no back support   Standing balance support: During functional activity Standing balance-Leahy Scale: Fair Standing balance comment: pt able to stand at sink and wash hands without difficulty, pt did place elbows on sink                            Cognition Arousal/Alertness: Awake/alert Behavior During Therapy: WFL for tasks assessed/performed Overall Cognitive Status: Within Functional Limits for tasks assessed                                        Exercises Total Joint Exercises Ankle Circles/Pumps: AROM;Both;10 reps Quad Sets: AROM;Left;10 reps Heel Slides: AAROM;Left;10 reps Long Arc Quad: AROM;Strengthening;Left;10 reps;Seated    General Comments        Pertinent Vitals/Pain Pain Assessment: 0-10 Pain Score: 3  Pain Location: L knee Pain Descriptors / Indicators: Sore Pain Intervention(s): Monitored during session    Home Living  Prior Function            PT Goals (current goals can now be found in the care plan section) Progress towards PT goals: Progressing toward goals    Frequency    7X/week      PT Plan Current plan remains appropriate    Co-evaluation              AM-PAC PT "6 Clicks" Daily Activity  Outcome Measure  Difficulty turning over in bed (including adjusting bedclothes, sheets and blankets)?: A Lot Difficulty moving from lying on back to sitting on the side of the bed? : A Little Difficulty sitting down on and standing up from a chair with arms (e.g.,  wheelchair, bedside commode, etc,.)?: A Little Help needed moving to and from a bed to chair (including a wheelchair)?: A Little Help needed walking in hospital room?: A Little Help needed climbing 3-5 steps with a railing? : A Lot 6 Click Score: 16    End of Session Equipment Utilized During Treatment: Gait belt Activity Tolerance: Patient tolerated treatment well Patient left: in chair;with call bell/phone within reach;with chair alarm set Nurse Communication: Mobility status PT Visit Diagnosis: Other abnormalities of gait and mobility (R26.89);Pain Pain - Right/Left: Left Pain - part of body: Knee     Time: 7121-9758 PT Time Calculation (min) (ACUTE ONLY): 31 min  Charges:  $Gait Training: 8-22 mins $Therapeutic Exercise: 8-22 mins                    G Codes:       [name], PT, DPT Pager #: [phone] Office #: [phone]    [place] 01/18/2017, 10:22 AM

## 2017-01-18 NOTE — Progress Notes (Signed)
PTAR arrived at the bedside. All belongings packed, iv out, papers and prescriptions sent with PTAR.

## 2017-01-18 NOTE — Social Work (Signed)
Pt pre-registered with [facility].  CSW confirmed SNF placement and they are ready for patient once DC is completed.  CSW will f/u.  Elissa Hefty, LCSW Clinical Social Worker [phone]

## 2017-01-18 NOTE — Discharge Summary (Signed)
Patient ID: Steven Horton MRN: [id number] DOB/AGE: [birth year] 83 y.o.  Admit date: 01/15/2017 Discharge date: 01/18/2017  Admission Diagnoses:  Principal Problem:   Failed total knee, left ([place]) Active Problems:   Primary osteoarthritis of left knee   Discharge Diagnoses:  Same  Past Medical History:  Diagnosis Date  . Arthritis   . DVT (deep venous thrombosis) ([place])   . HOH (hard of hearing)   . Hypertension   . Stomach ulcer     Surgeries: Procedure(s): TOTAL KNEE REVISION on 01/15/2017   Consultants:   Discharged Condition: Improved  Hospital Course: Steven Horton is an 83 y.o. male who was admitted 01/15/2017 for operative treatment ofFailed total knee, left ([place]). Patient has severe unremitting pain that affects sleep, daily activities, and work/hobbies. After pre-op clearance the patient was taken to the operating room on 01/15/2017 and underwent  Procedure(s): TOTAL KNEE REVISION.    Patient was given perioperative antibiotics: Anti-infectives    Start     Dose/Rate Route Frequency Ordered Stop   01/15/17 0930  ceFAZolin (ANCEF) IVPB 2g/100 mL premix     2 g 200 mL/hr over 30 Minutes Intravenous To [facility]Stay Surgical 01/14/17 1100 01/15/17 1015       Patient was given sequential compression devices, early ambulation, and chemoprophylaxis to prevent DVT.  Patient benefited maximally from hospital stay and there were no complications.    Recent vital signs: Patient Vitals for the past 24 hrs:  BP Temp Temp src Pulse Resp SpO2  01/18/17 0536 (!) 132/59 98.2 F (36.8 C) Oral 78 16 96 %  01/17/17 2052 (!) 140/54 99.3 F (37.4 C) Oral 82 16 100 %  01/17/17 1326 (!) 132/57 97.8 F (36.6 C) Oral 74 17 100 %  01/17/17 0758 (!) 149/83 - - 77 - 99 %     Recent laboratory studies:  Recent Labs  01/16/17 0538 01/17/17 0300 01/18/17 0348  WBC 10.4 9.1 7.2  HGB 11.9* 10.7* 11.0*  HCT 34.7* 33.1* 34.0*  PLT 182 185 181  NA 140  --   --   K 4.3  --   --    CL 113*  --   --   CO2 22  --   --   BUN 23*  --   --   CREATININE 1.58*  --   --   GLUCOSE 118*  --   --   CALCIUM 8.6*  --   --      Discharge Medications:   Allergies as of 01/18/2017   No Known Allergies     Medication List    STOP taking these medications   aspirin EC 81 MG tablet   doxycycline 100 MG tablet Commonly known as:  VIBRA-TABS     TAKE these medications   apixaban 2.5 MG Tabs tablet Commonly known as:  ELIQUIS Take 1 tablet (2.5 mg total) by mouth 2 (two) times daily.   DERMEND BRUISE FORMULA EX Apply 1 application topically as needed (bruising).   docusate sodium 100 MG capsule Commonly known as:  COLACE Take 100 mg by mouth every evening.   fenofibrate 160 MG tablet Take 160 mg by mouth daily.   HYDROcodone-acetaminophen 5-325 MG tablet Commonly known as:  NORCO/VICODIN Take 1 tablet by mouth every 4 (four) hours as needed. What changed:  reasons to take this   multivitamins ther. w/minerals Tabs tablet Take 1 tablet by mouth daily.   CENTRUM SILVER 50+MEN PO Take 1 tablet by mouth daily.   tamsulosin 0.4  MG Caps capsule Commonly known as:  FLOMAX Take 0.4 mg by mouth every evening.   tiZANidine 2 MG tablet Commonly known as:  ZANAFLEX Take 1 tablet (2 mg total) by mouth every 6 (six) hours as needed for muscle spasms.   traMADol 50 MG tablet Commonly known as:  ULTRAM Take 50 mg by mouth 2 (two) times daily. Maximum dose= 8 tablets per day.  For pain.   verapamil 240 MG CR tablet Commonly known as:  CALAN-SR Take 240 mg by mouth 2 (two) times daily.   vitamin C 500 MG tablet Commonly known as:  ASCORBIC ACID Take 500 mg by mouth daily.            Durable Medical Equipment        Start     Ordered   01/15/17 1355  DME Walker rolling  Once    Question:  Patient needs a walker to treat with the following condition  Answer:  Status post total left knee replacement   01/15/17 1354   01/15/17 1355  DME 3 n 1  Once      01/15/17 1354   01/15/17 1355  DME Bedside commode  Once    Question:  Patient needs a bedside commode to treat with the following condition  Answer:  Status post total left knee replacement   01/15/17 1354       Discharge Care Instructions        Start     Ordered   01/18/17 0000  apixaban (ELIQUIS) 2.5 MG TABS tablet  2 times daily     01/18/17 0723   01/17/17 0000  Call MD / Call 911    Comments:  If you experience chest pain or shortness of breath, CALL 911 and be transported to the hospital emergency room.  If you develope a fever above 101 F, pus (white drainage) or increased drainage or redness at the wound, or calf pain, call your surgeon's office.   01/17/17 0934   01/17/17 0000  Constipation Prevention    Comments:  Drink plenty of fluids.  Prune juice may be helpful.  You may use a stool softener, such as Colace (over the counter) 100 mg twice a day.  Use MiraLax (over the counter) for constipation as needed.   01/17/17 0934   01/17/17 0000  Diet general     01/17/17 0934   01/17/17 0000  Increase activity slowly as tolerated     01/17/17 0934   01/17/17 0000  Weight bearing as tolerated    Question Answer Comment  Laterality left   Extremity Lower      01/17/17 0934   01/17/17 0000  CPM    Comments:  Continuous passive motion machine (CPM):      Use the CPM from 0 to 60 for 8 hours per day.      You may increase by 5-10 per day.  You may break it up into 2 or 3 sessions per day.      Use CPM for 1-2 weeks or until you are told to stop.   01/17/17 0934   01/17/17 0000  Do not put a pillow under the knee. Place it under the heel.     01/17/17 0934   01/15/17 0000  HYDROcodone-acetaminophen (NORCO/VICODIN) 5-325 MG tablet  Every 4 hours PRN     01/15/17 1216   01/15/17 0000  tiZANidine (ZANAFLEX) 2 MG tablet  Every 6 hours PRN     01/15/17 1216  Diagnostic Studies: Dg Chest 2 View  Result Date: 01/13/2017 CLINICAL DATA:  Preop exam, RIGHT shoulder  surgery EXAM: CHEST  2 VIEW COMPARISON:  Radiograph 07/03/2016 FINDINGS: Normal mediastinum and cardiac silhouette. Normal pulmonary vasculature. No evidence of effusion, infiltrate, or pneumothorax. No acute bony abnormality. Degenerative osteophytosis of the spine. Anchor stable inferior to the glenohumeral joint. IMPRESSION: No active cardiopulmonary disease. Electronically Signed   By: Suzy Bouchard M.D.   On: 01/13/2017 16:37   Dg Knee 1-2 Views Left  Result Date: 01/15/2017 CLINICAL DATA:  Status post left knee revision EXAM: LEFT KNEE - 1-2 VIEW COMPARISON:  None. FINDINGS: Knee prosthesis is noted in satisfactory position. Fixation staple is noted laterally within the proximal tibia. No acute abnormality is noted. IMPRESSION: Status post knee revision Electronically Signed   By: Inez Catalina M.D.   On: 01/15/2017 16:22    Disposition: 01-Home or Self Care  Discharge Instructions    CPM    Complete by:  As directed    Continuous passive motion machine (CPM):      Use the CPM from 0 to 60 for 8 hours per day.      You may increase by 5-10 per day.  You may break it up into 2 or 3 sessions per day.      Use CPM for 1-2 weeks or until you are told to stop.   Call MD / Call 911    Complete by:  As directed    If you experience chest pain or shortness of breath, CALL 911 and be transported to the hospital emergency room.  If you develope a fever above 101 F, pus (white drainage) or increased drainage or redness at the wound, or calf pain, call your surgeon's office.   Constipation Prevention    Complete by:  As directed    Drink plenty of fluids.  Prune juice may be helpful.  You may use a stool softener, such as Colace (over the counter) 100 mg twice a day.  Use MiraLax (over the counter) for constipation as needed.   Diet general    Complete by:  As directed    Do not put a pillow under the knee. Place it under the heel.    Complete by:  As directed    Increase activity slowly as  tolerated    Complete by:  As directed    Weight bearing as tolerated    Complete by:  As directed    Laterality:  left   Extremity:  Lower      Follow-up Information    Frederik Pear, MD In 2 weeks.   Specialty:  Orthopedic Surgery Contact information: [place] [address] [phone]            Signed: Hardin Negus Roise Emert R 01/18/2017, 7:24 AM

## 2017-01-19 DIAGNOSIS — I10 Essential (primary) hypertension: Secondary | ICD-10-CM | POA: Diagnosis not present

## 2017-01-19 DIAGNOSIS — T84093S Other mechanical complication of internal left knee prosthesis, sequela: Secondary | ICD-10-CM | POA: Diagnosis not present

## 2017-01-19 DIAGNOSIS — N400 Enlarged prostate without lower urinary tract symptoms: Secondary | ICD-10-CM | POA: Diagnosis not present

## 2017-01-19 DIAGNOSIS — I82509 Chronic embolism and thrombosis of unspecified deep veins of unspecified lower extremity: Secondary | ICD-10-CM | POA: Diagnosis not present

## 2017-01-20 DIAGNOSIS — R600 Localized edema: Secondary | ICD-10-CM | POA: Diagnosis not present

## 2017-01-20 LAB — AEROBIC/ANAEROBIC CULTURE (SURGICAL/DEEP WOUND): CULTURE: NO GROWTH

## 2017-01-20 LAB — AEROBIC/ANAEROBIC CULTURE W GRAM STAIN (SURGICAL/DEEP WOUND): Gram Stain: NONE SEEN

## 2017-01-28 DIAGNOSIS — Z96652 Presence of left artificial knee joint: Secondary | ICD-10-CM | POA: Diagnosis not present

## 2017-01-28 DIAGNOSIS — Z471 Aftercare following joint replacement surgery: Secondary | ICD-10-CM | POA: Diagnosis not present

## 2017-01-28 DIAGNOSIS — M25562 Pain in left knee: Secondary | ICD-10-CM | POA: Diagnosis not present

## 2017-02-24 DIAGNOSIS — Z96652 Presence of left artificial knee joint: Secondary | ICD-10-CM | POA: Diagnosis not present

## 2017-02-24 DIAGNOSIS — Z9889 Other specified postprocedural states: Secondary | ICD-10-CM | POA: Diagnosis not present

## 2017-03-03 DIAGNOSIS — Z471 Aftercare following joint replacement surgery: Secondary | ICD-10-CM | POA: Diagnosis not present

## 2017-03-03 DIAGNOSIS — M25562 Pain in left knee: Secondary | ICD-10-CM | POA: Diagnosis not present

## 2017-03-03 DIAGNOSIS — I8312 Varicose veins of left lower extremity with inflammation: Secondary | ICD-10-CM | POA: Diagnosis not present

## 2017-03-03 DIAGNOSIS — I8311 Varicose veins of right lower extremity with inflammation: Secondary | ICD-10-CM | POA: Diagnosis not present

## 2017-03-03 DIAGNOSIS — Z96652 Presence of left artificial knee joint: Secondary | ICD-10-CM | POA: Diagnosis not present

## 2017-03-08 DIAGNOSIS — M25562 Pain in left knee: Secondary | ICD-10-CM | POA: Diagnosis not present

## 2017-03-08 DIAGNOSIS — Z96652 Presence of left artificial knee joint: Secondary | ICD-10-CM | POA: Diagnosis not present

## 2017-03-12 DIAGNOSIS — Z96652 Presence of left artificial knee joint: Secondary | ICD-10-CM | POA: Diagnosis not present

## 2017-03-12 DIAGNOSIS — M25562 Pain in left knee: Secondary | ICD-10-CM | POA: Diagnosis not present

## 2017-03-17 DIAGNOSIS — R351 Nocturia: Secondary | ICD-10-CM | POA: Diagnosis not present

## 2017-03-17 DIAGNOSIS — E7800 Pure hypercholesterolemia, unspecified: Secondary | ICD-10-CM | POA: Diagnosis not present

## 2017-03-17 DIAGNOSIS — I700 Atherosclerosis of aorta: Secondary | ICD-10-CM | POA: Diagnosis not present

## 2017-03-17 DIAGNOSIS — Z77090 Contact with and (suspected) exposure to asbestos: Secondary | ICD-10-CM | POA: Diagnosis not present

## 2017-03-17 DIAGNOSIS — I10 Essential (primary) hypertension: Secondary | ICD-10-CM | POA: Diagnosis not present

## 2017-03-17 DIAGNOSIS — Z0000 Encounter for general adult medical examination without abnormal findings: Secondary | ICD-10-CM | POA: Diagnosis not present

## 2017-03-22 DIAGNOSIS — Z96652 Presence of left artificial knee joint: Secondary | ICD-10-CM | POA: Diagnosis not present

## 2017-03-22 DIAGNOSIS — M25562 Pain in left knee: Secondary | ICD-10-CM | POA: Diagnosis not present

## 2017-03-26 DIAGNOSIS — Z96652 Presence of left artificial knee joint: Secondary | ICD-10-CM | POA: Diagnosis not present

## 2017-03-26 DIAGNOSIS — M25562 Pain in left knee: Secondary | ICD-10-CM | POA: Diagnosis not present

## 2017-03-29 DIAGNOSIS — M25562 Pain in left knee: Secondary | ICD-10-CM | POA: Diagnosis not present

## 2017-03-29 DIAGNOSIS — Z96652 Presence of left artificial knee joint: Secondary | ICD-10-CM | POA: Diagnosis not present

## 2017-04-02 DIAGNOSIS — Z96652 Presence of left artificial knee joint: Secondary | ICD-10-CM | POA: Diagnosis not present

## 2017-04-02 DIAGNOSIS — M25562 Pain in left knee: Secondary | ICD-10-CM | POA: Diagnosis not present

## 2017-04-08 DIAGNOSIS — J209 Acute bronchitis, unspecified: Secondary | ICD-10-CM | POA: Diagnosis not present

## 2017-04-15 DIAGNOSIS — Z471 Aftercare following joint replacement surgery: Secondary | ICD-10-CM | POA: Diagnosis not present

## 2017-04-15 DIAGNOSIS — Z96652 Presence of left artificial knee joint: Secondary | ICD-10-CM | POA: Diagnosis not present

## 2017-07-14 DIAGNOSIS — M25562 Pain in left knee: Secondary | ICD-10-CM | POA: Diagnosis not present

## 2017-07-14 DIAGNOSIS — Z09 Encounter for follow-up examination after completed treatment for conditions other than malignant neoplasm: Secondary | ICD-10-CM | POA: Diagnosis not present

## 2017-07-14 DIAGNOSIS — Z96652 Presence of left artificial knee joint: Secondary | ICD-10-CM | POA: Diagnosis not present

## 2017-10-25 DIAGNOSIS — L570 Actinic keratosis: Secondary | ICD-10-CM | POA: Diagnosis not present

## 2017-10-25 DIAGNOSIS — L819 Disorder of pigmentation, unspecified: Secondary | ICD-10-CM | POA: Diagnosis not present

## 2017-11-05 DIAGNOSIS — M79672 Pain in left foot: Secondary | ICD-10-CM | POA: Diagnosis not present

## 2017-11-05 DIAGNOSIS — L03032 Cellulitis of left toe: Secondary | ICD-10-CM | POA: Diagnosis not present

## 2017-11-19 DIAGNOSIS — M79674 Pain in right toe(s): Secondary | ICD-10-CM | POA: Diagnosis not present

## 2017-11-19 DIAGNOSIS — M79675 Pain in left toe(s): Secondary | ICD-10-CM | POA: Diagnosis not present

## 2017-12-03 DIAGNOSIS — R197 Diarrhea, unspecified: Secondary | ICD-10-CM | POA: Diagnosis not present

## 2017-12-03 DIAGNOSIS — R350 Frequency of micturition: Secondary | ICD-10-CM | POA: Diagnosis not present

## 2017-12-06 DIAGNOSIS — R197 Diarrhea, unspecified: Secondary | ICD-10-CM | POA: Diagnosis not present

## 2017-12-28 DIAGNOSIS — M25562 Pain in left knee: Secondary | ICD-10-CM | POA: Diagnosis not present

## 2017-12-30 DIAGNOSIS — A0472 Enterocolitis due to Clostridium difficile, not specified as recurrent: Secondary | ICD-10-CM | POA: Diagnosis not present

## 2017-12-30 DIAGNOSIS — Z09 Encounter for follow-up examination after completed treatment for conditions other than malignant neoplasm: Secondary | ICD-10-CM | POA: Diagnosis not present

## 2018-01-20 DIAGNOSIS — Z23 Encounter for immunization: Secondary | ICD-10-CM | POA: Diagnosis not present

## 2018-01-26 DIAGNOSIS — D229 Melanocytic nevi, unspecified: Secondary | ICD-10-CM | POA: Diagnosis not present

## 2018-01-26 DIAGNOSIS — D1801 Hemangioma of skin and subcutaneous tissue: Secondary | ICD-10-CM | POA: Diagnosis not present

## 2018-01-26 DIAGNOSIS — L821 Other seborrheic keratosis: Secondary | ICD-10-CM | POA: Diagnosis not present

## 2018-01-26 DIAGNOSIS — L814 Other melanin hyperpigmentation: Secondary | ICD-10-CM | POA: Diagnosis not present

## 2018-01-26 DIAGNOSIS — L819 Disorder of pigmentation, unspecified: Secondary | ICD-10-CM | POA: Diagnosis not present

## 2018-04-28 DIAGNOSIS — E7800 Pure hypercholesterolemia, unspecified: Secondary | ICD-10-CM | POA: Diagnosis not present

## 2018-04-28 DIAGNOSIS — I10 Essential (primary) hypertension: Secondary | ICD-10-CM | POA: Diagnosis not present

## 2018-04-29 DIAGNOSIS — N183 Chronic kidney disease, stage 3 (moderate): Secondary | ICD-10-CM | POA: Diagnosis not present

## 2018-04-29 DIAGNOSIS — Z1389 Encounter for screening for other disorder: Secondary | ICD-10-CM | POA: Diagnosis not present

## 2018-04-29 DIAGNOSIS — Z0000 Encounter for general adult medical examination without abnormal findings: Secondary | ICD-10-CM | POA: Diagnosis not present

## 2018-04-29 DIAGNOSIS — E7800 Pure hypercholesterolemia, unspecified: Secondary | ICD-10-CM | POA: Diagnosis not present

## 2018-04-29 DIAGNOSIS — I10 Essential (primary) hypertension: Secondary | ICD-10-CM | POA: Diagnosis not present

## 2018-04-29 DIAGNOSIS — R351 Nocturia: Secondary | ICD-10-CM | POA: Diagnosis not present

## 2018-04-29 DIAGNOSIS — Z658 Other specified problems related to psychosocial circumstances: Secondary | ICD-10-CM | POA: Diagnosis not present

## 2018-04-29 DIAGNOSIS — Z77090 Contact with and (suspected) exposure to asbestos: Secondary | ICD-10-CM | POA: Diagnosis not present

## 2018-04-29 DIAGNOSIS — I700 Atherosclerosis of aorta: Secondary | ICD-10-CM | POA: Diagnosis not present

## 2018-06-17 DIAGNOSIS — S51011A Laceration without foreign body of right elbow, initial encounter: Secondary | ICD-10-CM | POA: Diagnosis not present

## 2018-06-19 DIAGNOSIS — S56921D Laceration of unspecified muscles, fascia and tendons at forearm level, right arm, subsequent encounter: Secondary | ICD-10-CM | POA: Diagnosis not present

## 2018-06-23 DIAGNOSIS — S56921D Laceration of unspecified muscles, fascia and tendons at forearm level, right arm, subsequent encounter: Secondary | ICD-10-CM | POA: Diagnosis not present

## 2018-07-07 DIAGNOSIS — Z96652 Presence of left artificial knee joint: Secondary | ICD-10-CM | POA: Diagnosis not present

## 2018-07-07 DIAGNOSIS — M25562 Pain in left knee: Secondary | ICD-10-CM | POA: Diagnosis not present

## 2018-08-08 DIAGNOSIS — E7800 Pure hypercholesterolemia, unspecified: Secondary | ICD-10-CM | POA: Diagnosis not present

## 2018-08-08 DIAGNOSIS — R351 Nocturia: Secondary | ICD-10-CM | POA: Diagnosis not present

## 2018-08-08 DIAGNOSIS — N183 Chronic kidney disease, stage 3 (moderate): Secondary | ICD-10-CM | POA: Diagnosis not present

## 2018-08-08 DIAGNOSIS — Z0000 Encounter for general adult medical examination without abnormal findings: Secondary | ICD-10-CM | POA: Diagnosis not present

## 2018-08-08 DIAGNOSIS — I10 Essential (primary) hypertension: Secondary | ICD-10-CM | POA: Diagnosis not present

## 2018-08-08 DIAGNOSIS — Z77090 Contact with and (suspected) exposure to asbestos: Secondary | ICD-10-CM | POA: Diagnosis not present

## 2018-08-08 DIAGNOSIS — I700 Atherosclerosis of aorta: Secondary | ICD-10-CM | POA: Diagnosis not present

## 2018-08-17 DIAGNOSIS — E785 Hyperlipidemia, unspecified: Secondary | ICD-10-CM | POA: Diagnosis not present

## 2018-08-17 DIAGNOSIS — I129 Hypertensive chronic kidney disease with stage 1 through stage 4 chronic kidney disease, or unspecified chronic kidney disease: Secondary | ICD-10-CM | POA: Diagnosis not present

## 2018-08-17 DIAGNOSIS — D631 Anemia in chronic kidney disease: Secondary | ICD-10-CM | POA: Diagnosis not present

## 2018-08-17 DIAGNOSIS — N2581 Secondary hyperparathyroidism of renal origin: Secondary | ICD-10-CM | POA: Diagnosis not present

## 2018-08-17 DIAGNOSIS — I700 Atherosclerosis of aorta: Secondary | ICD-10-CM | POA: Diagnosis not present

## 2018-08-17 DIAGNOSIS — N183 Chronic kidney disease, stage 3 (moderate): Secondary | ICD-10-CM | POA: Diagnosis not present

## 2018-08-17 DIAGNOSIS — Z86718 Personal history of other venous thrombosis and embolism: Secondary | ICD-10-CM | POA: Diagnosis not present

## 2018-08-22 ENCOUNTER — Other Ambulatory Visit: Payer: Self-pay | Admitting: Nephrology

## 2018-08-22 DIAGNOSIS — N183 Chronic kidney disease, stage 3 unspecified: Secondary | ICD-10-CM

## 2018-08-31 ENCOUNTER — Other Ambulatory Visit: Payer: Self-pay

## 2018-08-31 ENCOUNTER — Ambulatory Visit
Admission: RE | Admit: 2018-08-31 | Discharge: 2018-08-31 | Disposition: A | Discharge status: Home / Self Care | Payer: Medicare Other | Source: Ambulatory Visit | Attending: Nephrology | Admitting: Nephrology

## 2018-08-31 DIAGNOSIS — N183 Chronic kidney disease, stage 3 unspecified: Secondary | ICD-10-CM

## 2019-01-03 DIAGNOSIS — M25561 Pain in right knee: Secondary | ICD-10-CM | POA: Diagnosis not present

## 2019-01-03 DIAGNOSIS — M25562 Pain in left knee: Secondary | ICD-10-CM | POA: Diagnosis not present

## 2019-01-11 DIAGNOSIS — E785 Hyperlipidemia, unspecified: Secondary | ICD-10-CM | POA: Diagnosis not present

## 2019-01-11 DIAGNOSIS — I700 Atherosclerosis of aorta: Secondary | ICD-10-CM | POA: Diagnosis not present

## 2019-01-11 DIAGNOSIS — D631 Anemia in chronic kidney disease: Secondary | ICD-10-CM | POA: Diagnosis not present

## 2019-01-11 DIAGNOSIS — Z86718 Personal history of other venous thrombosis and embolism: Secondary | ICD-10-CM | POA: Diagnosis not present

## 2019-01-11 DIAGNOSIS — N189 Chronic kidney disease, unspecified: Secondary | ICD-10-CM | POA: Diagnosis not present

## 2019-01-11 DIAGNOSIS — I129 Hypertensive chronic kidney disease with stage 1 through stage 4 chronic kidney disease, or unspecified chronic kidney disease: Secondary | ICD-10-CM | POA: Diagnosis not present

## 2019-01-11 DIAGNOSIS — N2581 Secondary hyperparathyroidism of renal origin: Secondary | ICD-10-CM | POA: Diagnosis not present

## 2019-01-11 DIAGNOSIS — N183 Chronic kidney disease, stage 3 (moderate): Secondary | ICD-10-CM | POA: Diagnosis not present

## 2019-01-11 DIAGNOSIS — N184 Chronic kidney disease, stage 4 (severe): Secondary | ICD-10-CM | POA: Diagnosis not present

## 2019-01-27 DIAGNOSIS — Z23 Encounter for immunization: Secondary | ICD-10-CM | POA: Diagnosis not present

## 2019-03-06 DIAGNOSIS — D229 Melanocytic nevi, unspecified: Secondary | ICD-10-CM | POA: Diagnosis not present

## 2019-03-06 DIAGNOSIS — I8393 Asymptomatic varicose veins of bilateral lower extremities: Secondary | ICD-10-CM | POA: Diagnosis not present

## 2019-03-06 DIAGNOSIS — L819 Disorder of pigmentation, unspecified: Secondary | ICD-10-CM | POA: Diagnosis not present

## 2019-03-06 DIAGNOSIS — D1801 Hemangioma of skin and subcutaneous tissue: Secondary | ICD-10-CM | POA: Diagnosis not present

## 2019-03-06 DIAGNOSIS — L814 Other melanin hyperpigmentation: Secondary | ICD-10-CM | POA: Diagnosis not present

## 2019-05-03 DIAGNOSIS — Z77090 Contact with and (suspected) exposure to asbestos: Secondary | ICD-10-CM | POA: Diagnosis not present

## 2019-05-03 DIAGNOSIS — I700 Atherosclerosis of aorta: Secondary | ICD-10-CM | POA: Diagnosis not present

## 2019-05-03 DIAGNOSIS — E7800 Pure hypercholesterolemia, unspecified: Secondary | ICD-10-CM | POA: Diagnosis not present

## 2019-05-03 DIAGNOSIS — I10 Essential (primary) hypertension: Secondary | ICD-10-CM | POA: Diagnosis not present

## 2019-05-03 DIAGNOSIS — N183 Chronic kidney disease, stage 3 unspecified: Secondary | ICD-10-CM | POA: Diagnosis not present

## 2019-05-03 DIAGNOSIS — Z0000 Encounter for general adult medical examination without abnormal findings: Secondary | ICD-10-CM | POA: Diagnosis not present

## 2019-05-03 DIAGNOSIS — J3489 Other specified disorders of nose and nasal sinuses: Secondary | ICD-10-CM | POA: Diagnosis not present

## 2019-05-03 DIAGNOSIS — N400 Enlarged prostate without lower urinary tract symptoms: Secondary | ICD-10-CM | POA: Diagnosis not present

## 2019-06-05 DIAGNOSIS — N189 Chronic kidney disease, unspecified: Secondary | ICD-10-CM | POA: Diagnosis not present

## 2019-06-05 DIAGNOSIS — I129 Hypertensive chronic kidney disease with stage 1 through stage 4 chronic kidney disease, or unspecified chronic kidney disease: Secondary | ICD-10-CM | POA: Diagnosis not present

## 2019-06-05 DIAGNOSIS — I700 Atherosclerosis of aorta: Secondary | ICD-10-CM | POA: Diagnosis not present

## 2019-06-05 DIAGNOSIS — E785 Hyperlipidemia, unspecified: Secondary | ICD-10-CM | POA: Diagnosis not present

## 2019-06-05 DIAGNOSIS — N2581 Secondary hyperparathyroidism of renal origin: Secondary | ICD-10-CM | POA: Diagnosis not present

## 2019-06-05 DIAGNOSIS — N184 Chronic kidney disease, stage 4 (severe): Secondary | ICD-10-CM | POA: Diagnosis not present

## 2019-06-05 DIAGNOSIS — Z86718 Personal history of other venous thrombosis and embolism: Secondary | ICD-10-CM | POA: Diagnosis not present

## 2019-06-10 DIAGNOSIS — Z23 Encounter for immunization: Secondary | ICD-10-CM | POA: Diagnosis not present

## 2019-07-04 DIAGNOSIS — M79662 Pain in left lower leg: Secondary | ICD-10-CM | POA: Diagnosis not present

## 2019-07-08 DIAGNOSIS — Z23 Encounter for immunization: Secondary | ICD-10-CM | POA: Diagnosis not present

## 2019-08-01 DIAGNOSIS — S61214A Laceration without foreign body of right ring finger without damage to nail, initial encounter: Secondary | ICD-10-CM | POA: Diagnosis not present

## 2019-08-03 DIAGNOSIS — S61214A Laceration without foreign body of right ring finger without damage to nail, initial encounter: Secondary | ICD-10-CM | POA: Diagnosis not present

## 2019-08-07 DIAGNOSIS — S61214A Laceration without foreign body of right ring finger without damage to nail, initial encounter: Secondary | ICD-10-CM | POA: Diagnosis not present

## 2019-11-02 ENCOUNTER — Other Ambulatory Visit: Payer: Self-pay | Admitting: Family Medicine

## 2019-11-02 DIAGNOSIS — R634 Abnormal weight loss: Secondary | ICD-10-CM

## 2019-11-08 ENCOUNTER — Ambulatory Visit
Admission: RE | Admit: 2019-11-08 | Discharge: 2019-11-08 | Disposition: A | Discharge status: Home / Self Care | Payer: Medicare Other | Source: Ambulatory Visit | Attending: Family Medicine | Admitting: Family Medicine

## 2019-11-08 DIAGNOSIS — R634 Abnormal weight loss: Secondary | ICD-10-CM

## 2019-11-08 DIAGNOSIS — N200 Calculus of kidney: Secondary | ICD-10-CM | POA: Diagnosis not present

## 2019-11-08 DIAGNOSIS — N281 Cyst of kidney, acquired: Secondary | ICD-10-CM | POA: Diagnosis not present

## 2019-11-08 DIAGNOSIS — R0602 Shortness of breath: Secondary | ICD-10-CM | POA: Diagnosis not present

## 2019-11-08 DIAGNOSIS — K828 Other specified diseases of gallbladder: Secondary | ICD-10-CM | POA: Diagnosis not present

## 2019-11-08 DIAGNOSIS — N189 Chronic kidney disease, unspecified: Secondary | ICD-10-CM | POA: Diagnosis not present

## 2019-11-08 DIAGNOSIS — I700 Atherosclerosis of aorta: Secondary | ICD-10-CM | POA: Diagnosis not present

## 2019-11-20 DIAGNOSIS — M549 Dorsalgia, unspecified: Secondary | ICD-10-CM | POA: Diagnosis not present

## 2019-11-20 DIAGNOSIS — M545 Low back pain: Secondary | ICD-10-CM | POA: Diagnosis not present

## 2019-11-28 DIAGNOSIS — M549 Dorsalgia, unspecified: Secondary | ICD-10-CM | POA: Diagnosis not present

## 2019-12-27 DIAGNOSIS — Z23 Encounter for immunization: Secondary | ICD-10-CM | POA: Diagnosis not present

## 2019-12-27 DIAGNOSIS — R52 Pain, unspecified: Secondary | ICD-10-CM | POA: Diagnosis not present

## 2019-12-27 DIAGNOSIS — M545 Low back pain: Secondary | ICD-10-CM | POA: Diagnosis not present

## 2020-01-24 DIAGNOSIS — Z86718 Personal history of other venous thrombosis and embolism: Secondary | ICD-10-CM | POA: Diagnosis not present

## 2020-01-24 DIAGNOSIS — N189 Chronic kidney disease, unspecified: Secondary | ICD-10-CM | POA: Diagnosis not present

## 2020-01-24 DIAGNOSIS — E785 Hyperlipidemia, unspecified: Secondary | ICD-10-CM | POA: Diagnosis not present

## 2020-01-24 DIAGNOSIS — N184 Chronic kidney disease, stage 4 (severe): Secondary | ICD-10-CM | POA: Diagnosis not present

## 2020-01-24 DIAGNOSIS — N2581 Secondary hyperparathyroidism of renal origin: Secondary | ICD-10-CM | POA: Diagnosis not present

## 2020-01-24 DIAGNOSIS — I700 Atherosclerosis of aorta: Secondary | ICD-10-CM | POA: Diagnosis not present

## 2020-01-24 DIAGNOSIS — I129 Hypertensive chronic kidney disease with stage 1 through stage 4 chronic kidney disease, or unspecified chronic kidney disease: Secondary | ICD-10-CM | POA: Diagnosis not present

## 2020-01-31 DIAGNOSIS — E039 Hypothyroidism, unspecified: Secondary | ICD-10-CM | POA: Diagnosis not present

## 2020-01-31 DIAGNOSIS — R634 Abnormal weight loss: Secondary | ICD-10-CM | POA: Diagnosis not present

## 2020-03-05 DIAGNOSIS — L814 Other melanin hyperpigmentation: Secondary | ICD-10-CM | POA: Diagnosis not present

## 2020-03-05 DIAGNOSIS — D229 Melanocytic nevi, unspecified: Secondary | ICD-10-CM | POA: Diagnosis not present

## 2020-03-05 DIAGNOSIS — L819 Disorder of pigmentation, unspecified: Secondary | ICD-10-CM | POA: Diagnosis not present

## 2020-03-05 DIAGNOSIS — I8393 Asymptomatic varicose veins of bilateral lower extremities: Secondary | ICD-10-CM | POA: Diagnosis not present

## 2020-03-05 DIAGNOSIS — L570 Actinic keratosis: Secondary | ICD-10-CM | POA: Diagnosis not present

## 2020-03-05 DIAGNOSIS — D1801 Hemangioma of skin and subcutaneous tissue: Secondary | ICD-10-CM | POA: Diagnosis not present

## 2020-04-01 DIAGNOSIS — M71571 Other bursitis, not elsewhere classified, right ankle and foot: Secondary | ICD-10-CM | POA: Diagnosis not present

## 2020-04-01 DIAGNOSIS — M216X1 Other acquired deformities of right foot: Secondary | ICD-10-CM | POA: Diagnosis not present

## 2020-05-29 DIAGNOSIS — Z23 Encounter for immunization: Secondary | ICD-10-CM | POA: Diagnosis not present

## 2020-06-13 DIAGNOSIS — Z96652 Presence of left artificial knee joint: Secondary | ICD-10-CM | POA: Diagnosis not present

## 2020-06-13 DIAGNOSIS — Z96651 Presence of right artificial knee joint: Secondary | ICD-10-CM | POA: Diagnosis not present

## 2020-06-13 DIAGNOSIS — Z471 Aftercare following joint replacement surgery: Secondary | ICD-10-CM | POA: Diagnosis not present

## 2020-07-09 DIAGNOSIS — M545 Low back pain, unspecified: Secondary | ICD-10-CM | POA: Diagnosis not present

## 2020-07-09 DIAGNOSIS — M47816 Spondylosis without myelopathy or radiculopathy, lumbar region: Secondary | ICD-10-CM | POA: Diagnosis not present

## 2020-08-01 ENCOUNTER — Other Ambulatory Visit: Payer: Self-pay

## 2020-08-01 ENCOUNTER — Emergency Department (HOSPITAL_COMMUNITY): Payer: Medicare Other

## 2020-08-01 ENCOUNTER — Encounter (HOSPITAL_COMMUNITY): Payer: Self-pay

## 2020-08-01 ENCOUNTER — Emergency Department (HOSPITAL_COMMUNITY)
Admission: EM | Admit: 2020-08-01 | Discharge: 2020-08-02 | Disposition: A | Discharge status: Home / Self Care | Payer: Medicare Other | Attending: Emergency Medicine | Admitting: Emergency Medicine

## 2020-08-01 DIAGNOSIS — M549 Dorsalgia, unspecified: Secondary | ICD-10-CM | POA: Diagnosis not present

## 2020-08-01 DIAGNOSIS — M545 Low back pain, unspecified: Secondary | ICD-10-CM | POA: Diagnosis not present

## 2020-08-01 DIAGNOSIS — R220 Localized swelling, mass and lump, head: Secondary | ICD-10-CM | POA: Diagnosis not present

## 2020-08-01 DIAGNOSIS — K5730 Diverticulosis of large intestine without perforation or abscess without bleeding: Secondary | ICD-10-CM | POA: Insufficient documentation

## 2020-08-01 DIAGNOSIS — Z7901 Long term (current) use of anticoagulants: Secondary | ICD-10-CM | POA: Insufficient documentation

## 2020-08-01 DIAGNOSIS — Z87891 Personal history of nicotine dependence: Secondary | ICD-10-CM | POA: Insufficient documentation

## 2020-08-01 DIAGNOSIS — W19XXXA Unspecified fall, initial encounter: Secondary | ICD-10-CM | POA: Diagnosis not present

## 2020-08-01 DIAGNOSIS — Z86718 Personal history of other venous thrombosis and embolism: Secondary | ICD-10-CM | POA: Insufficient documentation

## 2020-08-01 DIAGNOSIS — K8080 Other cholelithiasis without obstruction: Secondary | ICD-10-CM | POA: Diagnosis not present

## 2020-08-01 DIAGNOSIS — S3993XA Unspecified injury of pelvis, initial encounter: Secondary | ICD-10-CM | POA: Diagnosis not present

## 2020-08-01 DIAGNOSIS — K8020 Calculus of gallbladder without cholecystitis without obstruction: Secondary | ICD-10-CM | POA: Diagnosis not present

## 2020-08-01 DIAGNOSIS — I700 Atherosclerosis of aorta: Secondary | ICD-10-CM | POA: Diagnosis not present

## 2020-08-01 DIAGNOSIS — I739 Peripheral vascular disease, unspecified: Secondary | ICD-10-CM | POA: Diagnosis not present

## 2020-08-01 DIAGNOSIS — M47816 Spondylosis without myelopathy or radiculopathy, lumbar region: Secondary | ICD-10-CM | POA: Insufficient documentation

## 2020-08-01 DIAGNOSIS — Z79899 Other long term (current) drug therapy: Secondary | ICD-10-CM | POA: Insufficient documentation

## 2020-08-01 DIAGNOSIS — Z96653 Presence of artificial knee joint, bilateral: Secondary | ICD-10-CM | POA: Insufficient documentation

## 2020-08-01 DIAGNOSIS — M160 Bilateral primary osteoarthritis of hip: Secondary | ICD-10-CM | POA: Diagnosis not present

## 2020-08-01 DIAGNOSIS — M4319 Spondylolisthesis, multiple sites in spine: Secondary | ICD-10-CM | POA: Diagnosis not present

## 2020-08-01 DIAGNOSIS — G319 Degenerative disease of nervous system, unspecified: Secondary | ICD-10-CM | POA: Diagnosis not present

## 2020-08-01 DIAGNOSIS — Z20822 Contact with and (suspected) exposure to covid-19: Secondary | ICD-10-CM | POA: Insufficient documentation

## 2020-08-01 DIAGNOSIS — Z043 Encounter for examination and observation following other accident: Secondary | ICD-10-CM | POA: Diagnosis not present

## 2020-08-01 DIAGNOSIS — I2510 Atherosclerotic heart disease of native coronary artery without angina pectoris: Secondary | ICD-10-CM | POA: Diagnosis not present

## 2020-08-01 DIAGNOSIS — I10 Essential (primary) hypertension: Secondary | ICD-10-CM | POA: Insufficient documentation

## 2020-08-01 DIAGNOSIS — N200 Calculus of kidney: Secondary | ICD-10-CM | POA: Insufficient documentation

## 2020-08-01 DIAGNOSIS — Y9301 Activity, walking, marching and hiking: Secondary | ICD-10-CM | POA: Insufficient documentation

## 2020-08-01 DIAGNOSIS — J929 Pleural plaque without asbestos: Secondary | ICD-10-CM | POA: Diagnosis not present

## 2020-08-01 DIAGNOSIS — S022XXA Fracture of nasal bones, initial encounter for closed fracture: Secondary | ICD-10-CM | POA: Insufficient documentation

## 2020-08-01 DIAGNOSIS — Y92015 Private garage of single-family (private) house as the place of occurrence of the external cause: Secondary | ICD-10-CM | POA: Insufficient documentation

## 2020-08-01 DIAGNOSIS — J320 Chronic maxillary sinusitis: Secondary | ICD-10-CM | POA: Diagnosis not present

## 2020-08-01 DIAGNOSIS — W01190A Fall on same level from slipping, tripping and stumbling with subsequent striking against furniture, initial encounter: Secondary | ICD-10-CM | POA: Diagnosis not present

## 2020-08-01 DIAGNOSIS — M7989 Other specified soft tissue disorders: Secondary | ICD-10-CM | POA: Diagnosis not present

## 2020-08-01 DIAGNOSIS — S199XXA Unspecified injury of neck, initial encounter: Secondary | ICD-10-CM | POA: Diagnosis not present

## 2020-08-01 DIAGNOSIS — K5750 Diverticulosis of both small and large intestine without perforation or abscess without bleeding: Secondary | ICD-10-CM | POA: Diagnosis not present

## 2020-08-01 DIAGNOSIS — J3489 Other specified disorders of nose and nasal sinuses: Secondary | ICD-10-CM | POA: Diagnosis not present

## 2020-08-01 LAB — COMPREHENSIVE METABOLIC PANEL
ALT: 19 U/L (ref 0–44)
AST: 45 U/L — ABNORMAL HIGH (ref 15–41)
Albumin: 3.3 g/dL — ABNORMAL LOW (ref 3.5–5.0)
Alkaline Phosphatase: 89 U/L (ref 38–126)
Anion gap: 7 (ref 5–15)
BUN: 28 mg/dL — ABNORMAL HIGH (ref 8–23)
CO2: 24 mmol/L (ref 22–32)
Calcium: 9.3 mg/dL (ref 8.9–10.3)
Chloride: 110 mmol/L (ref 98–111)
Creatinine, Ser: 1.47 mg/dL — ABNORMAL HIGH (ref 0.61–1.24)
GFR, Estimated: 46 mL/min — ABNORMAL LOW (ref >60)
Glucose, Bld: 99 mg/dL (ref 70–99)
Potassium: 4.2 mmol/L (ref 3.5–5.1)
Sodium: 141 mmol/L (ref 135–145)
Total Bilirubin: 0.8 mg/dL (ref 0.3–1.2)
Total Protein: 5.7 g/dL — ABNORMAL LOW (ref 6.5–8.1)

## 2020-08-01 LAB — CBC
HCT: 42.6 % (ref 39.0–52.0)
Hemoglobin: 14 g/dL (ref 13.0–17.0)
MCH: 32.3 pg (ref 26.0–34.0)
MCHC: 32.9 g/dL (ref 30.0–36.0)
MCV: 98.2 fL (ref 80.0–100.0)
Platelets: 170 10*3/uL (ref 150–400)
RBC: 4.34 MIL/uL (ref 4.22–5.81)
RDW: 13.8 % (ref 11.5–15.5)
WBC: 5.7 10*3/uL (ref 4.0–10.5)
nRBC: 0 % (ref 0.0–0.2)

## 2020-08-01 LAB — URINALYSIS, ROUTINE W REFLEX MICROSCOPIC
Bilirubin Urine: NEGATIVE
Glucose, UA: NEGATIVE mg/dL
Hgb urine dipstick: NEGATIVE
Ketones, ur: NEGATIVE mg/dL
Leukocytes,Ua: NEGATIVE
Nitrite: NEGATIVE
Protein, ur: NEGATIVE mg/dL
Specific Gravity, Urine: 1.014 (ref 1.005–1.030)
pH: 5 (ref 5.0–8.0)

## 2020-08-01 LAB — SAMPLE TO BLOOD BANK

## 2020-08-01 LAB — LACTIC ACID, PLASMA: Lactic Acid, Venous: 1.2 mmol/L (ref 0.5–1.9)

## 2020-08-01 LAB — I-STAT CHEM 8, ED
BUN: 29 mg/dL — ABNORMAL HIGH (ref 8–23)
Calcium, Ion: 1.13 mmol/L — ABNORMAL LOW (ref 1.15–1.40)
Chloride: 110 mmol/L (ref 98–111)
Creatinine, Ser: 1.4 mg/dL — ABNORMAL HIGH (ref 0.61–1.24)
Glucose, Bld: 95 mg/dL (ref 70–99)
HCT: 40 % (ref 39.0–52.0)
Hemoglobin: 13.6 g/dL (ref 13.0–17.0)
Potassium: 3.9 mmol/L (ref 3.5–5.1)
Sodium: 143 mmol/L (ref 135–145)
TCO2: 21 mmol/L — ABNORMAL LOW (ref 22–32)

## 2020-08-01 LAB — RESP PANEL BY RT-PCR (FLU A&B, COVID) ARPGX2
Influenza A by PCR: NEGATIVE
Influenza B by PCR: NEGATIVE
SARS Coronavirus 2 by RT PCR: NEGATIVE

## 2020-08-01 LAB — PROTIME-INR
INR: 1.2 (ref 0.8–1.2)
Prothrombin Time: 14.4 seconds (ref 11.4–15.2)

## 2020-08-01 LAB — ETHANOL: Alcohol, Ethyl (B): <10 mg/dL (ref <10)

## 2020-08-01 MED ORDER — IOHEXOL 300 MG/ML  SOLN
70.0000 mL | Freq: Once | INTRAMUSCULAR | Status: AC | PRN
  Administered 2020-08-01: 70 mL via INTRAVENOUS

## 2020-08-01 NOTE — ED Provider Notes (Signed)
[place] EMERGENCY DEPARTMENT Provider Note   CSN: [id number] Arrival date & time: 08/01/20  1816     History Chief Complaint  Patient presents with  . Fall    Steven Horton is a 87 y.o. male.  HPI Patient was walking in his garage tonight when he tripped over something and fell into a table. Patient with significant discomfort over his nose but otherwise without complaint. Discussed that given his history of AC usage patient would have to be elevated to LVL 2 evalutation. Patient denies LOC, fevers or chills, nausea or vomitting, syncope or SOB. Patient otherwise healthy and UTD on his medications.  Past Medical History:  Diagnosis Date  . Arthritis   . DVT (deep venous thrombosis) ([place])   . HOH (hard of hearing)   . Hypertension   . Stomach ulcer     Patient Active Problem List   Diagnosis Date Noted  . Primary osteoarthritis of left knee 01/15/2017  . Failed total knee, left ([place]) 01/14/2017  . Phlebitis and thrombophlebitis of other deep vessels of lower extremities 02/10/2013  . Leg swelling 02/10/2013  . Pain in joint, lower leg 02/10/2013  . DVT (deep venous thrombosis) ([place]) 02/10/2013  . Baker's cyst, ruptured 02/10/2013    Past Surgical History:  Procedure Laterality Date  . JOINT REPLACEMENT    . lumps removed     right arm, back,   . REPLACEMENT TOTAL KNEE BILATERAL    . SHOULDER SURGERY     multiple right  . TOTAL KNEE REVISION Left 01/15/2017   Procedure: TOTAL KNEE REVISION;  Surgeon: Frederik Pear, MD;  Location: [facility];  Service: Orthopedics;  Laterality: Left;       Family History  Problem Relation Age of Onset  . Cancer Sister     Social History   Tobacco Use  . Smoking status: Former Smoker    Types: Cigarettes    Quit date: 04/27/1978    Years since quitting: 42.2  . Smokeless tobacco: Never Used  Vaping Use  . Vaping Use: Never used  Substance Use Topics  . Alcohol use: Yes    Alcohol/week: 4.0 standard  drinks    Types: 4 Shots of liquor per week  . Drug use: No    Home Medications Prior to Admission medications   Medication Sig Start Date End Date Taking? Authorizing Provider  apixaban (ELIQUIS) 2.5 MG TABS tablet Take 1 tablet (2.5 mg total) by mouth 2 (two) times daily. 01/18/17   Leighton Parody, PA-C  docusate sodium (COLACE) 100 MG capsule Take 100 mg by mouth every evening.    [provider]  Emollient (DERMEND BRUISE FORMULA EX) Apply 1 application topically as needed (bruising).    [provider]  fenofibrate 160 MG tablet Take 160 mg by mouth daily.      [provider]  HYDROcodone-acetaminophen (NORCO/VICODIN) 5-325 MG tablet Take 1 tablet by mouth every 4 (four) hours as needed. 01/15/17   Leighton Parody, PA-C  Multiple Vitamins-Minerals (CENTRUM SILVER 50+MEN PO) Take 1 tablet by mouth daily.    [provider]  Multiple Vitamins-Minerals (MULTIVITAMINS THER. W/MINERALS) TABS Take 1 tablet by mouth daily.      [provider]  tamsulosin (FLOMAX) 0.4 MG CAPS capsule Take 0.4 mg by mouth every evening.  12/14/16   [provider]  tiZANidine (ZANAFLEX) 2 MG tablet Take 1 tablet (2 mg total) by mouth every 6 (six) hours as needed for muscle spasms. 01/15/17  Joanell Rising K, PA-C  traMADol (ULTRAM) 50 MG tablet Take 50 mg by mouth 2 (two) times daily. Maximum dose= 8 tablets per day.  For pain.     [provider]  verapamil (CALAN-SR) 240 MG CR tablet Take 240 mg by mouth 2 (two) times daily.  01/02/13   [provider]  vitamin C (ASCORBIC ACID) 500 MG tablet Take 500 mg by mouth daily.    [provider]    Allergies    Patient has no known allergies.  Review of Systems   Review of Systems  Constitutional: Negative for chills and fever.  HENT: Negative for ear pain and sore throat.   Eyes: Negative for pain and visual disturbance.  Respiratory: Negative for cough and shortness of breath.    Cardiovascular: Negative for chest pain and palpitations.  Gastrointestinal: Negative for abdominal pain and vomiting.  Genitourinary: Negative for dysuria and hematuria.  Musculoskeletal: Negative for arthralgias and back pain.  Skin: Negative for color change and rash.  Neurological: Negative for seizures and syncope.  All other systems reviewed and are negative.   Physical Exam Updated Vital Signs BP (!) 196/79 (BP Location: Right Arm)   Pulse 80   Temp 97.8 F (36.6 C) (Oral)   Resp (!) 24   SpO2 100%   Physical Exam Vitals and nursing note reviewed.  Constitutional:      Appearance: He is well-developed.  HENT:     Head: Normocephalic and atraumatic.  Eyes:     Conjunctiva/sclera: Conjunctivae normal.  Cardiovascular:     Rate and Rhythm: Normal rate and regular rhythm.     Heart sounds: No murmur heard.   Pulmonary:     Effort: Pulmonary effort is normal. No respiratory distress.     Breath sounds: Normal breath sounds.  Abdominal:     Palpations: Abdomen is soft.     Tenderness: There is no abdominal tenderness.  Musculoskeletal:        General: Swelling (Obvious swelling and deformity over the mid nose) and deformity present.     Cervical back: Neck supple.  Skin:    General: Skin is warm and dry.  Neurological:     Mental Status: He is alert.    ED Course  I have reviewed the triage vital signs and the nursing notes.  Pertinent labs & imaging results that were available during my care of the patient were reviewed by me and considered in my medical decision making (see chart for details).    MDM Rules/Calculators/A&P                          Patient's HPI and PE findings warrant elevation to level 2 trauma. Patient's primary survery with intact ABCs and patient deemed stable to transfer to CT scanner. CT scans completed per trauma protocol and no acute abnormalities were detected. Discussed findings of septum fracture, nose blowing precautions, and  follow up with ENT with the patient who expressed understanding. Patient able to ambulate without difficulty and deemed stable to discharge to continue OP management at this time. Strict return precautions including those related to delayed ICH reinforced to the patient who expressed understanding.  Disposition: Based on the above findings, I believe patient is stable for discharge.   Patient/family educated about specific return precautions for given chief complaint and symptoms.  Patient/family educated about follow-up with PCP and ENT.  Patient/family expressed understanding of return precautions and need for follow-up. Patient  spoken to regarding all imaging and laboratory results and appropriate follow up for these results. All education provided in verbal form with additional information in written form. Time was allowed for answering of patient questions. Patient discharged.   Emergency Department Medication Summary: Medications  iohexol (OMNIPAQUE) 300 MG/ML solution 70 mL (70 mLs Intravenous Contrast Given 08/01/20 2132)    Final Clinical Impression(s) / ED Diagnoses Final diagnoses:  None    Rx / DC Orders ED Discharge Orders    None       Tretha Sciara, MD 08/02/20 0127    Varney Biles, MD 08/03/20 1751

## 2020-08-01 NOTE — ED Notes (Signed)
[facility] [phone] would like an update when possible

## 2020-08-01 NOTE — ED Notes (Signed)
Manual pressure taken when pt was leveled at 20:10. Pressure was 212/98

## 2020-08-01 NOTE — ED Triage Notes (Signed)
Pt BIB [facility] d/t falling while walking out of his garage without his walker he tripped. Pt arrived to ED A/Ox4, verbal- able to make needs know, C-Collar in place, hematoma to his forehead, nose swelling, packed with gauze d/t initial bleeding on scene. Pt endorses continues lower back pain that worsened after his fall. No thinners.

## 2020-08-02 NOTE — ED Notes (Signed)
Pt wheeled to ED lobby for cab ride to personal residence and assisted in vehicle. Neighbor, Tom O'Laughlin notified and stated he will have pt's cane for mobility and key ready upon arrival to residence. Pt AO4. Respirations even and unlabored at room air with no acute distress noted. Bag with clothing and cell phone given to pt prior to ride to lobby.

## 2020-08-02 NOTE — ED Notes (Signed)
Trial ambulation completed per MD verbal order. Pt able to ambulate with strong and steady gait with use of walking assistive device. Pt placed back in bed after trial ambulation and is resting comfortably.

## 2020-08-06 DIAGNOSIS — N184 Chronic kidney disease, stage 4 (severe): Secondary | ICD-10-CM | POA: Diagnosis not present

## 2020-08-06 DIAGNOSIS — E039 Hypothyroidism, unspecified: Secondary | ICD-10-CM | POA: Diagnosis not present

## 2020-08-06 DIAGNOSIS — E7800 Pure hypercholesterolemia, unspecified: Secondary | ICD-10-CM | POA: Diagnosis not present

## 2020-08-06 DIAGNOSIS — I10 Essential (primary) hypertension: Secondary | ICD-10-CM | POA: Diagnosis not present

## 2020-08-06 DIAGNOSIS — N400 Enlarged prostate without lower urinary tract symptoms: Secondary | ICD-10-CM | POA: Diagnosis not present

## 2020-08-20 DIAGNOSIS — Z09 Encounter for follow-up examination after completed treatment for conditions other than malignant neoplasm: Secondary | ICD-10-CM | POA: Diagnosis not present

## 2020-08-20 DIAGNOSIS — S022XXS Fracture of nasal bones, sequela: Secondary | ICD-10-CM | POA: Diagnosis not present

## 2020-08-20 DIAGNOSIS — R0781 Pleurodynia: Secondary | ICD-10-CM | POA: Diagnosis not present

## 2020-09-04 DIAGNOSIS — N184 Chronic kidney disease, stage 4 (severe): Secondary | ICD-10-CM | POA: Diagnosis not present

## 2020-09-04 DIAGNOSIS — I700 Atherosclerosis of aorta: Secondary | ICD-10-CM | POA: Diagnosis not present

## 2020-09-04 DIAGNOSIS — I129 Hypertensive chronic kidney disease with stage 1 through stage 4 chronic kidney disease, or unspecified chronic kidney disease: Secondary | ICD-10-CM | POA: Diagnosis not present

## 2020-09-04 DIAGNOSIS — N2581 Secondary hyperparathyroidism of renal origin: Secondary | ICD-10-CM | POA: Diagnosis not present

## 2020-09-04 DIAGNOSIS — Z86718 Personal history of other venous thrombosis and embolism: Secondary | ICD-10-CM | POA: Diagnosis not present

## 2020-09-04 DIAGNOSIS — E785 Hyperlipidemia, unspecified: Secondary | ICD-10-CM | POA: Diagnosis not present

## 2020-09-10 DIAGNOSIS — M47816 Spondylosis without myelopathy or radiculopathy, lumbar region: Secondary | ICD-10-CM | POA: Diagnosis not present

## 2020-09-30 DIAGNOSIS — I10 Essential (primary) hypertension: Secondary | ICD-10-CM | POA: Diagnosis not present

## 2020-09-30 DIAGNOSIS — E039 Hypothyroidism, unspecified: Secondary | ICD-10-CM | POA: Diagnosis not present

## 2020-09-30 DIAGNOSIS — N400 Enlarged prostate without lower urinary tract symptoms: Secondary | ICD-10-CM | POA: Diagnosis not present

## 2020-09-30 DIAGNOSIS — E7800 Pure hypercholesterolemia, unspecified: Secondary | ICD-10-CM | POA: Diagnosis not present

## 2020-09-30 DIAGNOSIS — N184 Chronic kidney disease, stage 4 (severe): Secondary | ICD-10-CM | POA: Diagnosis not present

## 2020-11-06 DIAGNOSIS — M549 Dorsalgia, unspecified: Secondary | ICD-10-CM | POA: Diagnosis not present

## 2020-11-06 DIAGNOSIS — M79674 Pain in right toe(s): Secondary | ICD-10-CM | POA: Diagnosis not present

## 2020-11-28 DIAGNOSIS — E7800 Pure hypercholesterolemia, unspecified: Secondary | ICD-10-CM | POA: Diagnosis not present

## 2020-11-28 DIAGNOSIS — E039 Hypothyroidism, unspecified: Secondary | ICD-10-CM | POA: Diagnosis not present

## 2020-11-28 DIAGNOSIS — I10 Essential (primary) hypertension: Secondary | ICD-10-CM | POA: Diagnosis not present

## 2020-11-28 DIAGNOSIS — N400 Enlarged prostate without lower urinary tract symptoms: Secondary | ICD-10-CM | POA: Diagnosis not present

## 2020-11-28 DIAGNOSIS — N184 Chronic kidney disease, stage 4 (severe): Secondary | ICD-10-CM | POA: Diagnosis not present

## 2021-01-27 DIAGNOSIS — C4442 Squamous cell carcinoma of skin of scalp and neck: Secondary | ICD-10-CM | POA: Diagnosis not present

## 2021-01-27 DIAGNOSIS — D492 Neoplasm of unspecified behavior of bone, soft tissue, and skin: Secondary | ICD-10-CM | POA: Diagnosis not present

## 2021-02-05 DIAGNOSIS — I10 Essential (primary) hypertension: Secondary | ICD-10-CM | POA: Diagnosis not present

## 2021-02-05 DIAGNOSIS — N184 Chronic kidney disease, stage 4 (severe): Secondary | ICD-10-CM | POA: Diagnosis not present

## 2021-02-05 DIAGNOSIS — E7800 Pure hypercholesterolemia, unspecified: Secondary | ICD-10-CM | POA: Diagnosis not present

## 2021-02-05 DIAGNOSIS — N400 Enlarged prostate without lower urinary tract symptoms: Secondary | ICD-10-CM | POA: Diagnosis not present

## 2021-02-05 DIAGNOSIS — E039 Hypothyroidism, unspecified: Secondary | ICD-10-CM | POA: Diagnosis not present

## 2021-02-06 DIAGNOSIS — Z23 Encounter for immunization: Secondary | ICD-10-CM | POA: Diagnosis not present

## 2021-02-18 DIAGNOSIS — D044 Carcinoma in situ of skin of scalp and neck: Secondary | ICD-10-CM | POA: Diagnosis not present

## 2021-02-27 DIAGNOSIS — N189 Chronic kidney disease, unspecified: Secondary | ICD-10-CM | POA: Diagnosis not present

## 2021-02-27 DIAGNOSIS — I129 Hypertensive chronic kidney disease with stage 1 through stage 4 chronic kidney disease, or unspecified chronic kidney disease: Secondary | ICD-10-CM | POA: Diagnosis not present

## 2021-02-27 DIAGNOSIS — I700 Atherosclerosis of aorta: Secondary | ICD-10-CM | POA: Diagnosis not present

## 2021-02-27 DIAGNOSIS — N184 Chronic kidney disease, stage 4 (severe): Secondary | ICD-10-CM | POA: Diagnosis not present

## 2021-02-27 DIAGNOSIS — N2581 Secondary hyperparathyroidism of renal origin: Secondary | ICD-10-CM | POA: Diagnosis not present

## 2021-02-27 DIAGNOSIS — E785 Hyperlipidemia, unspecified: Secondary | ICD-10-CM | POA: Diagnosis not present

## 2021-02-27 DIAGNOSIS — Z86718 Personal history of other venous thrombosis and embolism: Secondary | ICD-10-CM | POA: Diagnosis not present

## 2021-05-22 DIAGNOSIS — L814 Other melanin hyperpigmentation: Secondary | ICD-10-CM | POA: Diagnosis not present

## 2021-05-22 DIAGNOSIS — Z08 Encounter for follow-up examination after completed treatment for malignant neoplasm: Secondary | ICD-10-CM | POA: Diagnosis not present

## 2021-05-22 DIAGNOSIS — I8393 Asymptomatic varicose veins of bilateral lower extremities: Secondary | ICD-10-CM | POA: Diagnosis not present

## 2021-05-22 DIAGNOSIS — L821 Other seborrheic keratosis: Secondary | ICD-10-CM | POA: Diagnosis not present

## 2021-05-22 DIAGNOSIS — D225 Melanocytic nevi of trunk: Secondary | ICD-10-CM | POA: Diagnosis not present

## 2021-05-22 DIAGNOSIS — Z85828 Personal history of other malignant neoplasm of skin: Secondary | ICD-10-CM | POA: Diagnosis not present

## 2021-05-22 DIAGNOSIS — L819 Disorder of pigmentation, unspecified: Secondary | ICD-10-CM | POA: Diagnosis not present

## 2021-05-22 DIAGNOSIS — L578 Other skin changes due to chronic exposure to nonionizing radiation: Secondary | ICD-10-CM | POA: Diagnosis not present

## 2021-05-22 DIAGNOSIS — L570 Actinic keratosis: Secondary | ICD-10-CM | POA: Diagnosis not present

## 2021-05-23 DIAGNOSIS — E7800 Pure hypercholesterolemia, unspecified: Secondary | ICD-10-CM | POA: Diagnosis not present

## 2021-05-23 DIAGNOSIS — E039 Hypothyroidism, unspecified: Secondary | ICD-10-CM | POA: Diagnosis not present

## 2021-05-28 DIAGNOSIS — Z0000 Encounter for general adult medical examination without abnormal findings: Secondary | ICD-10-CM | POA: Diagnosis not present

## 2021-05-28 DIAGNOSIS — M549 Dorsalgia, unspecified: Secondary | ICD-10-CM | POA: Diagnosis not present

## 2021-05-28 DIAGNOSIS — H6120 Impacted cerumen, unspecified ear: Secondary | ICD-10-CM | POA: Diagnosis not present

## 2021-05-28 DIAGNOSIS — N400 Enlarged prostate without lower urinary tract symptoms: Secondary | ICD-10-CM | POA: Diagnosis not present

## 2021-05-28 DIAGNOSIS — E7800 Pure hypercholesterolemia, unspecified: Secondary | ICD-10-CM | POA: Diagnosis not present

## 2021-05-28 DIAGNOSIS — I700 Atherosclerosis of aorta: Secondary | ICD-10-CM | POA: Diagnosis not present

## 2021-05-28 DIAGNOSIS — N184 Chronic kidney disease, stage 4 (severe): Secondary | ICD-10-CM | POA: Diagnosis not present

## 2021-05-28 DIAGNOSIS — I10 Essential (primary) hypertension: Secondary | ICD-10-CM | POA: Diagnosis not present

## 2021-05-28 DIAGNOSIS — E039 Hypothyroidism, unspecified: Secondary | ICD-10-CM | POA: Diagnosis not present

## 2021-07-03 DIAGNOSIS — N189 Chronic kidney disease, unspecified: Secondary | ICD-10-CM | POA: Diagnosis not present

## 2021-07-03 DIAGNOSIS — I10 Essential (primary) hypertension: Secondary | ICD-10-CM | POA: Diagnosis not present

## 2021-07-29 IMAGING — CT CT L SPINE W/O CM
3 of 4 series · 12 of 33 positions shown, 14 images · IV contrast (agent unspecified)
Comparison: Lumbar spine MRI 01/20/2013

CLINICAL DATA: Fall

EXAM:
CT Thoracic and Lumbar spine without contrast
TECHNIQUE: Multiplanar CT images of the thoracic and lumbar spine were
reconstructed from contemporary CT of the Chest, Abdomen, and Pelvis
CONTRAST:  No additional

[Series 6: cap with · axial · 0.30mm/px · z∈[-686,-502]mm · 4 of 245 slices shown, 5 images (1 of 3)]
[im 31/245  soft-tissue]
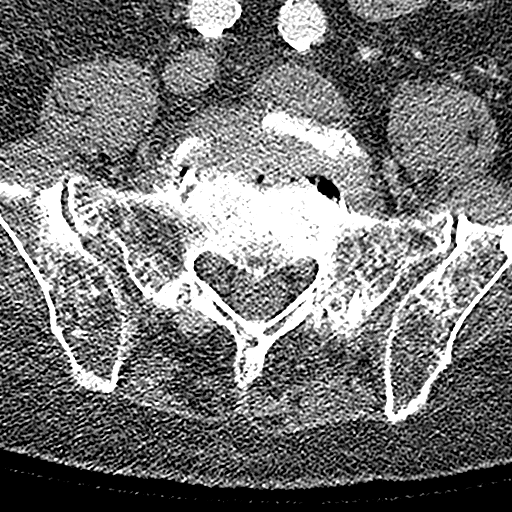
[im 31/245  bone]
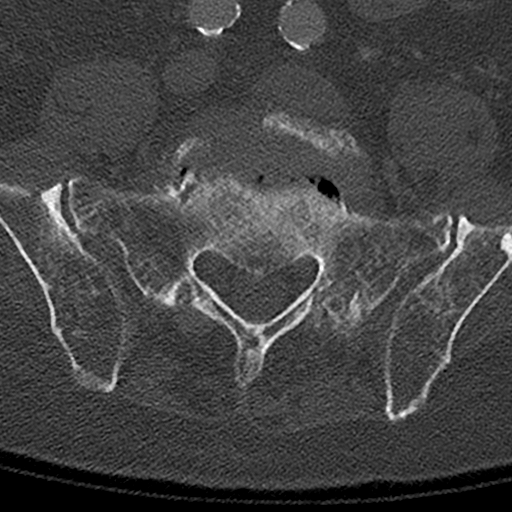
[im 92/245  bone]
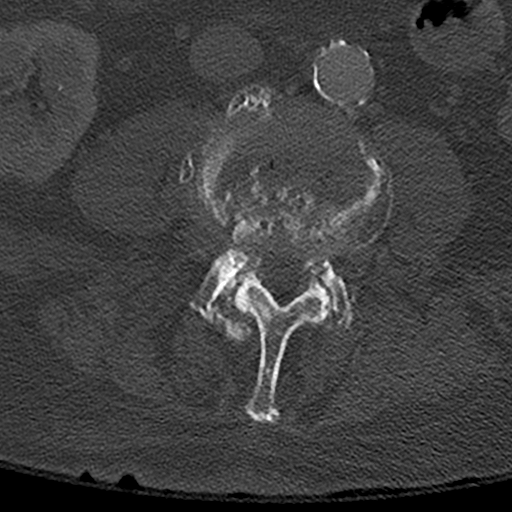
[im 153/245  bone]
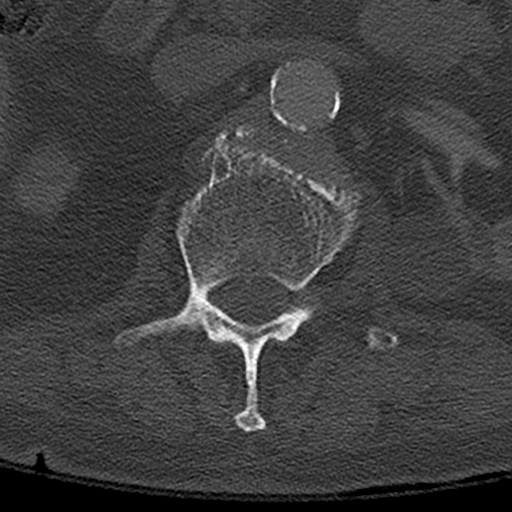
[im 214/245  bone]
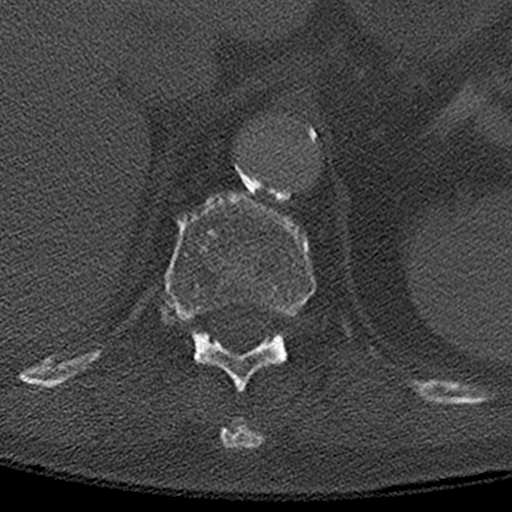

[Series 7: cap with · coronal · 0.33mm/px · 3 of 160 slices shown (2 of 3)]
[im 32/160  bone]
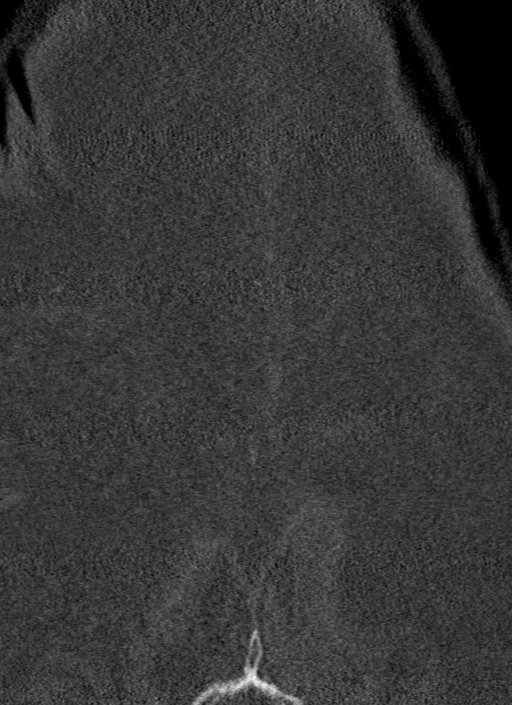
[im 64/160  bone]
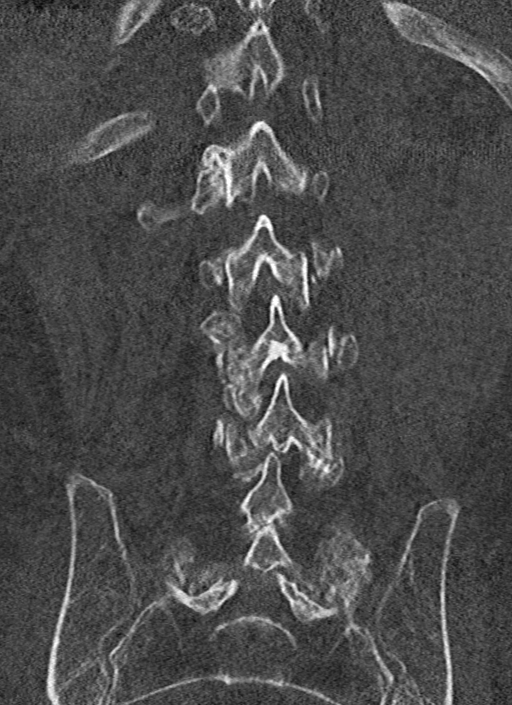
[im 96/160  bone]
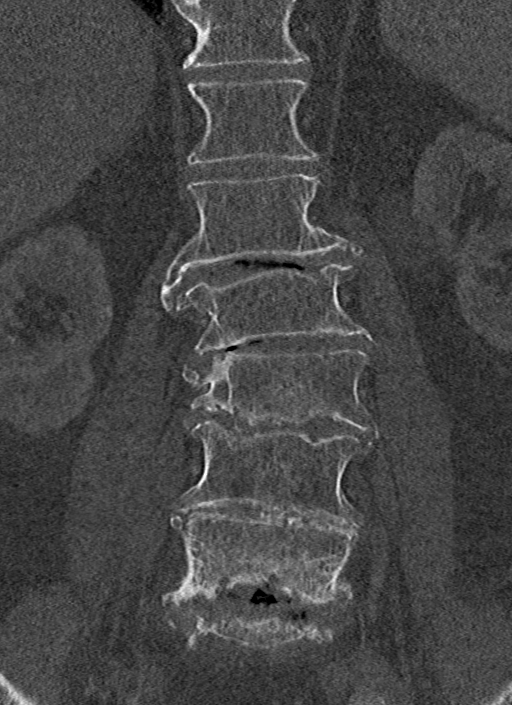

[Series 8: cap with · sagittal · 0.33mm/px · 5 of 231 slices shown, 6 images (3 of 3)]
[im 77/231  bone]
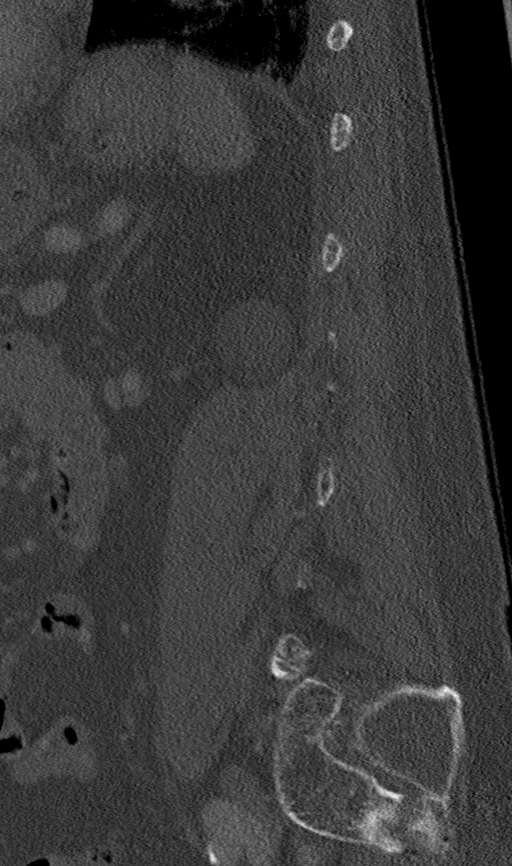
[im 96/231  bone]
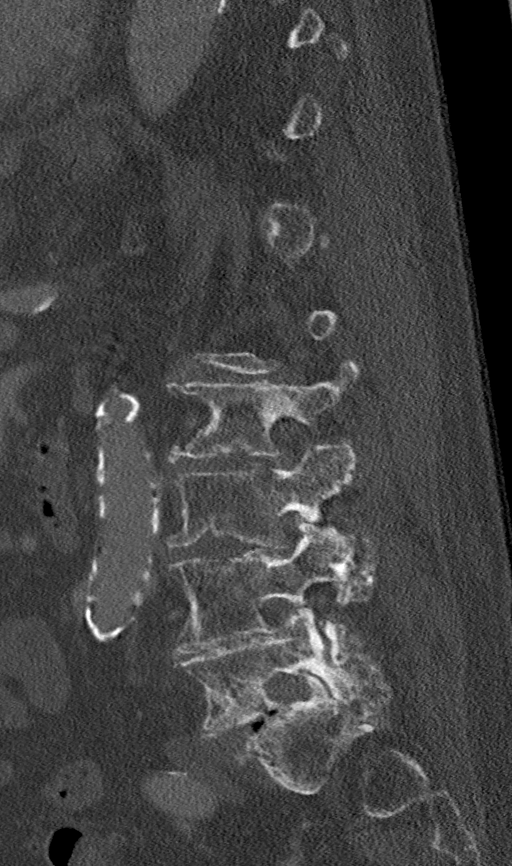
[im 116/231  soft-tissue]
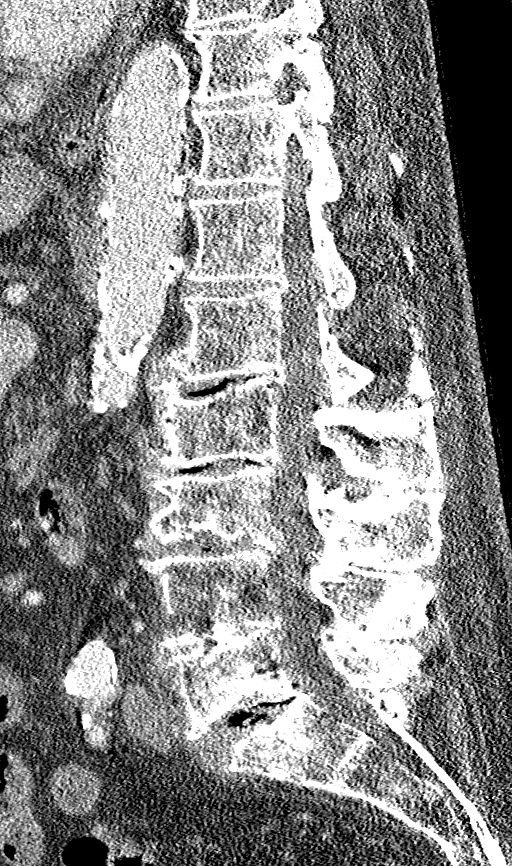
[im 116/231  bone]
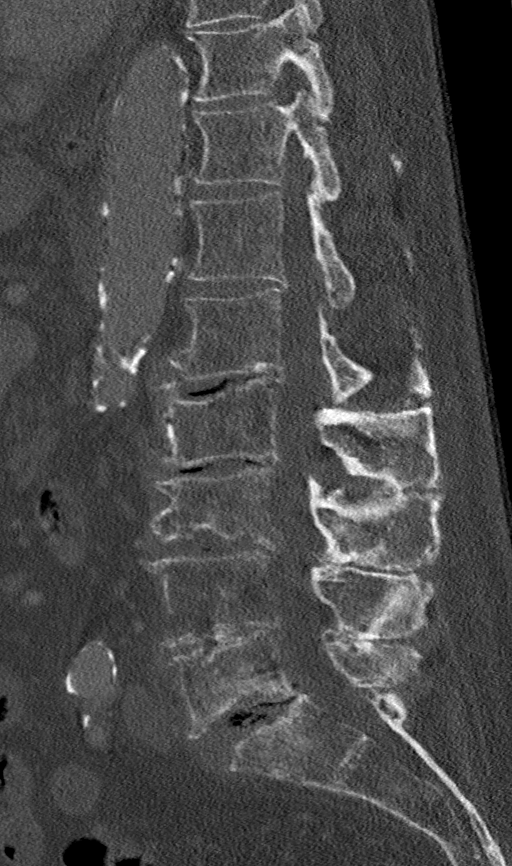
[im 135/231  bone]
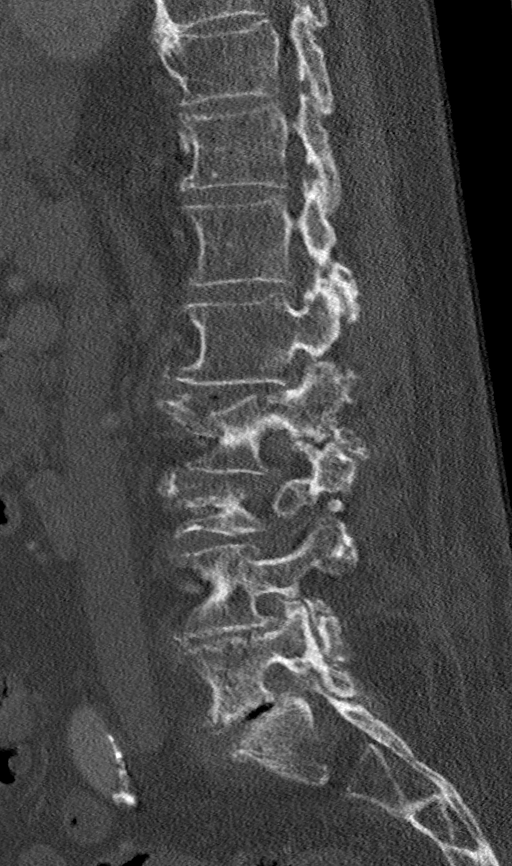
[im 154/231  bone]
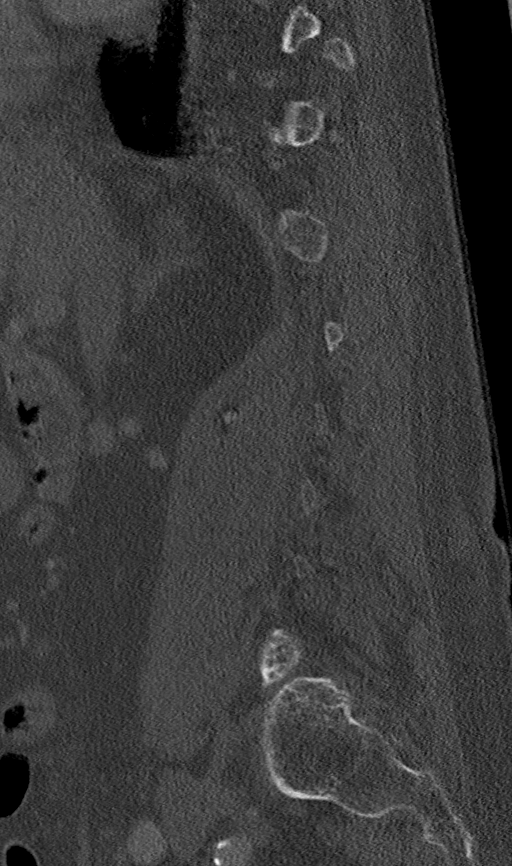

[12 of 33 positions shown; findings below may reference images not displayed]

FINDINGS: CT THORACIC SPINE FINDINGS

Alignment: Exaggerated kyphosis.  No listhesis.

Vertebrae: T6-10 ankylosis.  No acute fracture.

Paraspinal and other soft tissues: Please refer to dedicated CT
chest abdomen pelvis.

Disc levels: No spinal canal stenosis.

CT LUMBAR SPINE FINDINGS

Segmentation: Standard

Alignment: Grade 1 retrolisthesis at L3-4 and grade 1
anterolisthesis at L5-S1.

Vertebrae: No acute fracture.  Chronic compression deformity of L2.

Paraspinal and other soft tissues: Calcific aortic atherosclerosis.

Disc levels:

L1-2: Moderate bilateral foraminal stenosis. No spinal canal
stenosis.

L2-3: Facet arthrosis with severe right foraminal stenosis and mild
spinal canal stenosis.

L3-4: Facet arthrosis with severe right and moderate left neural
foraminal stenosis. Mild spinal canal stenosis.

L4-5: Facet arthrosis with severe right and moderate left foraminal
stenosis. No central spinal canal stenosis.

L5-S1: Severe facet arthrosis. No spinal canal stenosis. Mild right
and moderate left foraminal stenosis.
IMPRESSION: 1. No acute fracture of the thoracic or lumbar spine.
2. Chronic L2 compression deformity.
3. Severe multilevel lumbar facet arthrosis with multilevel severe
neural foraminal stenosis.

Aortic Atherosclerosis (HDK9V-BOG.G).

## 2021-07-29 IMAGING — CT CT T SPINE W/O CM
3 of 4 series · 12 of 33 positions shown, 14 images · IV contrast (agent unspecified)
Comparison: Lumbar spine MRI 01/20/2013

CLINICAL DATA: Fall

EXAM:
CT Thoracic and Lumbar spine without contrast
TECHNIQUE: Multiplanar CT images of the thoracic and lumbar spine were
reconstructed from contemporary CT of the Chest, Abdomen, and Pelvis
CONTRAST:  No additional

[Series 4: cap with · axial · 0.45mm/px · z∈[-516,-308]mm · 4 of 279 slices shown, 5 images]
[im 35/279  soft-tissue]
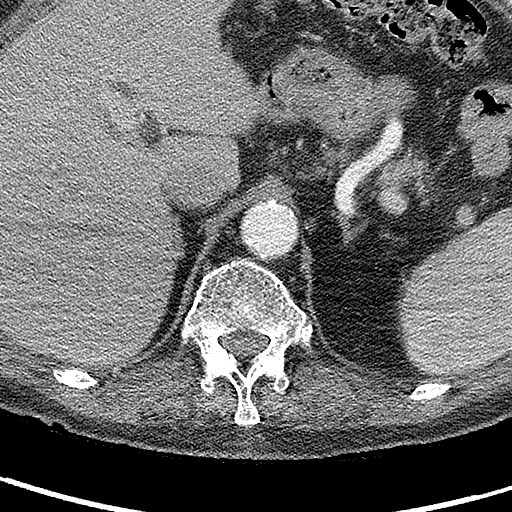
[im 35/279  bone]
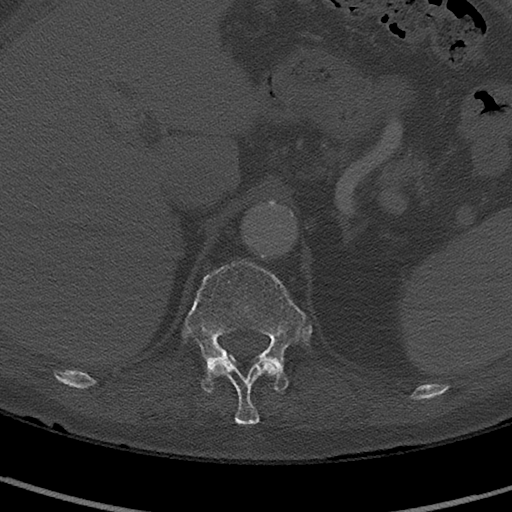
[im 105/279  bone]
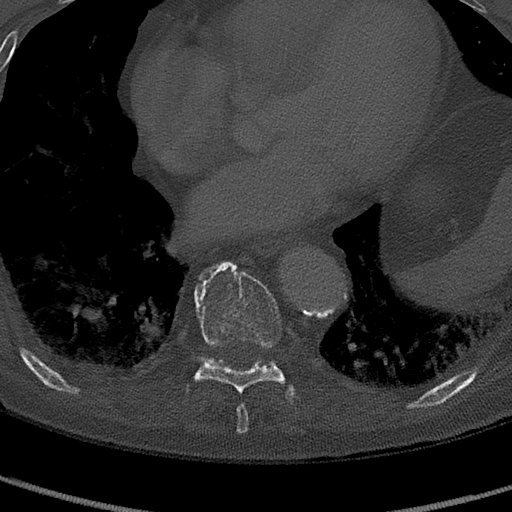
[im 174/279  bone]
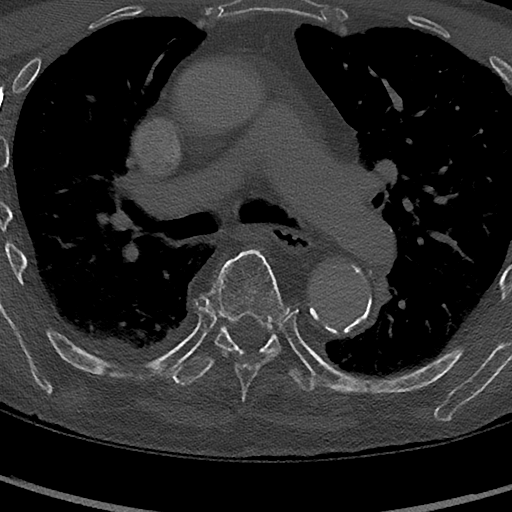
[im 244/279  bone]
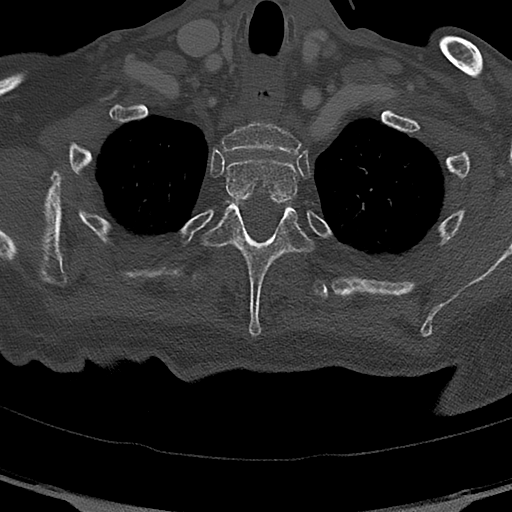

[Series 5: cor · coronal · 0.37mm/px · 3 of 223 slices shown]
[im 45/223  bone]
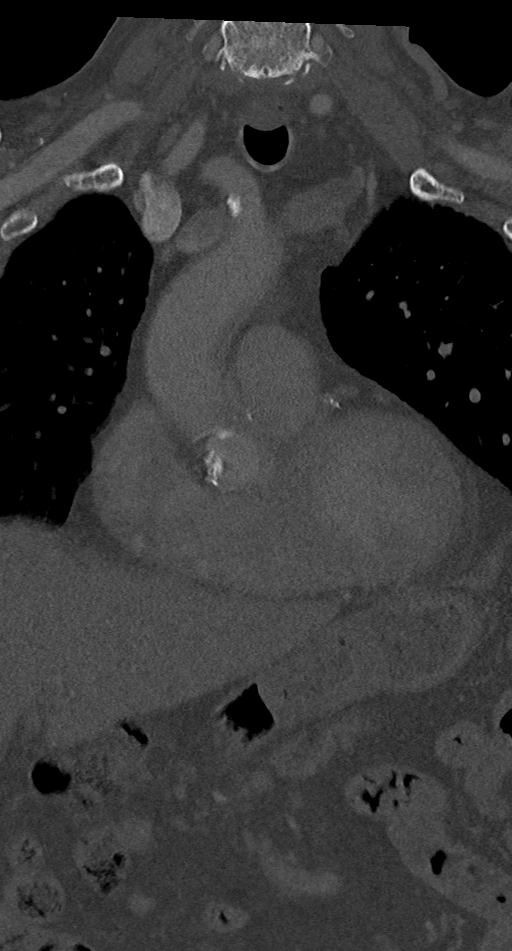
[im 89/223  bone]
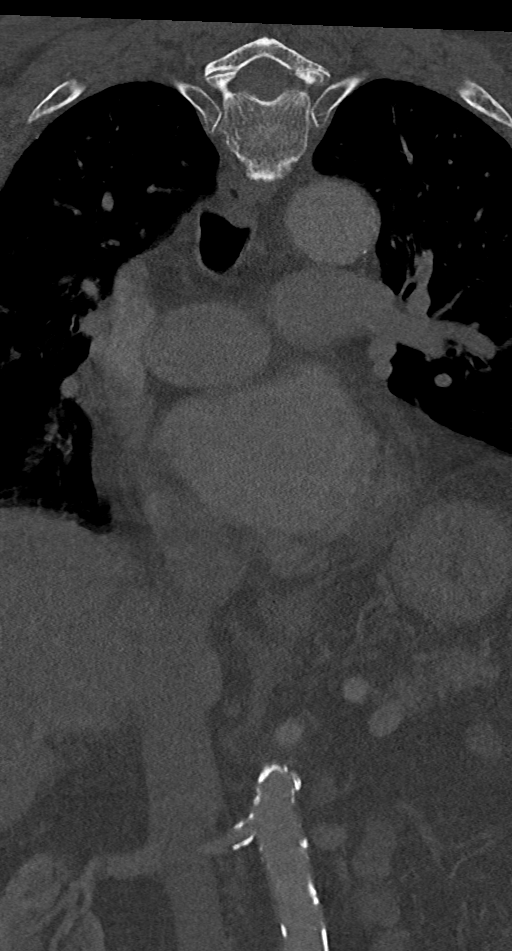
[im 134/223  bone]
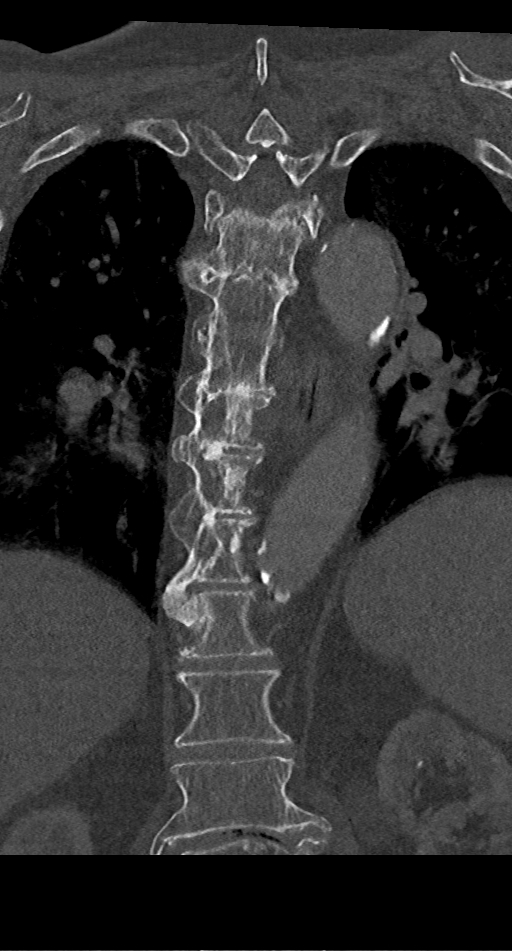

[Series 6: sag · sagittal · 0.46mm/px · 5 of 166 slices shown, 6 images]
[im 56/166  bone]
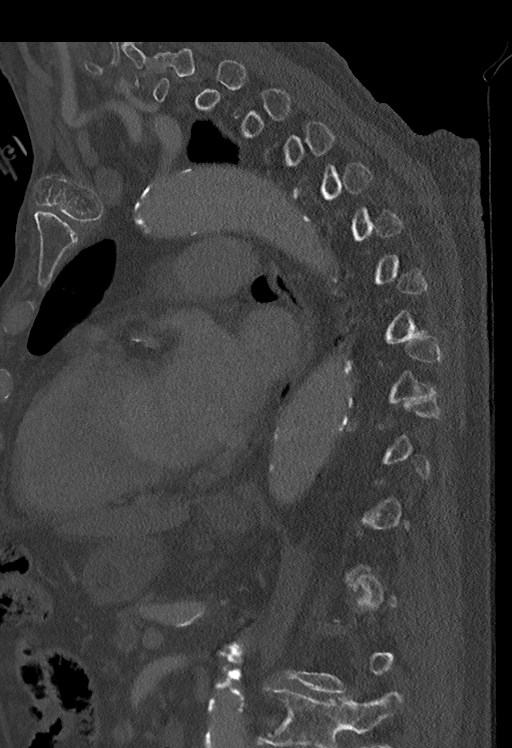
[im 69/166  bone]
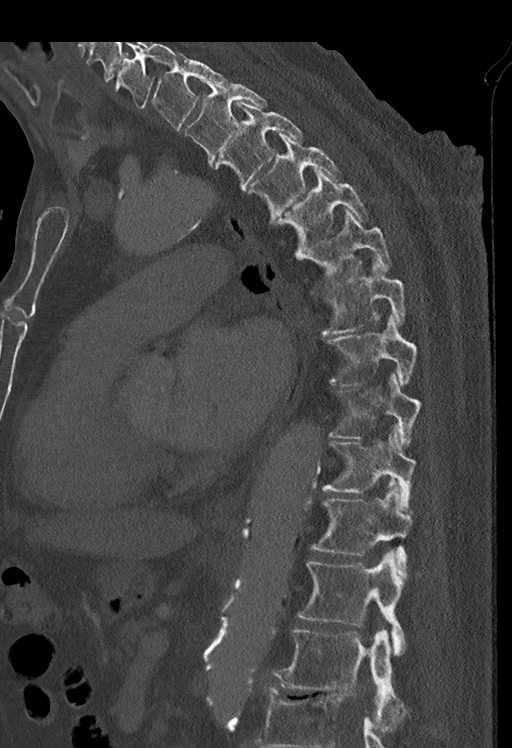
[im 83/166  soft-tissue]
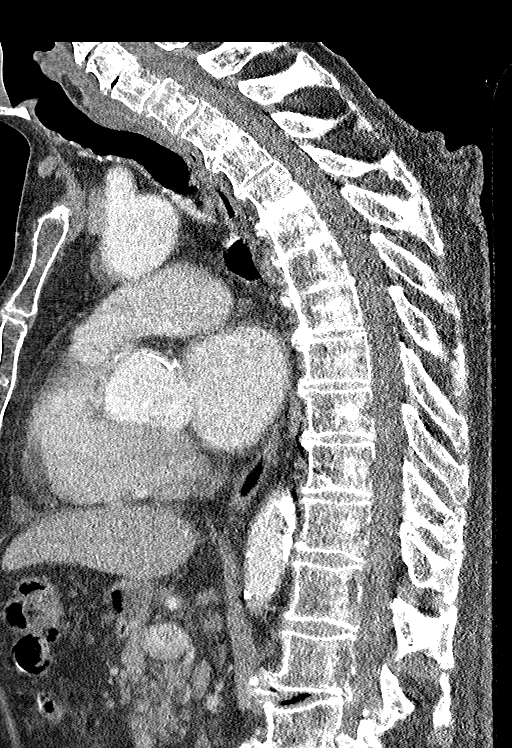
[im 83/166  bone]
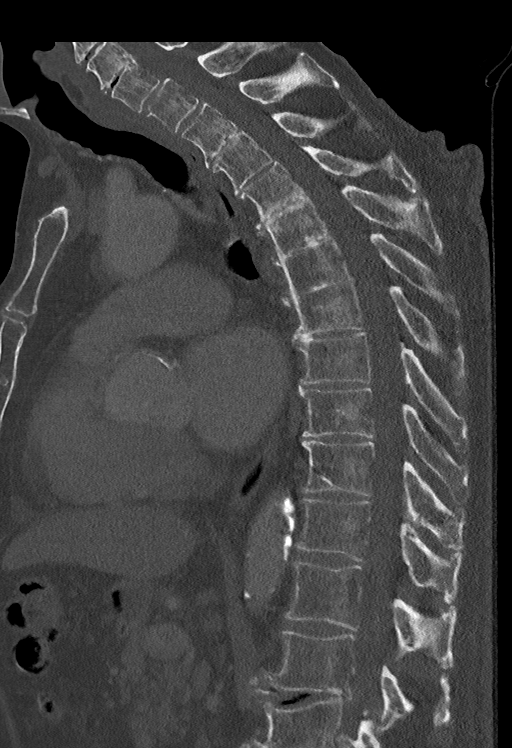
[im 97/166  bone]
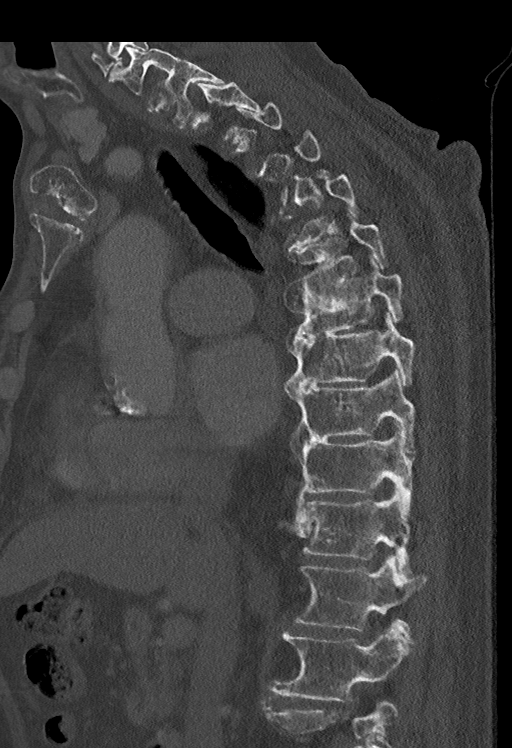
[im 111/166  bone]
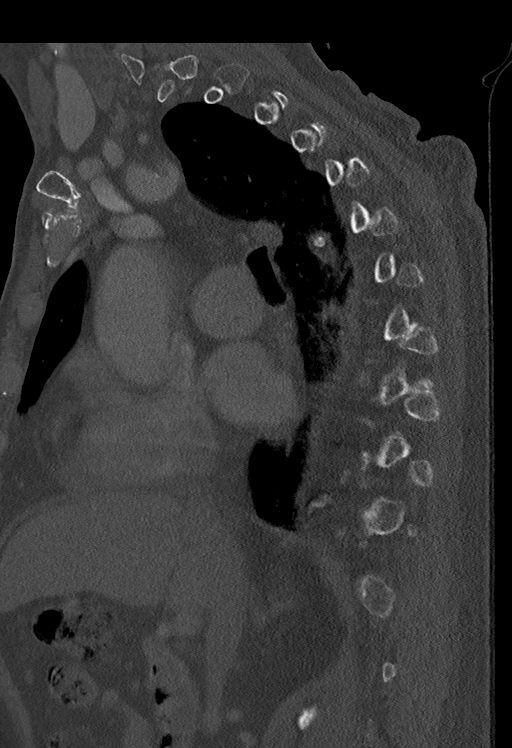

[12 of 33 positions shown; findings below may reference images not displayed]

FINDINGS: CT THORACIC SPINE FINDINGS

Alignment: Exaggerated kyphosis.  No listhesis.

Vertebrae: T6-10 ankylosis.  No acute fracture.

Paraspinal and other soft tissues: Please refer to dedicated CT
chest abdomen pelvis.

Disc levels: No spinal canal stenosis.

CT LUMBAR SPINE FINDINGS

Segmentation: Standard

Alignment: Grade 1 retrolisthesis at L3-4 and grade 1
anterolisthesis at L5-S1.

Vertebrae: No acute fracture.  Chronic compression deformity of L2.

Paraspinal and other soft tissues: Calcific aortic atherosclerosis.

Disc levels:

L1-2: Moderate bilateral foraminal stenosis. No spinal canal
stenosis.

L2-3: Facet arthrosis with severe right foraminal stenosis and mild
spinal canal stenosis.

L3-4: Facet arthrosis with severe right and moderate left neural
foraminal stenosis. Mild spinal canal stenosis.

L4-5: Facet arthrosis with severe right and moderate left foraminal
stenosis. No central spinal canal stenosis.

L5-S1: Severe facet arthrosis. No spinal canal stenosis. Mild right
and moderate left foraminal stenosis.
IMPRESSION: 1. No acute fracture of the thoracic or lumbar spine.
2. Chronic L2 compression deformity.
3. Severe multilevel lumbar facet arthrosis with multilevel severe
neural foraminal stenosis.

Aortic Atherosclerosis (HDK9V-BOG.G).

## 2021-07-29 IMAGING — DX DG CHEST 1V PORT
1 series · 1 of 1 positions shown · non-contrast
Comparison: 11/08/2019

CLINICAL DATA: Level 2 trauma. Fall on blood thinners. Low back
pain

EXAM:
PORTABLE CHEST 1 VIEW

[chest ap]
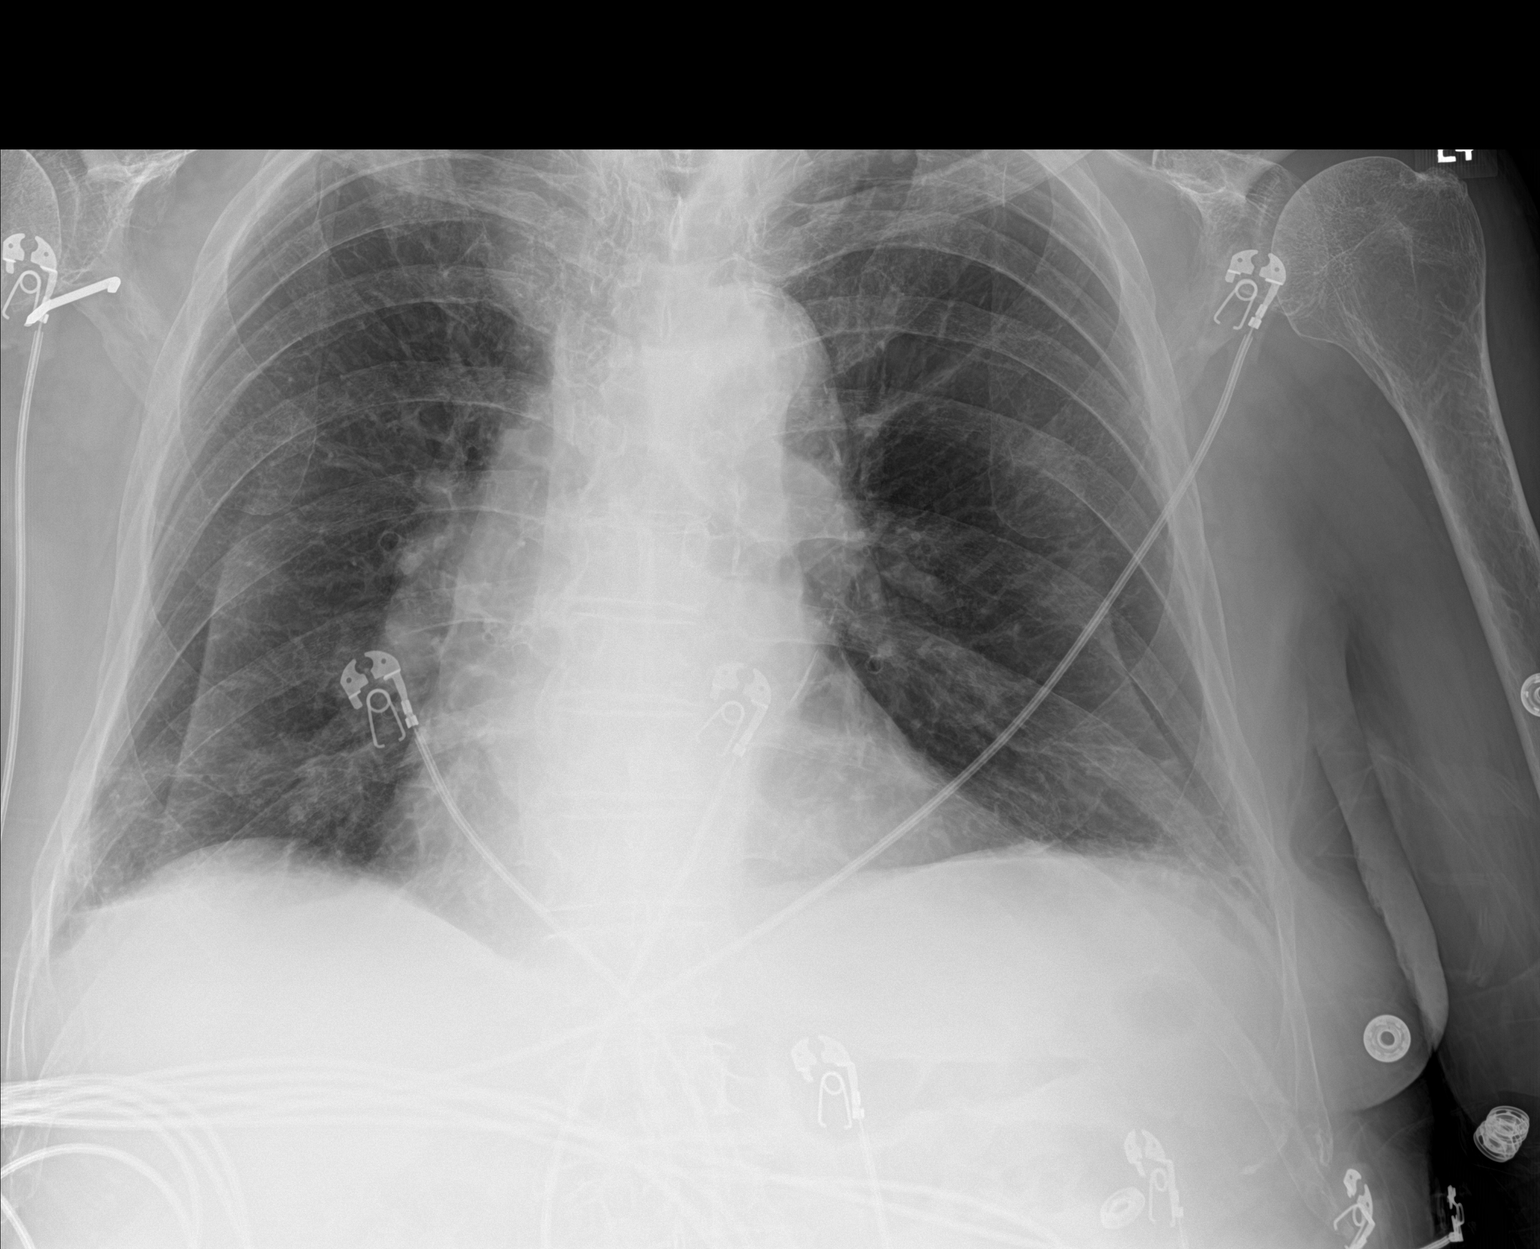

[1 of 1 positions shown; findings below may reference images not displayed]

FINDINGS: Linear atelectasis or fibrosis in the lung bases. No airspace
disease or consolidation in the lungs. No pneumothorax or effusion.
Heart size and pulmonary vascularity are normal for technique.
Calcified and tortuous aorta. Degenerative changes in the spine and
shoulders. Postoperative changes in the right shoulder.
IMPRESSION: Linear atelectasis or fibrosis in the lung bases.

## 2021-09-04 DIAGNOSIS — N183 Chronic kidney disease, stage 3 unspecified: Secondary | ICD-10-CM | POA: Diagnosis not present

## 2021-09-04 DIAGNOSIS — G2581 Restless legs syndrome: Secondary | ICD-10-CM | POA: Diagnosis not present

## 2021-09-04 DIAGNOSIS — Z6832 Body mass index (BMI) 32.0-32.9, adult: Secondary | ICD-10-CM | POA: Diagnosis not present

## 2021-09-04 DIAGNOSIS — I129 Hypertensive chronic kidney disease with stage 1 through stage 4 chronic kidney disease, or unspecified chronic kidney disease: Secondary | ICD-10-CM | POA: Diagnosis not present

## 2021-09-04 DIAGNOSIS — N2581 Secondary hyperparathyroidism of renal origin: Secondary | ICD-10-CM | POA: Diagnosis not present

## 2021-09-04 DIAGNOSIS — I10 Essential (primary) hypertension: Secondary | ICD-10-CM | POA: Diagnosis not present

## 2021-09-04 DIAGNOSIS — N184 Chronic kidney disease, stage 4 (severe): Secondary | ICD-10-CM | POA: Diagnosis not present

## 2021-09-29 DIAGNOSIS — M6530 Trigger finger, unspecified finger: Secondary | ICD-10-CM | POA: Diagnosis not present

## 2021-09-29 DIAGNOSIS — I10 Essential (primary) hypertension: Secondary | ICD-10-CM | POA: Diagnosis not present

## 2021-09-29 DIAGNOSIS — Z09 Encounter for follow-up examination after completed treatment for conditions other than malignant neoplasm: Secondary | ICD-10-CM | POA: Diagnosis not present

## 2021-09-29 DIAGNOSIS — Z6833 Body mass index (BMI) 33.0-33.9, adult: Secondary | ICD-10-CM | POA: Diagnosis not present

## 2021-09-29 DIAGNOSIS — M549 Dorsalgia, unspecified: Secondary | ICD-10-CM | POA: Diagnosis not present

## 2021-11-05 DIAGNOSIS — Z96652 Presence of left artificial knee joint: Secondary | ICD-10-CM | POA: Diagnosis not present

## 2021-11-05 DIAGNOSIS — Z96651 Presence of right artificial knee joint: Secondary | ICD-10-CM | POA: Diagnosis not present

## 2021-11-05 DIAGNOSIS — M25561 Pain in right knee: Secondary | ICD-10-CM | POA: Diagnosis not present

## 2021-11-05 DIAGNOSIS — M25562 Pain in left knee: Secondary | ICD-10-CM | POA: Diagnosis not present

## 2021-11-19 DIAGNOSIS — L578 Other skin changes due to chronic exposure to nonionizing radiation: Secondary | ICD-10-CM | POA: Diagnosis not present

## 2021-11-19 DIAGNOSIS — L570 Actinic keratosis: Secondary | ICD-10-CM | POA: Diagnosis not present

## 2021-11-19 DIAGNOSIS — R233 Spontaneous ecchymoses: Secondary | ICD-10-CM | POA: Diagnosis not present

## 2021-11-27 DIAGNOSIS — R197 Diarrhea, unspecified: Secondary | ICD-10-CM | POA: Diagnosis not present

## 2021-11-27 DIAGNOSIS — Z6833 Body mass index (BMI) 33.0-33.9, adult: Secondary | ICD-10-CM | POA: Diagnosis not present

## 2021-11-27 DIAGNOSIS — M549 Dorsalgia, unspecified: Secondary | ICD-10-CM | POA: Diagnosis not present

## 2022-01-05 DIAGNOSIS — E7800 Pure hypercholesterolemia, unspecified: Secondary | ICD-10-CM | POA: Diagnosis not present

## 2022-01-05 DIAGNOSIS — N184 Chronic kidney disease, stage 4 (severe): Secondary | ICD-10-CM | POA: Diagnosis not present

## 2022-01-05 DIAGNOSIS — I10 Essential (primary) hypertension: Secondary | ICD-10-CM | POA: Diagnosis not present

## 2022-01-05 DIAGNOSIS — N400 Enlarged prostate without lower urinary tract symptoms: Secondary | ICD-10-CM | POA: Diagnosis not present

## 2022-02-03 DIAGNOSIS — Z23 Encounter for immunization: Secondary | ICD-10-CM | POA: Diagnosis not present

## 2022-03-10 DIAGNOSIS — M65341 Trigger finger, right ring finger: Secondary | ICD-10-CM | POA: Diagnosis not present

## 2022-03-24 DIAGNOSIS — I129 Hypertensive chronic kidney disease with stage 1 through stage 4 chronic kidney disease, or unspecified chronic kidney disease: Secondary | ICD-10-CM | POA: Diagnosis not present

## 2022-03-24 DIAGNOSIS — N183 Chronic kidney disease, stage 3 unspecified: Secondary | ICD-10-CM | POA: Diagnosis not present

## 2022-03-24 DIAGNOSIS — N2581 Secondary hyperparathyroidism of renal origin: Secondary | ICD-10-CM | POA: Diagnosis not present

## 2022-05-25 DIAGNOSIS — L821 Other seborrheic keratosis: Secondary | ICD-10-CM | POA: Diagnosis not present

## 2022-05-25 DIAGNOSIS — D225 Melanocytic nevi of trunk: Secondary | ICD-10-CM | POA: Diagnosis not present

## 2022-05-25 DIAGNOSIS — L570 Actinic keratosis: Secondary | ICD-10-CM | POA: Diagnosis not present

## 2022-05-25 DIAGNOSIS — R233 Spontaneous ecchymoses: Secondary | ICD-10-CM | POA: Diagnosis not present

## 2022-05-25 DIAGNOSIS — Z08 Encounter for follow-up examination after completed treatment for malignant neoplasm: Secondary | ICD-10-CM | POA: Diagnosis not present

## 2022-05-25 DIAGNOSIS — L814 Other melanin hyperpigmentation: Secondary | ICD-10-CM | POA: Diagnosis not present

## 2022-05-25 DIAGNOSIS — Z85828 Personal history of other malignant neoplasm of skin: Secondary | ICD-10-CM | POA: Diagnosis not present

## 2022-05-25 DIAGNOSIS — L578 Other skin changes due to chronic exposure to nonionizing radiation: Secondary | ICD-10-CM | POA: Diagnosis not present

## 2022-06-18 DIAGNOSIS — M25561 Pain in right knee: Secondary | ICD-10-CM | POA: Diagnosis not present

## 2022-06-18 DIAGNOSIS — M25562 Pain in left knee: Secondary | ICD-10-CM | POA: Diagnosis not present

## 2022-06-19 DIAGNOSIS — Z0000 Encounter for general adult medical examination without abnormal findings: Secondary | ICD-10-CM | POA: Diagnosis not present

## 2022-06-19 DIAGNOSIS — I10 Essential (primary) hypertension: Secondary | ICD-10-CM | POA: Diagnosis not present

## 2022-06-19 DIAGNOSIS — N1832 Chronic kidney disease, stage 3b: Secondary | ICD-10-CM | POA: Diagnosis not present

## 2022-06-19 DIAGNOSIS — E039 Hypothyroidism, unspecified: Secondary | ICD-10-CM | POA: Diagnosis not present

## 2022-06-19 DIAGNOSIS — Z6833 Body mass index (BMI) 33.0-33.9, adult: Secondary | ICD-10-CM | POA: Diagnosis not present

## 2022-06-19 DIAGNOSIS — N400 Enlarged prostate without lower urinary tract symptoms: Secondary | ICD-10-CM | POA: Diagnosis not present

## 2022-06-19 DIAGNOSIS — I700 Atherosclerosis of aorta: Secondary | ICD-10-CM | POA: Diagnosis not present

## 2022-06-19 DIAGNOSIS — E7800 Pure hypercholesterolemia, unspecified: Secondary | ICD-10-CM | POA: Diagnosis not present

## 2022-06-19 DIAGNOSIS — M549 Dorsalgia, unspecified: Secondary | ICD-10-CM | POA: Diagnosis not present

## 2022-07-09 DIAGNOSIS — E7800 Pure hypercholesterolemia, unspecified: Secondary | ICD-10-CM | POA: Diagnosis not present

## 2022-07-09 DIAGNOSIS — E039 Hypothyroidism, unspecified: Secondary | ICD-10-CM | POA: Diagnosis not present

## 2022-07-09 DIAGNOSIS — N1832 Chronic kidney disease, stage 3b: Secondary | ICD-10-CM | POA: Diagnosis not present

## 2022-07-09 DIAGNOSIS — N400 Enlarged prostate without lower urinary tract symptoms: Secondary | ICD-10-CM | POA: Diagnosis not present

## 2022-07-09 DIAGNOSIS — I10 Essential (primary) hypertension: Secondary | ICD-10-CM | POA: Diagnosis not present

## 2022-07-17 DIAGNOSIS — H6123 Impacted cerumen, bilateral: Secondary | ICD-10-CM | POA: Diagnosis not present

## 2022-08-05 DIAGNOSIS — E039 Hypothyroidism, unspecified: Secondary | ICD-10-CM | POA: Diagnosis not present

## 2022-08-05 DIAGNOSIS — E7800 Pure hypercholesterolemia, unspecified: Secondary | ICD-10-CM | POA: Diagnosis not present

## 2022-08-05 DIAGNOSIS — I10 Essential (primary) hypertension: Secondary | ICD-10-CM | POA: Diagnosis not present

## 2022-08-05 DIAGNOSIS — N1832 Chronic kidney disease, stage 3b: Secondary | ICD-10-CM | POA: Diagnosis not present

## 2022-08-05 DIAGNOSIS — N400 Enlarged prostate without lower urinary tract symptoms: Secondary | ICD-10-CM | POA: Diagnosis not present

## 2022-08-24 DIAGNOSIS — Z85828 Personal history of other malignant neoplasm of skin: Secondary | ICD-10-CM | POA: Diagnosis not present

## 2022-08-24 DIAGNOSIS — L578 Other skin changes due to chronic exposure to nonionizing radiation: Secondary | ICD-10-CM | POA: Diagnosis not present

## 2022-08-24 DIAGNOSIS — L570 Actinic keratosis: Secondary | ICD-10-CM | POA: Diagnosis not present

## 2022-08-24 DIAGNOSIS — Z08 Encounter for follow-up examination after completed treatment for malignant neoplasm: Secondary | ICD-10-CM | POA: Diagnosis not present

## 2022-09-22 DIAGNOSIS — I10 Essential (primary) hypertension: Secondary | ICD-10-CM | POA: Diagnosis not present

## 2022-09-22 DIAGNOSIS — N400 Enlarged prostate without lower urinary tract symptoms: Secondary | ICD-10-CM | POA: Diagnosis not present

## 2022-09-22 DIAGNOSIS — E039 Hypothyroidism, unspecified: Secondary | ICD-10-CM | POA: Diagnosis not present

## 2022-09-22 DIAGNOSIS — E7800 Pure hypercholesterolemia, unspecified: Secondary | ICD-10-CM | POA: Diagnosis not present

## 2022-09-22 DIAGNOSIS — N1832 Chronic kidney disease, stage 3b: Secondary | ICD-10-CM | POA: Diagnosis not present

## 2022-12-17 DIAGNOSIS — Z6833 Body mass index (BMI) 33.0-33.9, adult: Secondary | ICD-10-CM | POA: Diagnosis not present

## 2022-12-17 DIAGNOSIS — M545 Low back pain, unspecified: Secondary | ICD-10-CM | POA: Diagnosis not present

## 2022-12-17 DIAGNOSIS — Z09 Encounter for follow-up examination after completed treatment for conditions other than malignant neoplasm: Secondary | ICD-10-CM | POA: Diagnosis not present

## 2023-01-05 DIAGNOSIS — L821 Other seborrheic keratosis: Secondary | ICD-10-CM | POA: Diagnosis not present

## 2023-01-05 DIAGNOSIS — Z85828 Personal history of other malignant neoplasm of skin: Secondary | ICD-10-CM | POA: Diagnosis not present

## 2023-01-05 DIAGNOSIS — L570 Actinic keratosis: Secondary | ICD-10-CM | POA: Diagnosis not present

## 2023-01-05 DIAGNOSIS — Z08 Encounter for follow-up examination after completed treatment for malignant neoplasm: Secondary | ICD-10-CM | POA: Diagnosis not present

## 2023-01-05 DIAGNOSIS — L814 Other melanin hyperpigmentation: Secondary | ICD-10-CM | POA: Diagnosis not present

## 2023-01-05 DIAGNOSIS — D225 Melanocytic nevi of trunk: Secondary | ICD-10-CM | POA: Diagnosis not present

## 2023-01-11 DIAGNOSIS — Z23 Encounter for immunization: Secondary | ICD-10-CM | POA: Diagnosis not present

## 2023-01-28 ENCOUNTER — Inpatient Hospital Stay (HOSPITAL_COMMUNITY)
Admission: EM | Admit: 2023-01-28 | Discharge: 2023-02-01 | DRG: 482 | Disposition: A | Discharge status: Skilled Nursing Facility | Payer: Medicare Other | Attending: Internal Medicine | Admitting: Internal Medicine

## 2023-01-28 ENCOUNTER — Encounter (HOSPITAL_COMMUNITY): Payer: Self-pay

## 2023-01-28 ENCOUNTER — Emergency Department (HOSPITAL_COMMUNITY): Payer: Medicare Other

## 2023-01-28 ENCOUNTER — Inpatient Hospital Stay (HOSPITAL_COMMUNITY): Payer: Medicare Other

## 2023-01-28 ENCOUNTER — Other Ambulatory Visit: Payer: Self-pay

## 2023-01-28 ENCOUNTER — Encounter (HOSPITAL_COMMUNITY)
Admission: EM | Disposition: A | Discharge status: Skilled Nursing Facility | Payer: Self-pay | Source: Home / Self Care | Attending: Internal Medicine

## 2023-01-28 DIAGNOSIS — Y9301 Activity, walking, marching and hiking: Secondary | ICD-10-CM | POA: Diagnosis present

## 2023-01-28 DIAGNOSIS — S80919A Unspecified superficial injury of unspecified knee, initial encounter: Secondary | ICD-10-CM | POA: Diagnosis not present

## 2023-01-28 DIAGNOSIS — Y92009 Unspecified place in unspecified non-institutional (private) residence as the place of occurrence of the external cause: Secondary | ICD-10-CM

## 2023-01-28 DIAGNOSIS — N189 Chronic kidney disease, unspecified: Secondary | ICD-10-CM

## 2023-01-28 DIAGNOSIS — Z96653 Presence of artificial knee joint, bilateral: Secondary | ICD-10-CM | POA: Diagnosis present

## 2023-01-28 DIAGNOSIS — D631 Anemia in chronic kidney disease: Secondary | ICD-10-CM | POA: Diagnosis present

## 2023-01-28 DIAGNOSIS — E559 Vitamin D deficiency, unspecified: Secondary | ICD-10-CM | POA: Diagnosis present

## 2023-01-28 DIAGNOSIS — Z809 Family history of malignant neoplasm, unspecified: Secondary | ICD-10-CM | POA: Diagnosis not present

## 2023-01-28 DIAGNOSIS — Z86718 Personal history of other venous thrombosis and embolism: Secondary | ICD-10-CM | POA: Diagnosis not present

## 2023-01-28 DIAGNOSIS — Z9181 History of falling: Secondary | ICD-10-CM | POA: Diagnosis not present

## 2023-01-28 DIAGNOSIS — W010XXA Fall on same level from slipping, tripping and stumbling without subsequent striking against object, initial encounter: Secondary | ICD-10-CM | POA: Diagnosis present

## 2023-01-28 DIAGNOSIS — R0989 Other specified symptoms and signs involving the circulatory and respiratory systems: Secondary | ICD-10-CM | POA: Diagnosis not present

## 2023-01-28 DIAGNOSIS — Z8711 Personal history of peptic ulcer disease: Secondary | ICD-10-CM | POA: Diagnosis not present

## 2023-01-28 DIAGNOSIS — T8484XD Pain due to internal orthopedic prosthetic devices, implants and grafts, subsequent encounter: Secondary | ICD-10-CM | POA: Diagnosis not present

## 2023-01-28 DIAGNOSIS — I129 Hypertensive chronic kidney disease with stage 1 through stage 4 chronic kidney disease, or unspecified chronic kidney disease: Secondary | ICD-10-CM | POA: Diagnosis present

## 2023-01-28 DIAGNOSIS — M25461 Effusion, right knee: Secondary | ICD-10-CM | POA: Diagnosis not present

## 2023-01-28 DIAGNOSIS — H9190 Unspecified hearing loss, unspecified ear: Secondary | ICD-10-CM | POA: Diagnosis not present

## 2023-01-28 DIAGNOSIS — D696 Thrombocytopenia, unspecified: Secondary | ICD-10-CM | POA: Diagnosis present

## 2023-01-28 DIAGNOSIS — Z743 Need for continuous supervision: Secondary | ICD-10-CM | POA: Diagnosis not present

## 2023-01-28 DIAGNOSIS — R2681 Unsteadiness on feet: Secondary | ICD-10-CM | POA: Diagnosis not present

## 2023-01-28 DIAGNOSIS — Z79899 Other long term (current) drug therapy: Secondary | ICD-10-CM

## 2023-01-28 DIAGNOSIS — M25561 Pain in right knee: Secondary | ICD-10-CM | POA: Diagnosis not present

## 2023-01-28 DIAGNOSIS — S72141A Displaced intertrochanteric fracture of right femur, initial encounter for closed fracture: Secondary | ICD-10-CM | POA: Diagnosis not present

## 2023-01-28 DIAGNOSIS — Z87891 Personal history of nicotine dependence: Secondary | ICD-10-CM | POA: Diagnosis not present

## 2023-01-28 DIAGNOSIS — I771 Stricture of artery: Secondary | ICD-10-CM | POA: Diagnosis not present

## 2023-01-28 DIAGNOSIS — M160 Bilateral primary osteoarthritis of hip: Secondary | ICD-10-CM | POA: Diagnosis not present

## 2023-01-28 DIAGNOSIS — I10 Essential (primary) hypertension: Secondary | ICD-10-CM | POA: Diagnosis not present

## 2023-01-28 DIAGNOSIS — D8481 Immunodeficiency due to conditions classified elsewhere: Secondary | ICD-10-CM | POA: Diagnosis not present

## 2023-01-28 DIAGNOSIS — M6281 Muscle weakness (generalized): Secondary | ICD-10-CM | POA: Diagnosis not present

## 2023-01-28 DIAGNOSIS — Z96651 Presence of right artificial knee joint: Secondary | ICD-10-CM | POA: Diagnosis not present

## 2023-01-28 DIAGNOSIS — Z888 Allergy status to other drugs, medicaments and biological substances status: Secondary | ICD-10-CM

## 2023-01-28 DIAGNOSIS — S72144A Nondisplaced intertrochanteric fracture of right femur, initial encounter for closed fracture: Principal | ICD-10-CM | POA: Diagnosis present

## 2023-01-28 DIAGNOSIS — N400 Enlarged prostate without lower urinary tract symptoms: Secondary | ICD-10-CM | POA: Diagnosis present

## 2023-01-28 DIAGNOSIS — I700 Atherosclerosis of aorta: Secondary | ICD-10-CM | POA: Diagnosis not present

## 2023-01-28 DIAGNOSIS — Z79890 Hormone replacement therapy (postmenopausal): Secondary | ICD-10-CM

## 2023-01-28 DIAGNOSIS — S72001A Fracture of unspecified part of neck of right femur, initial encounter for closed fracture: Secondary | ICD-10-CM | POA: Diagnosis not present

## 2023-01-28 DIAGNOSIS — R41841 Cognitive communication deficit: Secondary | ICD-10-CM | POA: Diagnosis not present

## 2023-01-28 DIAGNOSIS — E039 Hypothyroidism, unspecified: Secondary | ICD-10-CM | POA: Diagnosis present

## 2023-01-28 DIAGNOSIS — N1832 Chronic kidney disease, stage 3b: Secondary | ICD-10-CM | POA: Diagnosis present

## 2023-01-28 DIAGNOSIS — W19XXXA Unspecified fall, initial encounter: Principal | ICD-10-CM

## 2023-01-28 DIAGNOSIS — S72009A Fracture of unspecified part of neck of unspecified femur, initial encounter for closed fracture: Secondary | ICD-10-CM | POA: Diagnosis not present

## 2023-01-28 DIAGNOSIS — M4696 Unspecified inflammatory spondylopathy, lumbar region: Secondary | ICD-10-CM | POA: Diagnosis not present

## 2023-01-28 DIAGNOSIS — I120 Hypertensive chronic kidney disease with stage 5 chronic kidney disease or end stage renal disease: Secondary | ICD-10-CM | POA: Diagnosis not present

## 2023-01-28 DIAGNOSIS — J952 Acute pulmonary insufficiency following nonthoracic surgery: Secondary | ICD-10-CM | POA: Diagnosis not present

## 2023-01-28 DIAGNOSIS — Z7901 Long term (current) use of anticoagulants: Secondary | ICD-10-CM | POA: Diagnosis not present

## 2023-01-28 DIAGNOSIS — R58 Hemorrhage, not elsewhere classified: Secondary | ICD-10-CM | POA: Diagnosis not present

## 2023-01-28 DIAGNOSIS — Z7982 Long term (current) use of aspirin: Secondary | ICD-10-CM

## 2023-01-28 DIAGNOSIS — S72141D Displaced intertrochanteric fracture of right femur, subsequent encounter for closed fracture with routine healing: Secondary | ICD-10-CM | POA: Diagnosis not present

## 2023-01-28 DIAGNOSIS — M7651 Patellar tendinitis, right knee: Secondary | ICD-10-CM | POA: Diagnosis not present

## 2023-01-28 DIAGNOSIS — S79911A Unspecified injury of right hip, initial encounter: Secondary | ICD-10-CM | POA: Diagnosis not present

## 2023-01-28 DIAGNOSIS — S79929A Unspecified injury of unspecified thigh, initial encounter: Secondary | ICD-10-CM | POA: Diagnosis not present

## 2023-01-28 HISTORY — PX: INTRAMEDULLARY (IM) NAIL INTERTROCHANTERIC: SHX5875

## 2023-01-28 LAB — BASIC METABOLIC PANEL
Anion gap: 14 (ref 5–15)
BUN: 28 mg/dL — ABNORMAL HIGH (ref 8–23)
CO2: 19 mmol/L — ABNORMAL LOW (ref 22–32)
Calcium: 8.9 mg/dL (ref 8.9–10.3)
Chloride: 108 mmol/L (ref 98–111)
Creatinine, Ser: 1.6 mg/dL — ABNORMAL HIGH (ref 0.61–1.24)
GFR, Estimated: 41 mL/min — ABNORMAL LOW (ref >60)
Glucose, Bld: 102 mg/dL — ABNORMAL HIGH (ref 70–99)
Potassium: 4.2 mmol/L (ref 3.5–5.1)
Sodium: 141 mmol/L (ref 135–145)

## 2023-01-28 LAB — CBC WITH DIFFERENTIAL/PLATELET
Abs Immature Granulocytes: 0.02 10*3/uL (ref 0.00–0.07)
Basophils Absolute: 0 10*3/uL (ref 0.0–0.1)
Basophils Relative: 1 %
Eosinophils Absolute: 0.1 10*3/uL (ref 0.0–0.5)
Eosinophils Relative: 1 %
HCT: 44.4 % (ref 39.0–52.0)
Hemoglobin: 15.2 g/dL (ref 13.0–17.0)
Immature Granulocytes: 0 %
Lymphocytes Relative: 18 %
Lymphs Abs: 1.2 10*3/uL (ref 0.7–4.0)
MCH: 33 pg (ref 26.0–34.0)
MCHC: 34.2 g/dL (ref 30.0–36.0)
MCV: 96.3 fL (ref 80.0–100.0)
Monocytes Absolute: 0.6 10*3/uL (ref 0.1–1.0)
Monocytes Relative: 10 %
Neutro Abs: 4.7 10*3/uL (ref 1.7–7.7)
Neutrophils Relative %: 70 %
Platelets: 146 10*3/uL — ABNORMAL LOW (ref 150–400)
RBC: 4.61 MIL/uL (ref 4.22–5.81)
RDW: 14 % (ref 11.5–15.5)
WBC: 6.7 10*3/uL (ref 4.0–10.5)
nRBC: 0 % (ref 0.0–0.2)

## 2023-01-28 LAB — SURGICAL PCR SCREEN

## 2023-01-28 LAB — TYPE AND SCREEN
ABO/RH(D): A POS
Antibody Screen: NEGATIVE

## 2023-01-28 LAB — PROTIME-INR
INR: 1.2 (ref 0.8–1.2)
Prothrombin Time: 15 s (ref 11.4–15.2)

## 2023-01-28 SURGERY — FIXATION, FRACTURE, INTERTROCHANTERIC, WITH INTRAMEDULLARY ROD
Anesthesia: General | Laterality: Right

## 2023-01-28 MED ORDER — ACETAMINOPHEN 500 MG PO TABS
500.0000 mg | ORAL_TABLET | Freq: Four times a day (QID) | ORAL | Status: AC
  Filled 2023-01-28 (×2): qty 1

## 2023-01-28 MED ORDER — HYDROCODONE-ACETAMINOPHEN 7.5-325 MG PO TABS
1.0000 | ORAL_TABLET | ORAL | Status: DC | PRN
  Administered 2023-01-28 – 2023-01-29 (×2): 2 via ORAL
  Administered 2023-01-29: 1 via ORAL
  Administered 2023-01-29: 2 via ORAL
  Administered 2023-01-30 – 2023-01-31 (×6): 1 via ORAL
  Filled 2023-01-28: qty 1
  Filled 2023-01-28: qty 2
  Filled 2023-01-28 (×2): qty 1
  Filled 2023-01-28: qty 2
  Filled 2023-01-28 (×2): qty 1
  Filled 2023-01-28: qty 2
  Filled 2023-01-28 (×2): qty 1

## 2023-01-28 MED ORDER — FENTANYL CITRATE (PF) 100 MCG/2ML IJ SOLN
25.0000 ug | INTRAMUSCULAR | Status: DC | PRN
  Administered 2023-01-28 (×2): 25 ug via INTRAVENOUS

## 2023-01-28 MED ORDER — FENTANYL CITRATE (PF) 250 MCG/5ML IJ SOLN
INTRAMUSCULAR | Status: DC | PRN
  Administered 2023-01-28 (×2): 25 ug via INTRAVENOUS
  Administered 2023-01-28: 50 ug via INTRAVENOUS

## 2023-01-28 MED ORDER — SODIUM CHLORIDE 0.9 % IV SOLN: INTRAVENOUS | Status: DC

## 2023-01-28 MED ORDER — MENTHOL 3 MG MT LOZG: 1.0000 | LOZENGE | OROMUCOSAL | Status: DC | PRN

## 2023-01-28 MED ORDER — ORAL CARE MOUTH RINSE: 15.0000 mL | Freq: Once | OROMUCOSAL | Status: AC

## 2023-01-28 MED ORDER — METOCLOPRAMIDE HCL 5 MG PO TABS: 5.0000 mg | ORAL_TABLET | Freq: Three times a day (TID) | ORAL | Status: DC | PRN

## 2023-01-28 MED ORDER — ROCURONIUM BROMIDE 10 MG/ML (PF) SYRINGE
PREFILLED_SYRINGE | INTRAVENOUS | Status: AC
  Filled 2023-01-28: qty 10

## 2023-01-28 MED ORDER — SUCCINYLCHOLINE CHLORIDE 200 MG/10ML IV SOSY
PREFILLED_SYRINGE | INTRAVENOUS | Status: AC
  Filled 2023-01-28: qty 10

## 2023-01-28 MED ORDER — SUCCINYLCHOLINE CHLORIDE 200 MG/10ML IV SOSY
PREFILLED_SYRINGE | INTRAVENOUS | Status: DC | PRN
  Administered 2023-01-28: 100 mg via INTRAVENOUS

## 2023-01-28 MED ORDER — LIDOCAINE 2% (20 MG/ML) 5 ML SYRINGE
INTRAMUSCULAR | Status: DC | PRN
  Administered 2023-01-28: 80 mg via INTRAVENOUS

## 2023-01-28 MED ORDER — PHENOL 1.4 % MT LIQD: 1.0000 | OROMUCOSAL | Status: DC | PRN

## 2023-01-28 MED ORDER — METHOCARBAMOL 500 MG PO TABS: 500.0000 mg | ORAL_TABLET | Freq: Four times a day (QID) | ORAL | Status: DC | PRN

## 2023-01-28 MED ORDER — FENTANYL CITRATE PF 50 MCG/ML IJ SOSY
50.0000 ug | PREFILLED_SYRINGE | Freq: Once | INTRAMUSCULAR | Status: AC
  Administered 2023-01-28: 50 ug via INTRAVENOUS
  Filled 2023-01-28: qty 1

## 2023-01-28 MED ORDER — PHENYLEPHRINE 80 MCG/ML (10ML) SYRINGE FOR IV PUSH (FOR BLOOD PRESSURE SUPPORT)
PREFILLED_SYRINGE | INTRAVENOUS | Status: AC
  Filled 2023-01-28: qty 10

## 2023-01-28 MED ORDER — PHENYLEPHRINE 80 MCG/ML (10ML) SYRINGE FOR IV PUSH (FOR BLOOD PRESSURE SUPPORT)
PREFILLED_SYRINGE | INTRAVENOUS | Status: DC | PRN
  Administered 2023-01-28: 80 ug via INTRAVENOUS
  Administered 2023-01-28 (×3): 160 ug via INTRAVENOUS

## 2023-01-28 MED ORDER — ONDANSETRON HCL 4 MG/2ML IJ SOLN: 4.0000 mg | Freq: Four times a day (QID) | INTRAMUSCULAR | Status: DC | PRN

## 2023-01-28 MED ORDER — LIDOCAINE 2% (20 MG/ML) 5 ML SYRINGE
INTRAMUSCULAR | Status: AC
  Filled 2023-01-28: qty 5

## 2023-01-28 MED ORDER — ONDANSETRON HCL 4 MG/2ML IJ SOLN: 4.0000 mg | Freq: Once | INTRAMUSCULAR | Status: DC | PRN

## 2023-01-28 MED ORDER — MORPHINE SULFATE (PF) 2 MG/ML IV SOLN: 0.5000 mg | INTRAVENOUS | Status: DC | PRN

## 2023-01-28 MED ORDER — HYDROCODONE-ACETAMINOPHEN 5-325 MG PO TABS: 1.0000 | ORAL_TABLET | ORAL | 0 refills | Status: DC | PRN

## 2023-01-28 MED ORDER — ACETAMINOPHEN 325 MG PO TABS: 650.0000 mg | ORAL_TABLET | Freq: Four times a day (QID) | ORAL | Status: DC | PRN

## 2023-01-28 MED ORDER — POVIDONE-IODINE 10 % EX SWAB
2.0000 | Freq: Once | CUTANEOUS | Status: AC
  Administered 2023-01-28: 2 via TOPICAL

## 2023-01-28 MED ORDER — DOCUSATE SODIUM 100 MG PO CAPS
100.0000 mg | ORAL_CAPSULE | Freq: Two times a day (BID) | ORAL | Status: DC
  Administered 2023-01-28 – 2023-02-01 (×8): 100 mg via ORAL
  Filled 2023-01-28 (×8): qty 1

## 2023-01-28 MED ORDER — PROPOFOL 10 MG/ML IV BOLUS
INTRAVENOUS | Status: AC
  Filled 2023-01-28: qty 20

## 2023-01-28 MED ORDER — SUGAMMADEX SODIUM 200 MG/2ML IV SOLN
INTRAVENOUS | Status: DC | PRN
  Administered 2023-01-28: 200 mg via INTRAVENOUS

## 2023-01-28 MED ORDER — FENTANYL CITRATE (PF) 250 MCG/5ML IJ SOLN
INTRAMUSCULAR | Status: AC
  Filled 2023-01-28: qty 5

## 2023-01-28 MED ORDER — CEFAZOLIN SODIUM-DEXTROSE 2-4 GM/100ML-% IV SOLN
2.0000 g | INTRAVENOUS | Status: AC
  Administered 2023-01-28: 2 g via INTRAVENOUS
  Filled 2023-01-28: qty 100

## 2023-01-28 MED ORDER — ONDANSETRON HCL 4 MG PO TABS: 4.0000 mg | ORAL_TABLET | Freq: Four times a day (QID) | ORAL | Status: DC | PRN

## 2023-01-28 MED ORDER — METOCLOPRAMIDE HCL 5 MG/ML IJ SOLN: 5.0000 mg | Freq: Three times a day (TID) | INTRAMUSCULAR | Status: DC | PRN

## 2023-01-28 MED ORDER — HYDROCODONE-ACETAMINOPHEN 5-325 MG PO TABS: 1.0000 | ORAL_TABLET | Freq: Four times a day (QID) | ORAL | Status: DC | PRN

## 2023-01-28 MED ORDER — ONDANSETRON HCL 4 MG/2ML IJ SOLN
INTRAMUSCULAR | Status: DC | PRN
  Administered 2023-01-28: 4 mg via INTRAVENOUS

## 2023-01-28 MED ORDER — CEFAZOLIN SODIUM-DEXTROSE 2-4 GM/100ML-% IV SOLN
2.0000 g | Freq: Four times a day (QID) | INTRAVENOUS | Status: AC
  Administered 2023-01-28 – 2023-01-29 (×2): 2 g via INTRAVENOUS
  Filled 2023-01-28 (×2): qty 100

## 2023-01-28 MED ORDER — PROPOFOL 10 MG/ML IV BOLUS
INTRAVENOUS | Status: DC | PRN
  Administered 2023-01-28: 60 mg via INTRAVENOUS
  Administered 2023-01-28: 20 mg via INTRAVENOUS

## 2023-01-28 MED ORDER — CHLORHEXIDINE GLUCONATE 0.12 % MT SOLN
OROMUCOSAL | Status: AC
  Administered 2023-01-28: 15 mL via OROMUCOSAL
  Filled 2023-01-28: qty 15

## 2023-01-28 MED ORDER — ACETAMINOPHEN 10 MG/ML IV SOLN
INTRAVENOUS | Status: DC | PRN
Start: 2023-01-28 — End: 2023-01-28
  Administered 2023-01-28: 1000 mg via INTRAVENOUS

## 2023-01-28 MED ORDER — ONDANSETRON HCL 4 MG/2ML IJ SOLN
4.0000 mg | Freq: Four times a day (QID) | INTRAMUSCULAR | Status: DC | PRN
  Administered 2023-01-29: 4 mg via INTRAVENOUS
  Filled 2023-01-28: qty 2

## 2023-01-28 MED ORDER — HYDRALAZINE HCL 20 MG/ML IJ SOLN: 10.0000 mg | INTRAMUSCULAR | Status: DC | PRN

## 2023-01-28 MED ORDER — HYDROCODONE-ACETAMINOPHEN 5-325 MG PO TABS: 1.0000 | ORAL_TABLET | ORAL | Status: DC | PRN

## 2023-01-28 MED ORDER — CHLORHEXIDINE GLUCONATE 0.12 % MT SOLN: 15.0000 mL | Freq: Once | OROMUCOSAL | Status: AC

## 2023-01-28 MED ORDER — TRANEXAMIC ACID-NACL 1000-0.7 MG/100ML-% IV SOLN
1000.0000 mg | Freq: Once | INTRAVENOUS | Status: AC
  Administered 2023-01-28: 1000 mg via INTRAVENOUS
  Filled 2023-01-28: qty 100

## 2023-01-28 MED ORDER — METHOCARBAMOL 1000 MG/10ML IJ SOLN: 500.0000 mg | Freq: Four times a day (QID) | INTRAVENOUS | Status: DC | PRN

## 2023-01-28 MED ORDER — ROCURONIUM BROMIDE 10 MG/ML (PF) SYRINGE
PREFILLED_SYRINGE | INTRAVENOUS | Status: DC | PRN
  Administered 2023-01-28: 40 mg via INTRAVENOUS

## 2023-01-28 MED ORDER — CHLORHEXIDINE GLUCONATE 4 % EX SOLN: 60.0000 mL | Freq: Once | CUTANEOUS | Status: DC

## 2023-01-28 MED ORDER — LACTATED RINGERS IV SOLN: INTRAVENOUS | Status: DC

## 2023-01-28 MED ORDER — ONDANSETRON HCL 4 MG/2ML IJ SOLN
INTRAMUSCULAR | Status: AC
  Filled 2023-01-28: qty 2

## 2023-01-28 MED ORDER — SENNOSIDES-DOCUSATE SODIUM 8.6-50 MG PO TABS: 1.0000 | ORAL_TABLET | Freq: Every evening | ORAL | Status: DC | PRN

## 2023-01-28 MED ORDER — SODIUM CHLORIDE 0.9 % IV SOLN: INTRAVENOUS | Status: AC

## 2023-01-28 MED ORDER — HYDROMORPHONE HCL 1 MG/ML IJ SOLN
0.5000 mg | INTRAMUSCULAR | Status: DC | PRN
  Administered 2023-01-28 – 2023-01-29 (×2): 0.5 mg via INTRAVENOUS
  Filled 2023-01-28 (×3): qty 0.5

## 2023-01-28 MED ORDER — FENTANYL CITRATE (PF) 100 MCG/2ML IJ SOLN
INTRAMUSCULAR | Status: AC
  Filled 2023-01-28: qty 2

## 2023-01-28 MED ORDER — ACETAMINOPHEN 10 MG/ML IV SOLN
INTRAVENOUS | Status: AC
  Filled 2023-01-28: qty 100

## 2023-01-28 MED ORDER — ACETAMINOPHEN 325 MG PO TABS: 325.0000 mg | ORAL_TABLET | Freq: Four times a day (QID) | ORAL | Status: DC | PRN

## 2023-01-28 MED ORDER — BISACODYL 5 MG PO TBEC: 5.0000 mg | DELAYED_RELEASE_TABLET | Freq: Every day | ORAL | Status: DC | PRN

## 2023-01-28 SURGICAL SUPPLY — 33 items
ALCOHOL 70% 16 OZ (MISCELLANEOUS) ×1 IMPLANT
BAG COUNTER SPONGE SURGICOUNT (BAG) ×1 IMPLANT
BAG SPNG CNTER NS LX DISP (BAG) ×1
BIT DRILL 4.0X280 (BIT) IMPLANT
CLSR STERI-STRIP ANTIMIC 1/2X4 (GAUZE/BANDAGES/DRESSINGS) IMPLANT
COVER PERINEAL POST (MISCELLANEOUS) ×1 IMPLANT
COVER SURGICAL LIGHT HANDLE (MISCELLANEOUS) ×1 IMPLANT
DRAPE C-ARM 42X72 X-RAY (DRAPES) ×1 IMPLANT
DRAPE STERI IOBAN 125X83 (DRAPES) ×1 IMPLANT
DRSG AQUACEL AG ADV 3.5X 4 (GAUZE/BANDAGES/DRESSINGS) IMPLANT
DRSG AQUACEL AG ADV 3.5X 6 (GAUZE/BANDAGES/DRESSINGS) IMPLANT
DRSG MEPILEX SACRM 8.7X9.8 (GAUZE/BANDAGES/DRESSINGS) IMPLANT
DURAPREP 26ML APPLICATOR (WOUND CARE) ×1 IMPLANT
GLOVE BIO SURGEON STRL SZ7.5 (GLOVE) ×2 IMPLANT
GLOVE BIOGEL PI IND STRL 7.5 (GLOVE) IMPLANT
GLOVE BIOGEL PI IND STRL 8 (GLOVE) ×2 IMPLANT
GOWN STRL REUS W/ TWL LRG LVL3 (GOWN DISPOSABLE) ×1 IMPLANT
GOWN STRL REUS W/ TWL XL LVL3 (GOWN DISPOSABLE) ×2 IMPLANT
GOWN STRL REUS W/TWL LRG LVL3 (GOWN DISPOSABLE) ×1
GOWN STRL REUS W/TWL XL LVL3 (GOWN DISPOSABLE) ×2
KIT BASIN OR (CUSTOM PROCEDURE TRAY) ×1 IMPLANT
KIT TURNOVER KIT B (KITS) ×1 IMPLANT
NAIL TROCH SHORT 10X20 125D (Nail) IMPLANT
NS IRRIG 1000ML POUR BTL (IV SOLUTION) ×1 IMPLANT
PACK GENERAL/GYN (CUSTOM PROCEDURE TRAY) ×1 IMPLANT
PAD ARMBOARD 7.5X6 YLW CONV (MISCELLANEOUS) ×2 IMPLANT
PIN GUIDE THRD AR 3.2X330 (PIN) IMPLANT
SCREW CORT CAPTR 5X34 (Screw) IMPLANT
SCREW TELESCOP LAG 10.5X100 (Screw) IMPLANT
SUT MON AB 2-0 CT1 36 (SUTURE) ×1 IMPLANT
TOOL ACTIVATION (INSTRUMENTS) IMPLANT
TOWEL GREEN STERILE (TOWEL DISPOSABLE) ×1 IMPLANT
WATER STERILE IRR 1000ML POUR (IV SOLUTION) ×1 IMPLANT

## 2023-01-28 NOTE — Consult Note (Signed)
Reason for Consult:Right hip fx Referring Physician: Carmell Austria Time called: 1438 Time at bedside: 1459   Steven Horton is an 89 y.o. male.  HPI: Byrd was walking in his house and tripped over a threshold and fell. He had immediate right leg pain and could not get up. He was brought to the ED where x-rays showed a right hip fx and orthopedic surgery was consulted. He lives at home alone and ambulates with a cane.  Past Medical History:  Diagnosis Date   Arthritis    DVT (deep venous thrombosis) ([facility])    HOH (hard of hearing)    Hypertension    Stomach ulcer     Past Surgical History:  Procedure Laterality Date   JOINT REPLACEMENT     lumps removed     right arm, back,    REPLACEMENT TOTAL KNEE BILATERAL     SHOULDER SURGERY     multiple right   TOTAL KNEE REVISION Left 01/15/2017   Procedure: TOTAL KNEE REVISION;  Surgeon: Gean Birchwood, MD;  Location: [facility] OR;  Service: Orthopedics;  Laterality: Left;    Family History  Problem Relation Age of Onset   Cancer Sister     Social History:  reports that he quit smoking about 44 years ago. His smoking use included cigarettes. He has never used smokeless tobacco. He reports current alcohol use of about 4.0 standard drinks of alcohol per week. He reports that he does not use drugs.  Allergies: No Known Allergies  Medications: I have reviewed the patient's current medications.  No results found for this or any previous visit (from the past 48 hour(s)).  No results found.  Review of Systems  HENT:  Negative for ear discharge, ear pain, hearing loss and tinnitus.   Eyes:  Negative for photophobia and pain.  Respiratory:  Negative for cough and shortness of breath.   Cardiovascular:  Negative for chest pain.  Gastrointestinal:  Negative for abdominal pain, nausea and vomiting.  Genitourinary:  Negative for dysuria, flank pain, frequency and urgency.  Musculoskeletal:  Positive for arthralgias (Right hip/knee). Negative  for back pain, myalgias and neck pain.  Neurological:  Negative for dizziness and headaches.  Hematological:  Does not bruise/bleed easily.  Psychiatric/Behavioral:  The patient is not nervous/anxious.    Blood pressure (!) 190/77, pulse 71, temperature 97.9 F (36.6 C), temperature source Oral, resp. rate (!) 26, SpO2 99%. Physical Exam Constitutional:      General: He is not in acute distress.    Appearance: He is well-developed. He is not diaphoretic.  HENT:     Head: Normocephalic and atraumatic.  Eyes:     General: No scleral icterus.       Right eye: No discharge.        Left eye: No discharge.     Conjunctiva/sclera: Conjunctivae normal.  Cardiovascular:     Rate and Rhythm: Normal rate and regular rhythm.  Pulmonary:     Effort: Pulmonary effort is normal. No respiratory distress.  Musculoskeletal:     Cervical back: Normal range of motion.     Comments: RLE No traumatic wounds, ecchymosis, or rash  Mod TTP hip/knee  No knee or ankle effusion  Knee stable to varus/ valgus and anterior/posterior stress  Sens DPN, SPN, TN intact  Motor EHL, ext, flex, evers 5/5  DP 2+, PT 2+, No significant edema  Skin:    General: Skin is warm and dry.  Neurological:     Mental Status: He is  alert.  Psychiatric:        Mood and Affect: Mood normal.        Behavior: Behavior normal.     Assessment/Plan: Right hip fx -- Will try to get IMN done tonight as long as medicine can clear. Please keep NPO.    Freeman Caldron, PA-C Orthopedic Surgery [phone] 01/28/2023, 3:06 PM

## 2023-01-28 NOTE — ED Notes (Signed)
[name], the niece and POA, was updated via phone on the patients condition and plan of care with consent from the patient.

## 2023-01-28 NOTE — ED Provider Notes (Signed)
[place] EMERGENCY DEPARTMENT AT [facility] Provider Note   CSN: [id number] Arrival date & time: 01/28/23  1311     History  Chief Complaint  Patient presents with   Steven Horton is a 89 y.o. male.   Fall  Patient with fall.  Reportedly pain in right hip and right knee.  Did not hit head.  States mechanical fall.  Has had previous right knee replacement by Dr. Turner Daniels    Past Medical History:  Diagnosis Date   Arthritis    DVT (deep venous thrombosis) ([facility])    HOH (hard of hearing)    Hypertension    Stomach ulcer    Past Surgical History:  Procedure Laterality Date   JOINT REPLACEMENT     lumps removed     right arm, back,    REPLACEMENT TOTAL KNEE BILATERAL     SHOULDER SURGERY     multiple right   TOTAL KNEE REVISION Left 01/15/2017   Procedure: TOTAL KNEE REVISION;  Surgeon: Gean Birchwood, MD;  Location: [facility] OR;  Service: Orthopedics;  Laterality: Left;     Home Medications Prior to Admission medications   Medication Sig Start Date End Date Taking? Authorizing Provider  acetaminophen (TYLENOL) 325 MG tablet Take 650 mg by mouth every 6 (six) hours as needed for mild pain or moderate pain. 05/20/21   [provider]  amLODipine (NORVASC) 2.5 MG tablet Take 2.5 mg by mouth daily.    [provider]  apixaban (ELIQUIS) 2.5 MG TABS tablet Take 1 tablet (2.5 mg total) by mouth 2 (two) times daily. 01/18/17   Allena Katz, PA-C  aspirin EC 81 MG tablet Take 81 mg by mouth daily. 08/01/14   [provider]  docusate sodium (COLACE) 100 MG capsule Take 100 mg by mouth every evening.    [provider]  Emollient (DERMEND BRUISE FORMULA EX) Apply 1 application topically as needed (bruising).    [provider]  fenofibrate 160 MG tablet Take 160 mg by mouth daily.      [provider]  HYDROcodone-acetaminophen (NORCO/VICODIN) 5-325 MG tablet Take 1 tablet by mouth every 4 (four) hours as needed.  01/15/17   Allena Katz, PA-C  Multiple Vitamins-Minerals (CENTRUM SILVER 50+MEN PO) Take 1 tablet by mouth daily.    [provider]  Multiple Vitamins-Minerals (MULTIVITAMINS THER. W/MINERALS) TABS Take 1 tablet by mouth daily.      [provider]  sodium bicarbonate 650 MG tablet Take 650 mg by mouth 3 (three) times daily. 05/19/22   [provider]  SYNTHROID 75 MCG tablet Take 75 mcg by mouth daily before breakfast. 05/21/20   [provider]  tamsulosin (FLOMAX) 0.4 MG CAPS capsule Take 0.4 mg by mouth every evening.  12/14/16   [provider]  tiZANidine (ZANAFLEX) 2 MG tablet Take 1 tablet (2 mg total) by mouth every 6 (six) hours as needed for muscle spasms. 01/15/17   Allena Katz, PA-C  traMADol (ULTRAM) 50 MG tablet Take 50 mg by mouth 2 (two) times daily. Maximum dose= 8 tablets per day.  For pain.     [provider]  valsartan (DIOVAN) 160 MG tablet Take 320 mg by mouth daily. 05/19/22   [provider]  verapamil (CALAN-SR) 240 MG CR tablet Take 240 mg by mouth 2 (two) times daily.  01/02/13   [provider]  vitamin C (ASCORBIC ACID) 500 MG tablet Take 500 mg  by mouth daily.    [provider]      Allergies    Patient has no known allergies.    Review of Systems   Review of Systems  Physical Exam Updated Vital Signs BP (!) 190/77   Pulse 71   Temp 97.9 F (36.6 C) (Oral)   Resp (!) 26   SpO2 99%  Physical Exam Vitals and nursing note reviewed.  HENT:     Head: Atraumatic.  Cardiovascular:     Rate and Rhythm: Regular rhythm.  Chest:     Chest wall: No tenderness.  Abdominal:     Tenderness: There is no abdominal tenderness.  Musculoskeletal:        General: Tenderness present.     Cervical back: Neck supple. No tenderness.     Comments: Some tenderness to right knee.  Scar from previous ablation.  Also some tenderness to right hip anteriorly.  No large deformity.   Neurological:     Mental Status: He is alert and oriented to person, place, and time.     ED Results / Procedures / Treatments   Labs (all labs ordered are listed, but only abnormal results are displayed) Labs Reviewed  BASIC METABOLIC PANEL  CBC WITH DIFFERENTIAL/PLATELET  PROTIME-INR  TYPE AND SCREEN    EKG None  Radiology No results found.  Procedures Procedures    Medications Ordered in ED Medications  0.9 %  sodium chloride infusion (has no administration in time range)  fentaNYL (SUBLIMAZE) injection 50 mcg (has no administration in time range)    ED Course/ Medical Decision Making/ A&P                                 Medical Decision Making Amount and/or Complexity of Data Reviewed Labs: ordered. Radiology: ordered.  Risk Prescription drug management. Decision regarding hospitalization.   Patient presents with fall.  Right hip and right knee pain.  Denies hitting head.  Appears to be on anticoagulation.  Differential diagnosis includes knee and hip injuries.  Will get x-rays to evaluate.  Benign abdominal and chest examination.  X-ray shows intertrochanteric hip fracture on the right.  Discussed with Earney Hamburg from Ortho surgery.  Will be admitted.  Likely OR potential even today.  Will now get blood work and x-ray.  Since possibility of OR today will discuss with unassigned medicine to help expedite process.  Discussed with Dr. Allena Katz, who will admit patient.        Final Clinical Impression(s) / ED Diagnoses Final diagnoses:  Fall, initial encounter  Closed nondisplaced intertrochanteric fracture of right femur, initial encounter [facility])    Rx / DC Orders ED Discharge Orders     None         Benjiman Core, MD 01/28/23 1539

## 2023-01-28 NOTE — H&P (Signed)
History and Physical    Steven Horton ZOX:[id number] DOB: [birth year] DOA: 01/28/2023  PCP: [facility], Steven [facility]  Patient coming from: Home  I have personally briefly reviewed patient's old medical records in [facility]  Chief Complaint: Right leg pain after fall at home  HPI: Steven Horton is a 89 y.o. male with medical history significant for HTN, history of DVT (unclear if still on Eliquis) hypothyroidism, CKD stage IIIb who presented to the ED for evaluation of right hip and knee pain after a fall at home.  Patient lives alone, ambulates with use of cane and walker at baseline.  He says he tripped and fell to the ground.  He had immediate right leg pain and could not get up.  He says he did not hit his head or lose consciousness.  EMS were called and he was brought to the ED for further evaluation.  He denies chest pain, dyspnea, abdominal pain.  He is also complaining of right knee pain.  He reports history of bilateral knee replacement.  ED Course  Labs/Imaging on admission: I have personally reviewed following labs and imaging studies.  Initial vitals showed BP 190/77, pulse 72, RR 26, temp 97.9 F, SpO2 99% on room air.  Labs show WBC 6.7, hemoglobin 15.2, platelets 146,000, sodium 141, potassium 4.2, bicarb 19, BUN 28, creatinine 1.60, serum glucose 102.  Right pelvis x-ray showed comminuted and displaced intertrochanteric femur fracture.  Right knee x-ray showed right knee arthroplasty without complication or acute fracture.  Small joint effusion noted.  Portable chest x-ray showed low lung volumes with bronchovascular crowding versus vascular congestion.  Patient was given IV fentanyl 50 mcg.  Orthopedics were consulted and recommended medical admission with plan for surgical fixation, potentially tonight.  The hospitalist service was consulted to admit for further evaluation and management.  Review of Systems: All systems reviewed and are negative except as  documented in history of present illness above.   Past Medical History:  Diagnosis Date   Arthritis    DVT (deep venous thrombosis) (HCC)    HOH (hard of hearing)    Hypertension    Stomach ulcer     Past Surgical History:  Procedure Laterality Date   JOINT REPLACEMENT     lumps removed     right arm, back,    REPLACEMENT TOTAL KNEE BILATERAL     SHOULDER SURGERY     multiple right   TOTAL KNEE REVISION Left 01/15/2017   Procedure: TOTAL KNEE REVISION;  Surgeon: Gean Birchwood, MD;  Location: [facility] OR;  Service: Orthopedics;  Laterality: Left;    Social History:  reports that he quit smoking about 44 years ago. His smoking use included cigarettes. He has never used smokeless tobacco. He reports current alcohol use of about 4.0 standard drinks of alcohol per week. He reports that he does not use drugs.  Allergies  Allergen Reactions   Prednisone Other (See Comments)    Feeling agitated    Family History  Problem Relation Age of Onset   Cancer Sister      Prior to Admission medications   Medication Sig Start Date End Date Taking? Authorizing Provider  acetaminophen (TYLENOL) 325 MG tablet Take 650 mg by mouth every 6 (six) hours as needed for mild pain or moderate pain. 05/20/21   [provider]  amLODipine (NORVASC) 2.5 MG tablet Take 2.5 mg by mouth daily.    [provider]  apixaban (ELIQUIS) 2.5 MG TABS tablet Take 1 tablet (2.5  mg total) by mouth 2 (two) times daily. 01/18/17   Allena Katz, PA-C  aspirin EC 81 MG tablet Take 81 mg by mouth daily. 08/01/14   [provider]  docusate sodium (COLACE) 100 MG capsule Take 100 mg by mouth every evening.    [provider]  Emollient (DERMEND BRUISE FORMULA EX) Apply 1 application topically as needed (bruising).    [provider]  fenofibrate 160 MG tablet Take 160 mg by mouth daily.      [provider]  HYDROcodone-acetaminophen (NORCO/VICODIN) 5-325 MG tablet Take 1  tablet by mouth every 4 (four) hours as needed. 01/15/17   Allena Katz, PA-C  Multiple Vitamins-Minerals (CENTRUM SILVER 50+MEN PO) Take 1 tablet by mouth daily.    [provider]  Multiple Vitamins-Minerals (MULTIVITAMINS THER. W/MINERALS) TABS Take 1 tablet by mouth daily.      [provider]  sodium bicarbonate 650 MG tablet Take 650 mg by mouth 3 (three) times daily. 05/19/22   [provider]  SYNTHROID 75 MCG tablet Take 75 mcg by mouth daily before breakfast. 05/21/20   [provider]  tamsulosin (FLOMAX) 0.4 MG CAPS capsule Take 0.4 mg by mouth every evening.  12/14/16   [provider]  tiZANidine (ZANAFLEX) 2 MG tablet Take 1 tablet (2 mg total) by mouth every 6 (six) hours as needed for muscle spasms. 01/15/17   Allena Katz, PA-C  traMADol (ULTRAM) 50 MG tablet Take 50 mg by mouth 2 (two) times daily. Maximum dose= 8 tablets per day.  For pain.     [provider]  valsartan (DIOVAN) 160 MG tablet Take 320 mg by mouth daily. 05/19/22   [provider]  verapamil (CALAN-SR) 240 MG CR tablet Take 240 mg by mouth 2 (two) times daily.  01/02/13   [provider]  vitamin C (ASCORBIC ACID) 500 MG tablet Take 500 mg by mouth daily.    [provider]    Physical Exam: Vitals:   01/28/23 1326 01/28/23 1327 01/28/23 1605 01/28/23 1630  BP: (!) 190/77  (!) 214/178 (!) 175/71  Pulse: 71  83 80  Resp: (!) 26  20 (!) 22  Temp:  97.9 F (36.6 C) 97.8 F (36.6 C)   TempSrc:  Oral Oral   SpO2: 99%  100% 99%   Constitutional: Elderly man resting supine in bed.  NAD, calm, comfortable Eyes: EOMI, lids and conjunctivae normal ENMT: Mucous membranes are dry. Posterior pharynx clear of any exudate or lesions.Normal dentition.  Neck: normal, supple, no masses. Respiratory: clear to auscultation anteriorly. Normal respiratory effort. No accessory muscle use.  Cardiovascular: Regular rate and rhythm, no murmurs /  rubs / gallops. No extremity edema. 2+ pedal pulses. Abdomen: no tenderness, no masses palpated.  Musculoskeletal: RLE shortened and right foot everted.  ROM diminished lower extremities due to right hip fracture.  Intact upper extremities. Skin: no rashes, lesions, ulcers. No induration Neurologic: Sensation intact. Strength 5/5 bilateral upper extremities.  Diminished lower extremities due to right hip fracture. Psychiatric: Alert and oriented x 3. Normal mood.   EKG: Personally reviewed. Sinus rhythm, rate 73, no acute ischemic changes.  Similar to prior.  Assessment/Plan Principal Problem:   Closed intertrochanteric fracture of hip, right, initial encounter (HCC) Active Problems:   History of DVT (deep vein thrombosis)   Chronic kidney disease, stage 3b (HCC)   Steven Horton is a 89 y.o. male with medical history significant for HTN, history of DVT (  unclear if still on Eliquis) hypothyroidism, CKD stage IIIb who is admitted for right hip fracture after mechanical fall.  Orthopedics consulted and planning on IM nail 10/3 pm.  Assessment and Plan: Comminuted and displaced intertrochanteric right femur fracture: Occurring after mechanical fall at home.  Patient denies chest pain.  No known prior cardiac history.  Patient is considered low risk surgical candidate from medical perspective. -Keep n.p.o. -Orthopedics following, potential IM nail tonight -Continue analgesics as ordered  History of DVT: Unclear if he is still on Eliquis, anticoagulation will be held regardless pending surgical intervention.  CKD stage IIIb: Creatinine 1.60 on admission, relatively stable.  Continue to monitor.  Hypertension: Med rec pending.  IV hydralazine as needed.  Hypothyroidism: Med rec pending.   DVT prophylaxis: SCDs Start: 01/28/23 1635 Code Status: Full code, confirmed with patient on admission and niece Rodolph Bong who is POA Family Communication: Discussed with niece [name] by  phone Disposition Plan: From home, dispo pending clinical progress Consults called: Orthopedics Severity of Illness: The appropriate patient status for this patient is INPATIENT. Inpatient status is judged to be reasonable and necessary in order to provide the required intensity of service to ensure the patient's safety. The patient's presenting symptoms, physical exam findings, and initial radiographic and laboratory data in the context of their chronic comorbidities is felt to place them at high risk for further clinical deterioration. Furthermore, it is not anticipated that the patient will be medically stable for discharge from the hospital within 2 midnights of admission.   * I certify that at the point of admission it is my clinical judgment that the patient will require inpatient hospital care spanning beyond 2 midnights from the point of admission due to high intensity of service, high risk for further deterioration and high frequency of surveillance required.Darreld Mclean MD [facility]  If 7PM-7AM, please contact night-coverage [email or web address]  01/28/2023, 4:52 PM

## 2023-01-28 NOTE — Anesthesia Procedure Notes (Signed)
Procedure Name: Intubation Date/Time: 01/28/2023 5:48 PM  Performed by: Eulah Pont, CRNAPre-anesthesia Checklist: Patient identified, Emergency Drugs available, Suction available and Patient being monitored Patient Re-evaluated:Patient Re-evaluated prior to induction Oxygen Delivery Method: Circle System Utilized Preoxygenation: Pre-oxygenation with 100% oxygen Induction Type: IV induction, Rapid sequence and Cricoid Pressure applied Ventilation: Mask ventilation without difficulty Laryngoscope Size: Miller and 2 Grade View: Grade II Tube type: Oral Tube size: 7.0 mm Number of attempts: 1 Airway Equipment and Method: Stylet and Oral airway Placement Confirmation: ETT inserted through vocal cords under direct vision, positive ETCO2 and breath sounds checked- equal and bilateral Secured at: 23 cm Tube secured with: Tape Dental Injury: Teeth and Oropharynx as per pre-operative assessment

## 2023-01-28 NOTE — Anesthesia Preprocedure Evaluation (Signed)
Anesthesia Evaluation  Patient identified by MRN, date of birth, ID band Patient awake    Reviewed: Allergy & Precautions, NPO status , Patient's Chart, lab work & pertinent test results  Airway Mallampati: II  TM Distance: >3 FB Neck ROM: Full    Dental  (+) Dental Advisory Given, Poor Dentition, Missing   Pulmonary former smoker   Pulmonary exam normal breath sounds clear to auscultation       Cardiovascular hypertension, Pt. on medications Normal cardiovascular exam Rhythm:Regular Rate:Normal     Neuro/Psych negative neurological ROS     GI/Hepatic Neg liver ROS, PUD,,,  Endo/Other  negative endocrine ROS    Renal/GU Renal InsufficiencyRenal disease     Musculoskeletal  (+) Arthritis ,  Right hip fracture   Abdominal   Peds  Hematology  (+) Blood dyscrasia (Eliquis; thrombocytopenia)   Anesthesia Other Findings Day of surgery medications reviewed with the patient.  Reproductive/Obstetrics                             Anesthesia Physical Anesthesia Plan  ASA: 3 and emergent  Anesthesia Plan: General   Post-op Pain Management: Ofirmev IV (intra-op)*   Induction: Intravenous, Rapid sequence and Cricoid pressure planned  PONV Risk Score and Plan: 2 and Dexamethasone and Ondansetron  Airway Management Planned: Oral ETT  Additional Equipment:   Intra-op Plan:   Post-operative Plan: Extubation in OR  Informed Consent: I have reviewed the patients History and Physical, chart, labs and discussed the procedure including the risks, benefits and alternatives for the proposed anesthesia with the patient or authorized representative who has indicated his/her understanding and acceptance.     Dental advisory given  Plan Discussed with: CRNA  Anesthesia Plan Comments:        Anesthesia Quick Evaluation

## 2023-01-28 NOTE — Transfer of Care (Signed)
Immediate Anesthesia Transfer of Care Note  Patient: Steven Horton  Procedure(s) Performed: INTRAMEDULLARY (IM) NAIL INTERTROCHANTERIC (Right)  Patient Location: PACU  Anesthesia Type:General  Level of Consciousness: awake and drowsy  Airway & Oxygen Therapy: Patient Spontanous Breathing and Patient connected to face mask oxygen  Post-op Assessment: Report given to RN, Post -op Vital signs reviewed and stable, and Patient moving all extremities  Post vital signs: Reviewed and stable  Last Vitals:  Vitals Value Taken Time  BP 130/62 01/28/23 1902  Temp    Pulse 69 01/28/23 1904  Resp 25 01/28/23 1904  SpO2 99 % 01/28/23 1904  Vitals shown include unfiled device data.  Last Pain:  Vitals:   01/28/23 1615  TempSrc:   PainSc: 5          Complications: No notable events documented.

## 2023-01-28 NOTE — Discharge Instructions (Signed)
Orthopedic surgery discharge instructions:  -Okay for full weightbearing as tolerated to the right lower extremity.  -Maintain postoperative bandage for 2 weeks.  You may remove it 2 weeks or have [place] remove this in the office at your first follow-up appointment.  -Okay to shower starting immediately.  Please do not submerge underwater.  -Apply ice liberally to the right hip throughout the day.  Should also take Tylenol as needed around-the-clock and utilize the hydrocodone as necessary for breakthrough pain.  -Continue along with your preoperative Eliquis after surgery.  -Follow-up with Dr. Aundria Rud in the office in 2 weeks.

## 2023-01-28 NOTE — Brief Op Note (Signed)
01/28/2023  6:35 PM  PATIENT:  Steven Horton  89 y.o. male  PRE-OPERATIVE DIAGNOSIS:  Right hip fracture  POST-OPERATIVE DIAGNOSIS:  * No post-op diagnosis entered *  PROCEDURE:  Procedure(s): INTRAMEDULLARY (IM) NAIL INTERTROCHANTERIC (Right)  SURGEON:  Surgeons and Role:    * Yolonda Kida, MD - Primary  PHYSICIAN ASSISTANT: Dion Saucier, PA-C   ANESTHESIA:   local and general  EBL: 100 cc  BLOOD ADMINISTERED:none  DRAINS: none   LOCAL MEDICATIONS USED:  MARCAINE     SPECIMEN:  No Specimen  DISPOSITION OF SPECIMEN:  N/A  COUNTS:  YES  TOURNIQUET:  * No tourniquets in log *  DICTATION: .Note written in EPIC  PLAN OF CARE: Admit to inpatient   PATIENT DISPOSITION:  PACU - hemodynamically stable.   Delay start of Pharmacological VTE agent (>24hrs) due to surgical blood loss or risk of bleeding: not applicable

## 2023-01-28 NOTE — ED Notes (Signed)
ED TO INPATIENT HANDOFF REPORT  ED Nurse Name and Phone #: Yaritza Leist, RN [phone]  S Name/Age/Gender Steven Horton 89 y.o. male Room/Bed: 009C/009C  Code Status   Code Status: Prior  Home/SNF/Other Home Patient oriented to: self, place, time, and situation Is this baseline? Yes   Triage Complete: Triage complete  Chief Complaint fall  Triage Note Patient BIB [facility] from home for mechanical fall with no LOC or thinners. Patient's only c/o right knee pain and inability to bend knee, EMS reports and RN notes shortening and rotation to right leg but no hip pain. Patient A&Ox4, VSS except HTN 180/80.   Allergies No Known Allergies  Level of Care/Admitting Diagnosis ED Disposition     ED Disposition  Admit   Condition  --   Comment  The patient appears reasonably stabilized for admission considering the current resources, flow, and capabilities available in the ED at this time, and I doubt any other [facility] requiring further screening and/or treatment in the ED prior to admission is  present.          B Medical/Surgery History Past Medical History:  Diagnosis Date   Arthritis    DVT (deep venous thrombosis) (HCC)    HOH (hard of hearing)    Hypertension    Stomach ulcer    Past Surgical History:  Procedure Laterality Date   JOINT REPLACEMENT     lumps removed     right arm, back,    REPLACEMENT TOTAL KNEE BILATERAL     SHOULDER SURGERY     multiple right   TOTAL KNEE REVISION Left 01/15/2017   Procedure: TOTAL KNEE REVISION;  Surgeon: Gean Birchwood, MD;  Location: [facility] OR;  Service: Orthopedics;  Laterality: Left;     A IV Location/Drains/Wounds Patient Lines/Drains/Airways Status     Active Line/Drains/Airways     Name Placement date Placement time Site Days   Peripheral IV 01/28/23 20 G Anterior;Left Forearm 01/28/23  1323  Forearm  less than 1   Incision (Closed) 01/15/17 Leg Left 01/15/17  1141  -- 2204            Intake/Output Last 24 hours No intake  or output data in the 24 hours ending 01/28/23 1619  Labs/Imaging Results for orders placed or performed during the hospital encounter of 01/28/23 (from the past 48 hour(s))  CBC with Differential     Status: Abnormal   Collection Time: 01/28/23  2:40 PM  Result Value Ref Range   WBC 6.7 4.0 - 10.5 K/uL   RBC 4.61 4.22 - 5.81 MIL/uL   Hemoglobin 15.2 13.0 - 17.0 g/dL   HCT 40.3 47.4 - 25.9 %   MCV 96.3 80.0 - 100.0 fL   MCH 33.0 26.0 - 34.0 pg   MCHC 34.2 30.0 - 36.0 g/dL   RDW 56.3 87.5 - 64.3 %   Platelets 146 (L) 150 - 400 K/uL   nRBC 0.0 0.0 - 0.2 %   Neutrophils Relative % 70 %   Neutro Abs 4.7 1.7 - 7.7 K/uL   Lymphocytes Relative 18 %   Lymphs Abs 1.2 0.7 - 4.0 K/uL   Monocytes Relative 10 %   Monocytes Absolute 0.6 0.1 - 1.0 K/uL   Eosinophils Relative 1 %   Eosinophils Absolute 0.1 0.0 - 0.5 K/uL   Basophils Relative 1 %   Basophils Absolute 0.0 0.0 - 0.1 K/uL   Immature Granulocytes 0 %   Abs Immature Granulocytes 0.02 0.00 - 0.07 K/uL  Comment: Performed at [facility], [address], [place], [place] [address]  Type and screen [facility]     Status: None (Preliminary result)   Collection Time: 01/28/23  3:35 PM  Result Value Ref Range   ABO/RH(D) PENDING    Antibody Screen PENDING    Sample Expiration      01/31/2023,2359 Performed at [facility], [address] [place], [place] [address]    No results found.  Pending Labs Unresulted Labs (From admission, onward)     Start     Ordered   01/28/23 1440  Basic metabolic panel  Once,   STAT        01/28/23 1440   01/28/23 1440  Protime-INR  Once,   STAT        01/28/23 1440            Vitals/Pain Today's Vitals   01/28/23 1326 01/28/23 1327 01/28/23 1605 01/28/23 1615  BP: (!) 190/77  (!) 214/178   Pulse: 71  83   Resp: (!) 26  20   Temp:  97.9 F (36.6 C) 97.8 F (36.6 C)   TempSrc:  Oral Oral   SpO2: 99%  100%   PainSc:    5     Isolation  Precautions No active isolations  Medications Medications  0.9 %  sodium chloride infusion (has no administration in time range)  chlorhexidine (HIBICLENS) 4 % liquid 4 Application (has no administration in time range)  povidone-iodine 10 % swab 2 Application (has no administration in time range)  ceFAZolin (ANCEF) IVPB 2g/100 mL premix (has no administration in time range)  fentaNYL (SUBLIMAZE) injection 50 mcg (50 mcg Intravenous Given 01/28/23 1528)    Mobility walks     Focused Assessments    R Recommendations: See Admitting Provider Note  Report given to:   Additional Notes: Patient is A&Ox4 but very hard of hearing! He has been NPO since arrival and has a 20 in the left forearm. Patient only complains of right knee pain but his right hip is broken and he is having surgery. Family has been updated regarding this plan of care.

## 2023-01-28 NOTE — ED Triage Notes (Signed)
Patient BIB [facility] from home for mechanical fall with no LOC or thinners. Patient's only c/o right knee pain and inability to bend knee, EMS reports and RN notes shortening and rotation to right leg but no hip pain. Patient A&Ox4, VSS except HTN 180/80.

## 2023-01-28 NOTE — Progress Notes (Signed)
Pt is a little confused, oriented to person, place. I called his POA  [name] for the surgical consent. Agreed with surgery.

## 2023-01-28 NOTE — Hospital Course (Signed)
Steven Horton is a 89 y.o. male with medical history significant for HTN, history of DVT (unclear if still on Eliquis) hypothyroidism, CKD stage IIIb who is admitted for right hip fracture after mechanical fall.  Orthopedics consulted and planning on IM nail 10/3 pm.

## 2023-01-28 NOTE — Op Note (Signed)
Date of Surgery: 01/28/2023  INDICATIONS: Steven Horton is a 89 y.o.-year-old male who sustained a right hip fracture. The risks and benefits of the procedure discussed with the patient and family prior to the procedure and all questions were answered; consent was obtained.  PREOPERATIVE DIAGNOSIS: right hip fracture   POSTOPERATIVE DIAGNOSIS: Same   PROCEDURE: Treatment of intertrochanteric, pertrochanteric, subtrochanteric fracture with intramedullary implant. CPT [phone]   SURGEON: Steven Horton, M.D.   Assistant: Dion Saucier, PA-C  Assistant attestation:  PA Sharon Seller present for the entire procedure.  ANESTHESIA: general   IV FLUIDS AND URINE: See anesthesia record   ESTIMATED BLOOD LOSS: 100 cc  IMPLANTS: Arthrex intertrochanteric hip nail size 10 x 200 with a 105 mm compression screw and a 5 mm x 34 mm distal interlock  DRAINS: None.   COMPLICATIONS: None.   DESCRIPTION OF PROCEDURE: The patient was brought to the operating room and placed supine on the operating table. The patient's leg had been signed prior to the procedure. The patient had the anesthesia placed by the anesthesiologist. The prep verification and incision time-outs were performed to confirm that this was the correct patient, site, side and location. The patient had an SCD on the opposite lower extremity. The patient did receive antibiotics prior to the incision and was re-dosed during the procedure as needed at indicated intervals. The patient was positioned on the fracture table with the table in traction and internal rotation to reduce the hip. The well leg was placed in a scissor position and all bony prominences were well-padded. The patient had the lower extremity prepped and draped in the standard surgical fashion. The incision was made 4 finger breadths superior to the greater trochanter. A guide pin was inserted into the tip of the greater trochanter under fluoroscopic guidance. An opening reamer was  used to gain access to the femoral canal. The nail length was measured and inserted down the femoral canal to its proper depth. The appropriate version of insertion for the lag screw was found under fluoroscopy. A pin was inserted up the femoral neck through the jig. The length of the lag screw was then measured. The lag screw was inserted as near to center-center in the head as possible. The leg was taken out of traction, then the compression screw was used to compress across the fracture. Compression was visualized on serial xrays.   We next turned our attention to the distal interlocking screw.  This was placed through the drill guide of the nail inserter.  A small incision was made overlying the lateral thigh at the screw site, and a tonsil was used to disect down to bone.  A drill pass was made through the jig and across the nail through both cortices.  This was measured, and the appropriate screw was placed under hand power and found to have good bite.    The wound was copiously irrigated with saline and the subcutaneous layer closed with 2.0 Monocryl and the skin was reapproximated with staples. The wounds were cleaned and dried a final time and a sterile dressing was placed. The hip was taken through a range of motion at the end of the case under fluoroscopic imaging to visualize the approach-withdraw phenomenon and confirm implant length in the head. The patient was then awakened from anesthesia and taken to the recovery room in stable condition. All counts were correct at the end of the case.   POSTOPERATIVE PLAN: The patient will be weight bearing as  tolerated and will return in 2 weeks for staple removal and the patient will receive DVT prophylaxis based on other medications, activity level, and risk ratio of bleeding to thrombosis.  He will be admitted to the hospitalist service and can resume/continue with his Eliquis as preoperatively with no holiday of that.   Maryan Rued, MD [facility] [facility] [phone] 6:35 PM

## 2023-01-28 NOTE — Anesthesia Postprocedure Evaluation (Signed)
Anesthesia Post Note  Patient: Steven Horton  Procedure(s) Performed: INTRAMEDULLARY (IM) NAIL INTERTROCHANTERIC (Right)     Patient location during evaluation: PACU Anesthesia Type: General Level of consciousness: awake and alert, patient cooperative and oriented Pain management: pain level controlled Vital Signs Assessment: post-procedure vital signs reviewed and stable Respiratory status: spontaneous breathing, nonlabored ventilation and respiratory function stable Cardiovascular status: blood pressure returned to baseline and stable Postop Assessment: no apparent nausea or vomiting Anesthetic complications: no   No notable events documented.  Last Vitals:  Vitals:   01/28/23 1930 01/28/23 1945  BP: (!) 153/65 (!) 149/62  Pulse: 73 70  Resp: 20 19  Temp:  (!) 36.1 C  SpO2: 97% 99%    Last Pain:  Vitals:   01/28/23 1930  TempSrc:   PainSc: 3                  Nicco Reaume,E. Daved Mcfann

## 2023-01-29 ENCOUNTER — Encounter (HOSPITAL_COMMUNITY): Payer: Self-pay | Admitting: Orthopedic Surgery

## 2023-01-29 DIAGNOSIS — S72141A Displaced intertrochanteric fracture of right femur, initial encounter for closed fracture: Secondary | ICD-10-CM | POA: Diagnosis not present

## 2023-01-29 LAB — CBC
HCT: 37.7 % — ABNORMAL LOW (ref 39.0–52.0)
Hemoglobin: 12.8 g/dL — ABNORMAL LOW (ref 13.0–17.0)
MCH: 33.5 pg (ref 26.0–34.0)
MCHC: 34 g/dL (ref 30.0–36.0)
MCV: 98.7 fL (ref 80.0–100.0)
Platelets: 146 10*3/uL — ABNORMAL LOW (ref 150–400)
RBC: 3.82 MIL/uL — ABNORMAL LOW (ref 4.22–5.81)
RDW: 14 % (ref 11.5–15.5)
WBC: 8.4 10*3/uL (ref 4.0–10.5)
nRBC: 0 % (ref 0.0–0.2)

## 2023-01-29 LAB — BASIC METABOLIC PANEL
Anion gap: 11 (ref 5–15)
BUN: 27 mg/dL — ABNORMAL HIGH (ref 8–23)
CO2: 21 mmol/L — ABNORMAL LOW (ref 22–32)
Calcium: 8 mg/dL — ABNORMAL LOW (ref 8.9–10.3)
Chloride: 104 mmol/L (ref 98–111)
Creatinine, Ser: 1.63 mg/dL — ABNORMAL HIGH (ref 0.61–1.24)
GFR, Estimated: 40 mL/min — ABNORMAL LOW (ref >60)
Glucose, Bld: 147 mg/dL — ABNORMAL HIGH (ref 70–99)
Potassium: 4.2 mmol/L (ref 3.5–5.1)
Sodium: 136 mmol/L (ref 135–145)

## 2023-01-29 LAB — VITAMIN D 25 HYDROXY (VIT D DEFICIENCY, FRACTURES): Vit D, 25-Hydroxy: 27.01 ng/mL — ABNORMAL LOW (ref 30–100)

## 2023-01-29 MED ORDER — LEVOTHYROXINE SODIUM 75 MCG PO TABS
75.0000 ug | ORAL_TABLET | Freq: Every day | ORAL | Status: DC
  Administered 2023-01-29 – 2023-02-01 (×4): 75 ug via ORAL
  Filled 2023-01-29 (×4): qty 1

## 2023-01-29 MED ORDER — POLYETHYLENE GLYCOL 3350 17 G PO PACK
17.0000 g | PACK | Freq: Every day | ORAL | Status: DC
  Administered 2023-01-29 – 2023-02-01 (×4): 17 g via ORAL
  Filled 2023-01-29 (×4): qty 1

## 2023-01-29 MED ORDER — HYDROCODONE-ACETAMINOPHEN 5-325 MG PO TABS: 1.0000 | ORAL_TABLET | ORAL | 0 refills | Status: AC | PRN

## 2023-01-29 MED ORDER — APIXABAN 2.5 MG PO TABS
2.5000 mg | ORAL_TABLET | Freq: Two times a day (BID) | ORAL | Status: DC
  Administered 2023-01-29 – 2023-02-01 (×7): 2.5 mg via ORAL
  Filled 2023-01-29 (×7): qty 1

## 2023-01-29 MED ORDER — VITAMIN D (ERGOCALCIFEROL) 1.25 MG (50000 UNIT) PO CAPS
50000.0000 [IU] | ORAL_CAPSULE | ORAL | Status: DC
  Administered 2023-01-29: 50000 [IU] via ORAL
  Filled 2023-01-29: qty 1

## 2023-01-29 MED ORDER — TAMSULOSIN HCL 0.4 MG PO CAPS
0.4000 mg | ORAL_CAPSULE | Freq: Every evening | ORAL | Status: DC
  Administered 2023-01-29 – 2023-01-31 (×3): 0.4 mg via ORAL
  Filled 2023-01-29 (×3): qty 1

## 2023-01-29 MED ORDER — APIXABAN 2.5 MG PO TABS: 2.5000 mg | ORAL_TABLET | Freq: Two times a day (BID) | ORAL | 0 refills | Status: AC

## 2023-01-29 MED ORDER — SENNOSIDES-DOCUSATE SODIUM 8.6-50 MG PO TABS
1.0000 | ORAL_TABLET | Freq: Two times a day (BID) | ORAL | Status: DC
  Administered 2023-01-29 – 2023-02-01 (×7): 1 via ORAL
  Filled 2023-01-29 (×7): qty 1

## 2023-01-29 MED ORDER — AMLODIPINE BESYLATE 2.5 MG PO TABS
2.5000 mg | ORAL_TABLET | Freq: Every day | ORAL | Status: DC
  Administered 2023-01-29 – 2023-02-01 (×4): 2.5 mg via ORAL
  Filled 2023-01-29 (×4): qty 1

## 2023-01-29 NOTE — Progress Notes (Signed)
   Subjective:  Steven Horton is a 89 y.o. male, 1 Day Post-Op    s/p Procedure(s): INTRAMEDULLARY (IM) NAIL INTERTROCHANTERIC   Patient reports pain as mild.  confused today, wasn't aware that he broke his hip or had surgery when I saw him this morning. Denies numbness or tingling. Mild discomfort at his knee, hx of TKA.   Objective:   VITALS:   Vitals:   01/28/23 2339 01/29/23 0340 01/29/23 0754 01/29/23 0950  BP: 136/61 (!) 102/47 (!) 155/63 (!) 114/59  Pulse: 78 72 75   Resp: 18 18 16    Temp: 98.2 F (36.8 C)  98.4 F (36.9 C)   TempSrc: Oral Oral    SpO2: 98% 95% 96%   Height:        RLE:   Neurovascular intact Sensation intact distally Intact pulses distally Dorsiflexion/Plantar flexion intact Incision: dressing C/D/I No cellulitis present Compartment soft Clean aquacells x 3 lateral hip. Mild knee effusion. Able to wiggle toes, no calf pain.   Lab Results  Component Value Date   WBC 8.4 01/29/2023   HGB 12.8 (L) 01/29/2023   HCT 37.7 (L) 01/29/2023   MCV 98.7 01/29/2023   PLT 146 (L) 01/29/2023   BMET    Component Value Date/Time   NA 136 01/29/2023 0346   K 4.2 01/29/2023 0346   CL 104 01/29/2023 0346   CO2 21 (L) 01/29/2023 0346   GLUCOSE 147 (H) 01/29/2023 0346   BUN 27 (H) 01/29/2023 0346   CREATININE 1.63 (H) 01/29/2023 0346   CALCIUM 8.0 (L) 01/29/2023 0346   GFRNONAA 40 (L) 01/29/2023 0346     Assessment/Plan: 1 Day Post-Op   Principal Problem:   Closed intertrochanteric fracture of hip, right, initial encounter (HCC) Active Problems:   History of DVT (deep vein thrombosis)   Chronic kidney disease, stage 3b (HCC)   Advance diet Up with therapy   Aquacell dressings can be changed as needed if saturated otherwise maintain aquacell, ok to shower after day 3  S/p Hip nail  , right .   Dispo: PT recommending SNF, agree. Ok to discharge once medically stable and SNF arranged.   Weightbearing Status:  WBAT with walker DVT  Prophylaxis: Recommending eliquis   Follow up in the office at [facility] with me or Dr. Aundria Rud in 2 weeks.    Arbie Cookey 01/29/2023, 10:57 AM  Dion Saucier PA-C  Physician Assistant with Dr. Rebekah [place] [facility]

## 2023-01-29 NOTE — TOC Initial Note (Signed)
Transition of Care [facility]) - Initial/Assessment Note    Patient Details  Name: Steven Horton MRN: [id number] Date of Birth: 09/18/1930  Transition of [facility]) CM/SW Contact:    Lorri Frederick, LCSW Phone Number: 01/29/2023, 4:08 PM  Clinical Narrative:         CSW met with pt regarding DC recommendation for SNF.  Pt listed in epic with varying orientation, but mostly able to participate in conversation, was aware he was recommended for SNF and agreeable.  Medicare choice document provided.  Pt lives home alone, primary contact is niece [name], permission given to speak with her.  She is also POA.    CSW spoke with Charise Killian, she is in [place].  She is in agreement with SNF as well. CSW directed her to medicare website and she was able to pull up choices and ratings.           Referral sent out in hub for SNF.  Expected Discharge Plan: Skilled Nursing Facility Barriers to Discharge: Continued Medical Work up, SNF Pending bed offer   Patient Goals and CMS Choice   CMS Medicare.gov Compare Post Acute Care list provided to:: Patient Choice offered to / list presented to : [facility] POA / Guardian [name])      Expected Discharge Plan and Services In-house Referral: Clinical Social Work   Post Acute Care Choice: Skilled Nursing Facility Living arrangements for the past 2 months: Single Family Home                                      Prior Living Arrangements/Services Living arrangements for the past 2 months: Single Family Home Lives with:: Self Patient language and need for interpreter reviewed:: Yes Do you feel safe going back to the place where you live?: Yes      Need for Family Participation in Patient Care: Yes (Comment) Care giver support system in place?: Yes (comment) Current home services: Other (comment) (none) Criminal Activity/Legal Involvement Pertinent to Current Situation/Hospitalization: No - Comment as needed  Activities of Daily Living       Permission Sought/Granted Permission sought to share information with : Family Supports Permission granted to share information with : Yes, Verbal Permission Granted  Share Information with NAME: niece [name]  Permission granted to share info w AGENCY: SNF        Emotional Assessment Appearance:: Appears stated age Attitude/Demeanor/Rapport: Engaged Affect (typically observed): Appropriate, Pleasant Orientation: : Oriented to Self, Oriented to Place, Oriented to Situation      Admission diagnosis:  Closed nondisplaced intertrochanteric fracture of right femur, initial encounter (HCC) [S72.144A] Fall, initial encounter [W19.XXXA] Closed intertrochanteric fracture of hip, right, initial encounter [facility]) [S72.141A] Patient Active Problem List   Diagnosis Date Noted   Closed intertrochanteric fracture of hip, right, initial encounter (HCC) 01/28/2023   History of DVT (deep vein thrombosis) 01/28/2023   Chronic kidney disease, stage 3b (HCC) 01/28/2023   Primary osteoarthritis of left knee 01/15/2017   Failed total knee, left (HCC) 01/14/2017   Phlebitis and thrombophlebitis of other deep vessels of lower extremities 02/10/2013   Leg swelling 02/10/2013   Pain in joint, lower leg 02/10/2013   DVT (deep venous thrombosis) (HCC) 02/10/2013   Baker's cyst, ruptured 02/10/2013   PCP:  [facility] Pharmacy:   [facility] [id number] - [place], [place] - [address] AT [facility] [address]  [address] [place] [address] Phone: [phone] Fax: [phone]  [facility] - [place] - [address] [place] [address] Phone: [phone] Fax: [phone]     Social Determinants of Health (SDOH) Social History: SDOH Screenings   Tobacco Use: Medium Risk (01/28/2023)   SDOH Interventions:     Readmission Risk Interventions     No data to display

## 2023-01-29 NOTE — Evaluation (Signed)
Physical Therapy Evaluation Patient Details Name: Steven Horton MRN: [id number] DOB: 1931/01/05 Today's Date: 01/29/2023  History of Present Illness  Steven Horton is a 89 y.o. male with medical history significant for HTN, history of DVT (unclear if still on Eliquis) hypothyroidism, CKD stage IIIb who presented to the ED for evaluation of right hip and knee pain after a fall at home. Pt is now s/p IM nailing on 01/28/23  Clinical Impression  Pt presents with admitting diagnosis above. Pt today required +2 Max A for bed mobility and heavy Max A for stand pivot transfer to bed. PTA pt reports that he lives alone and was Mod I with a quad cane. Patient will benefit from continued inpatient follow up therapy, <3 hours/day. PT will continue to follow.       If plan is discharge home, recommend the following: Two people to help with walking and/or transfers;A lot of help with bathing/dressing/bathroom;Assistance with cooking/housework;Direct supervision/assist for medications management;Assist for transportation;Help with stairs or ramp for entrance   Can travel by private vehicle   No    Equipment Recommendations Other (comment) (Per accepting facility)  Recommendations for Other Services       Functional Status Assessment Patient has had a recent decline in their functional status and demonstrates the ability to make significant improvements in function in a reasonable and predictable amount of time.     Precautions / Restrictions Precautions Precautions: Fall Restrictions Weight Bearing Restrictions: Yes RLE Weight Bearing: Weight bearing as tolerated      Mobility  Bed Mobility Overal bed mobility: Needs Assistance Bed Mobility: Supine to Sit     Supine to sit: +2 for physical assistance, Max assist     General bed mobility comments: Helicopter method    Transfers Overall transfer level: Needs assistance Equipment used: Rolling walker (2 wheels) Transfers: Sit  to/from Stand, Bed to chair/wheelchair/BSC Sit to Stand: Max assist, From elevated surface Stand pivot transfers: Max assist         General transfer comment: Heavy max A to transfer. Pt hardly able to tolerate WB through RLE. Pt vomited once seated in chair.    Ambulation/Gait               General Gait Details: deferred for safety  Stairs            Wheelchair Mobility     Tilt Bed    Modified Rankin (Stroke Patients Only)       Balance Overall balance assessment: Needs assistance Sitting-balance support: Bilateral upper extremity supported Sitting balance-Leahy Scale: Good     Standing balance support: Bilateral upper extremity supported, During functional activity, Reliant on assistive device for balance Standing balance-Leahy Scale: Poor Standing balance comment: Reliant on RW                             Pertinent Vitals/Pain Pain Assessment Pain Assessment: 0-10 Pain Score: 4  Pain Location: R hip Pain Descriptors / Indicators: Discomfort, Grimacing Pain Intervention(s): Monitored during session, Premedicated before session    Home Living Family/patient expects to be discharged to:: Private residence Living Arrangements: Alone Available Help at Discharge: Neighbor;Available PRN/intermittently Type of Home: House (townhouse) Home Access: Level entry       Home Layout: One level Home Equipment: Grab bars - tub/shower;Rolling Walker (2 wheels);Cane - quad      Prior Function Prior Level of Function : Independent/Modified Independent;Driving;History of Falls (last six months)  Mobility Comments: Mod I quad cane ADLs Comments: Ind     Extremity/Trunk Assessment   Upper Extremity Assessment Upper Extremity Assessment: Generalized weakness    Lower Extremity Assessment Lower Extremity Assessment: RLE deficits/detail RLE Deficits / Details: R hip fx    Cervical / Trunk Assessment Cervical / Trunk  Assessment: Kyphotic  Communication   Communication Communication: Hearing impairment (Has hearing aids) Cueing Techniques: Verbal cues;Tactile cues  Cognition Arousal: Alert Behavior During Therapy: Anxious, WFL for tasks assessed/performed Overall Cognitive Status: Within Functional Limits for tasks assessed                                          General Comments General comments (skin integrity, edema, etc.): BP: 114/59 seated after transfer    Exercises     Assessment/Plan    PT Assessment Patient needs continued PT services  PT Problem List Decreased strength;Decreased activity tolerance;Decreased range of motion;Decreased balance;Decreased mobility;Decreased coordination;Decreased knowledge of use of DME;Decreased safety awareness;Decreased knowledge of precautions;Pain       PT Treatment Interventions DME instruction;Gait training;Stair training;Functional mobility training;Therapeutic activities;Therapeutic exercise;Balance training;Neuromuscular re-education;Patient/family education;Wheelchair mobility training    PT Goals (Current goals can be found in the Care Plan section)  Acute Rehab PT Goals Patient Stated Goal: to get better PT Goal Formulation: With patient Time For Goal Achievement: 02/12/23 Potential to Achieve Goals: Fair    Frequency Min 1X/week     Co-evaluation               AM-PAC PT "6 Clicks" Mobility  Outcome Measure Help needed turning from your back to your side while in a flat bed without using bedrails?: A Lot Help needed moving from lying on your back to sitting on the side of a flat bed without using bedrails?: A Lot Help needed moving to and from a bed to a chair (including a wheelchair)?: A Lot Help needed standing up from a chair using your arms (e.g., wheelchair or bedside chair)?: A Lot Help needed to walk in hospital room?: Total Help needed climbing 3-5 steps with a railing? : Total 6 Click Score: 10     End of Session Equipment Utilized During Treatment: Gait belt Activity Tolerance: Patient limited by pain;Patient tolerated treatment well Patient left: in chair;with call bell/phone within reach;with chair alarm set;with nursing/sitter in room Nurse Communication: Mobility status;Need for lift equipment PT Visit Diagnosis: Other abnormalities of gait and mobility (R26.89)    Time: 0907-1000 PT Time Calculation (min) (ACUTE ONLY): 53 min   Charges:   PT Evaluation $PT Eval Moderate Complexity: 1 Mod PT Treatments $Therapeutic Activity: 38-52 mins PT General Charges $$ ACUTE PT VISIT: 1 Visit         Shela Nevin, PT, DPT Acute Rehab Services [phone]   Gladys Damme 01/29/2023, 10:52 AM

## 2023-01-29 NOTE — Progress Notes (Signed)
PROGRESS NOTE  Steven Horton  EVO:[id number] DOB: 27-Jul-1930 DOA: 01/28/2023 PCP: [facility]   Brief Narrative: Patient is a 89 year old male with history of hypertension, DVT, hypothyroidism, CKD stage IIIb who presented for the evaluation of right hip and knee pain after a fall at home.  He lives alone, ambulates with the help of cane and walker at baseline.  No history of head injury or loss of concerns.  On presentation he was hypertensive.  Lab work showed creatinine of 1.6.  Right pelvis x-ray showed commuted and displaced intertrochanteric femur fracture.  Right knee x-ray showed right knee arthroplasty without complication or acute fracture.  Patient was admitted for further management.  Orthopedics consulted.  Status post ORIF with intramedullary implant on 10/3.  PT/OT evaluation done, recommended SNF on discharge.  TOC consulted  Assessment & Plan:  Principal Problem:   Closed intertrochanteric fracture of hip, right, initial encounter (HCC) Active Problems:   History of DVT (deep vein thrombosis)   Chronic kidney disease, stage 3b (HCC)   Comminuted/displaced intertrochanteric right femur fracture: Mechanical fall at home.  Orthopedics consulted.  Status post ORIF.  PT/OT done, recommendation is SNF.Marland Kitchen  Continue pain medication, bowel regimen  History of DVT: On Eliquis  CKD stage IIIb: Creatinine of 1.6.  Currently stable and at baseline  Hypertension: Currently blood pressure stable.  On amlodipine  Hypothyroidism: On levothyroxine  BPH: On Flomax  Low Vitamin D: Started supplementation  Thrombocytopenia: Most likely chronic, stable  Normocytic anemia: Hemoglobin dropped from 15.2 to 12.8.  Will continue monitoring         DVT prophylaxis:SCDs Start: 01/28/23 1957 SCDs Start: 01/28/23 1635     Code Status: Full Code  Family Communication: Called and discussed with niece on 10/4  Patient status: Inpatient  Patient is from :  Home  Anticipated discharge to: SNF  Estimated DC date: 2 to 3 days   Consultants: Orthopedics  Procedures:ORIF  Antimicrobials:  Anti-infectives (From admission, onward)    Start     Dose/Rate Route Frequency Ordered Stop   01/28/23 2045  ceFAZolin (ANCEF) IVPB 2g/100 mL premix        2 g 200 mL/hr over 30 Minutes Intravenous Every 6 hours 01/28/23 1956 01/29/23 0327   01/28/23 1600  ceFAZolin (ANCEF) IVPB 2g/100 mL premix        2 g 200 mL/hr over 30 Minutes Intravenous On call to O.R. 01/28/23 1549 01/28/23 1826       Subjective: Patient seen and examined at bedside today.  Hemodynamically stable.  Appears overall comfortable.  Pain on the operated area is well-controlled.  He was able to work with the physical therapist.  Alert and oriented.  Objective: Vitals:   01/28/23 2010 01/28/23 2339 01/29/23 0340 01/29/23 0754  BP: (!) 141/58 136/61 (!) 102/47 (!) 155/63  Pulse: 72 78 72 75  Resp: 20 18 18 16   Temp:  98.2 F (36.8 C)  98.4 F (36.9 C)  TempSrc:  Oral Oral   SpO2: 99% 98% 95% 96%  Height:        Intake/Output Summary (Last 24 hours) at 01/29/2023 0814 Last data filed at 01/29/2023 0300 Gross per 24 hour  Intake 600 ml  Output 400 ml  Net 200 ml   There were no vitals filed for this visit.  Examination:  General exam: Overall comfortable, not in distress, pleasant elderly male, very hard of hearing HEENT: PERRL Respiratory system: Mild bibasilar crackles Cardiovascular system: S1 & S2 heard, RRR.  Gastrointestinal system: Abdomen is nondistended, soft and nontender. Central nervous system: Alert and oriented Extremities: No edema, no clubbing ,no cyanosis, clean surgical wound on the right hip Skin: No rashes, no ulcers,no icterus     Data Reviewed: I have personally reviewed following labs and imaging studies  CBC: Recent Labs  Lab 01/28/23 1440 01/29/23 0346  WBC 6.7 8.4  NEUTROABS 4.7  --   HGB 15.2 12.8*  HCT 44.4 37.7*  MCV 96.3  98.7  PLT 146* 146*   Basic Metabolic Panel: Recent Labs  Lab 01/28/23 1440 01/29/23 0346  NA 141 136  K 4.2 4.2  CL 108 104  CO2 19* 21*  GLUCOSE 102* 147*  BUN 28* 27*  CREATININE 1.60* 1.63*  CALCIUM 8.9 8.0*     Recent Results (from the past 240 hour(s))  Surgical pcr screen     Status: Abnormal   Collection Time: 01/28/23  4:54 PM   Specimen: Nasal Mucosa; Nasal Swab  Result Value Ref Range Status   MRSA, PCR (A) NEGATIVE Corrected    INVALID, UNABLE TO DETERMINE THE PRESENCE OF TARGET DUE TO SPECIMEN INTEGRITY. RECOLLECTION REQUESTED.   Staphylococcus aureus (A) NEGATIVE Corrected    INVALID, UNABLE TO DETERMINE THE PRESENCE OF TARGET DUE TO SPECIMEN INTEGRITY. RECOLLECTION REQUESTED.    Comment: Performed at [facility], [address], [place], [place] [address] CORRECTED ON 10/03 AT 2043: PREVIOUSLY REPORTED AS INVALID, UNABLE TO DETERMINE THE PRESENCE OF TARGET DUE TO SPECIMEN INTEGRITY. RECOLLECTION REQUESTED. emailed Lindsi Forte on 01/28/23 @ 2042 by drt      Radiology Studies: DG FEMUR, MIN 2 VIEWS RIGHT  Result Date: 01/28/2023 CLINICAL DATA:  Elective surgery. EXAM: RIGHT FEMUR 2 VIEWS COMPARISON:  Preoperative imaging FINDINGS: Four fluoroscopic spot views of the right proximal femur obtained in the operating room. Intramedullary nail with trans trochanteric and distal locking screw fixation traverse proximal femur fracture. Fluoroscopy time 47.9 seconds. Dose 7.46 mGy. IMPRESSION: Intraoperative fluoroscopy during ORIF of proximal femur fracture. Electronically Signed   By: Narda Rutherford M.D.   On: 01/28/2023 19:26   DG C-Arm 1-60 Min-No Report  Result Date: 01/28/2023 Fluoroscopy was utilized by the requesting physician.  No radiographic interpretation.   DG Chest Port 1 View  Result Date: 01/28/2023 CLINICAL DATA:  Fall with hip fracture. EXAM: PORTABLE CHEST 1 VIEW COMPARISON:  08/01/2020 FINDINGS: Lung volumes are low. Upper normal heart size  likely accentuated by technique and low lung volumes. Aortic tortuosity and atherosclerosis. Bronchovascular crowding versus vascular congestion. No pneumothorax or pleural effusion. No focal airspace disease. On limited assessment, no acute osseous findings. There is a staple no region of the right axilla. IMPRESSION: Low lung volumes with bronchovascular crowding versus vascular congestion. Electronically Signed   By: Narda Rutherford M.D.   On: 01/28/2023 16:24   DG Hip Unilat W or Wo Pelvis 2-3 Views Right  Result Date: 01/28/2023 CLINICAL DATA:  Status post fall with right hip pain. EXAM: DG HIP (WITH OR WITHOUT PELVIS) 2-3V RIGHT COMPARISON:  None Available. FINDINGS: Comminuted and displaced intertrochanteric femur fracture. Fracture displacement up to 1 cm. Femoral head is well seated, no hip dislocation. No additional fracture. Bony pelvis including pubic rami are intact. Pubic symphysis and sacroiliac joints are congruent. Mild bilateral hip osteoarthritis. IMPRESSION: Comminuted and displaced intertrochanteric femur fracture. Electronically Signed   By: Narda Rutherford M.D.   On: 01/28/2023 16:23   DG Knee Complete 4 Views Right  Result Date: 01/28/2023 CLINICAL DATA:  Fall with right hip and knee pain. EXAM: RIGHT KNEE - COMPLETE 4+ VIEW COMPARISON:  None Available. FINDINGS: Right knee arthroplasty in expected alignment. Prior patellar resurfacing. No acute or periprosthetic fracture. There is no periprosthetic lucency. Quadriceps and patellar tendon enthesophytes. There is a small joint effusion. Mild soft tissue edema. IMPRESSION: 1. Right knee arthroplasty without complication or acute fracture. 2. Small joint effusion. Electronically Signed   By: Narda Rutherford M.D.   On: 01/28/2023 16:22    Scheduled Meds:  acetaminophen  500 mg Oral Q6H   docusate sodium  100 mg Oral BID   Continuous Infusions:  methocarbamol (ROBAXIN) IV       LOS: 1 day   Burnadette Pop, MD [facility] P10/07/2022, 8:14 AM

## 2023-01-29 NOTE — [place] FL2 (Signed)
[place] MEDICAID FL2 LEVEL OF CARE FORM     IDENTIFICATION  Patient Name: Steven Horton Birthdate: 06-10-1930 Sex: male Admission Date (Current Location): 01/28/2023  [facility] and [place] Number:  Producer, television/film/video and Address:  [facility] [place], [address] [place], [place] [address]      Provider Number: [id number]  Attending Physician Name and Address:  Burnadette Pop, MD  Relative Name and Phone Number:  MUIRHEAD P.O.A,BARBARA Niece [phone]  [phone]    Current Level of Care: Hospital Recommended Level of Care: Skilled Nursing Facility Prior Approval Number:    Date Approved/Denied:   PASRR Number: [id number]  Discharge Plan: SNF    Current Diagnoses: Patient Active Problem List   Diagnosis Date Noted   Closed intertrochanteric fracture of hip, right, initial encounter (HCC) 01/28/2023   History of DVT (deep vein thrombosis) 01/28/2023   Chronic kidney disease, stage 3b (HCC) 01/28/2023   Primary osteoarthritis of left knee 01/15/2017   Failed total knee, left (HCC) 01/14/2017   Phlebitis and thrombophlebitis of other deep vessels of lower extremities 02/10/2013   Leg swelling 02/10/2013   Pain in joint, lower leg 02/10/2013   DVT (deep venous thrombosis) (HCC) 02/10/2013   Baker's cyst, ruptured 02/10/2013    Orientation RESPIRATION BLADDER Height & Weight     Self, Situation, Place  Normal Continent Weight:   Height:  5\' 5"  (165.1 cm)  BEHAVIORAL SYMPTOMS/MOOD NEUROLOGICAL BOWEL NUTRITION STATUS      Continent Diet (see discharge summary)  AMBULATORY STATUS COMMUNICATION OF NEEDS Skin   Total Care Verbally Surgical wounds                       Personal Care Assistance Level of Assistance  Bathing, Feeding, Dressing Bathing Assistance: Maximum assistance Feeding assistance: Limited assistance Dressing Assistance: Maximum assistance     Functional Limitations Info  Sight, Hearing, Speech Sight Info:  Adequate Hearing Info: Impaired Speech Info: Adequate    SPECIAL CARE FACTORS FREQUENCY  PT (By licensed PT), OT (By licensed OT)     PT Frequency: 5x week OT Frequency: 5x week            Contractures Contractures Info: Not present    Additional Factors Info  Code Status, Allergies Code Status Info: full Allergies Info: prednisone           Current Medications (01/29/2023):  This is the current hospital active medication list Current Facility-Administered Medications  Medication Dose Route Frequency Provider Last Rate Last Admin   acetaminophen (TYLENOL) tablet 325-650 mg  325-650 mg Oral Q6H PRN Yolonda Kida, MD       acetaminophen (TYLENOL) tablet 500 mg  500 mg Oral Q6H Yolonda Kida, MD       amLODipine [facility]) tablet 2.5 mg  2.5 mg Oral Daily Adhikari, Amrit, MD   2.5 mg at 01/29/23 0950   apixaban (ELIQUIS) tablet 2.5 mg  2.5 mg Oral BID Dion Saucier D, PA   2.5 mg at 01/29/23 1206   bisacodyl (DULCOLAX) EC tablet 5 mg  5 mg Oral Daily PRN Yolonda Kida, MD       docusate sodium (COLACE) capsule 100 mg  100 mg Oral BID Yolonda Kida, MD   100 mg at 01/29/23 0910   hydrALAZINE (APRESOLINE) injection 10 mg  10 mg Intravenous Q4H PRN Yolonda Kida, MD       HYDROcodone-acetaminophen (NORCO) 7.5-325 MG per tablet 1-2 tablet  1-2 tablet Oral Q4H PRN Yolonda Kida, MD   1 tablet at 01/29/23 1206   HYDROcodone-acetaminophen (NORCO/VICODIN) 5-325 MG per tablet 1 tablet  1 tablet Oral Q4H PRN Yolonda Kida, MD       HYDROmorphone (DILAUDID) injection 0.5 mg  0.5 mg Intravenous Q2H PRN Yolonda Kida, MD   0.5 mg at 01/29/23 0259   levothyroxine (SYNTHROID) tablet 75 mcg  75 mcg Oral Q0600 Burnadette Pop, MD   75 mcg at 01/29/23 0910   menthol-cetylpyridinium (CEPACOL) lozenge 3 mg  1 lozenge Oral PRN Yolonda Kida, MD       Or   phenol [facility]) mouth spray 1 spray  1 spray Mouth/Throat PRN  Yolonda Kida, MD       methocarbamol (ROBAXIN) tablet 500 mg  500 mg Oral Q6H PRN Yolonda Kida, MD       Or   methocarbamol (ROBAXIN) 500 mg in dextrose 5 % 50 mL IVPB  500 mg Intravenous Q6H PRN Yolonda Kida, MD       metoCLOPramide (REGLAN) tablet 5-10 mg  5-10 mg Oral Q8H PRN Yolonda Kida, MD       Or   metoCLOPramide (REGLAN) injection 5-10 mg  5-10 mg Intravenous Q8H PRN Yolonda Kida, MD       ondansetron [facility]) tablet 4 mg  4 mg Oral Q6H PRN Yolonda Kida, MD       Or   ondansetron [facility]) injection 4 mg  4 mg Intravenous Q6H PRN Yolonda Kida, MD   4 mg at 01/29/23 1610   polyethylene glycol (MIRALAX / GLYCOLAX) packet 17 g  17 g Oral Daily Burnadette Pop, MD   17 g at 01/29/23 1206   senna-docusate (Senokot-S) tablet 1 tablet  1 tablet Oral BID Burnadette Pop, MD   1 tablet at 01/29/23 1206   tamsulosin (FLOMAX) capsule 0.4 mg  0.4 mg Oral QPM Adhikari, Willia Craze, MD       Vitamin D (Ergocalciferol) (DRISDOL) 1.25 MG (50000 UNIT) capsule 50,000 Units  50,000 Units Oral Q Britt Bottom, MD   50,000 Units at 01/29/23 1300     Discharge Medications: Please see discharge summary for a list of discharge medications.  Relevant Imaging Results:  Relevant Lab Results:   Additional Information SS#: [id number]  Lorri Frederick, LCSW

## 2023-01-29 NOTE — Plan of Care (Signed)

## 2023-01-30 DIAGNOSIS — S72141A Displaced intertrochanteric fracture of right femur, initial encounter for closed fracture: Secondary | ICD-10-CM | POA: Diagnosis not present

## 2023-01-30 LAB — BASIC METABOLIC PANEL
Anion gap: 12 (ref 5–15)
BUN: 31 mg/dL — ABNORMAL HIGH (ref 8–23)
CO2: 20 mmol/L — ABNORMAL LOW (ref 22–32)
Calcium: 8.3 mg/dL — ABNORMAL LOW (ref 8.9–10.3)
Chloride: 104 mmol/L (ref 98–111)
Creatinine, Ser: 1.69 mg/dL — ABNORMAL HIGH (ref 0.61–1.24)
GFR, Estimated: 38 mL/min — ABNORMAL LOW (ref >60)
Glucose, Bld: 108 mg/dL — ABNORMAL HIGH (ref 70–99)
Potassium: 4.5 mmol/L (ref 3.5–5.1)
Sodium: 136 mmol/L (ref 135–145)

## 2023-01-30 LAB — CBC
HCT: 35.3 % — ABNORMAL LOW (ref 39.0–52.0)
Hemoglobin: 11.8 g/dL — ABNORMAL LOW (ref 13.0–17.0)
MCH: 32 pg (ref 26.0–34.0)
MCHC: 33.4 g/dL (ref 30.0–36.0)
MCV: 95.7 fL (ref 80.0–100.0)
Platelets: 139 10*3/uL — ABNORMAL LOW (ref 150–400)
RBC: 3.69 MIL/uL — ABNORMAL LOW (ref 4.22–5.81)
RDW: 14.1 % (ref 11.5–15.5)
WBC: 8.6 10*3/uL (ref 4.0–10.5)
nRBC: 0 % (ref 0.0–0.2)

## 2023-01-30 NOTE — Progress Notes (Signed)
PROGRESS NOTE  [place] Nurse  VQQ:[id number] DOB: [birth year] DOA: 01/28/2023 PCP: [facility]   Brief Narrative: Patient is a 89 year old male with history of hypertension, DVT, hypothyroidism, CKD stage IIIb who presented for the evaluation of right hip and knee pain after a fall at home.  He lives alone, ambulates with the help of cane and walker at baseline.  No history of head injury or loss of concerns.  On presentation he was hypertensive.  Lab work showed creatinine of 1.6.  Right pelvis x-ray showed commuted and displaced intertrochanteric femur fracture.  Right knee x-ray showed right knee arthroplasty without complication or acute fracture.  Patient was admitted for further management.  Orthopedics consulted.  Status post ORIF with intramedullary implant on 10/3.  PT/OT evaluation done, recommended SNF on discharge.  TOC consulted. Patient is medically stable for discharge to SNF whenever possible.  Assessment & Plan:  Principal Problem:   Closed intertrochanteric fracture of hip, right, initial encounter (HCC) Active Problems:   History of DVT (deep vein thrombosis)   Chronic kidney disease, stage 3b (HCC)   Comminuted/displaced intertrochanteric right femur fracture: Mechanical fall at home.  Orthopedics consulted.  Status post ORIF.  PT/OT done, recommendation is SNF.  Continue pain medication, bowel regimen  History of DVT: On Eliquis  CKD stage IIIb: Creatinine of 1.6.  Currently stable and at baseline  Hypertension: Currently blood pressure stable.  On amlodipine  Hypothyroidism: On levothyroxine  BPH: On Flomax  Low Vitamin D: Started supplementation  Thrombocytopenia: Most likely chronic, stable  Normocytic anemia: Hemoglobin dropped from 15.2 to 12.8.  Will continue monitoring         DVT prophylaxis:apixaban (ELIQUIS) tablet 2.5 mg Start: 01/29/23 1200 SCDs Start: 01/28/23 1957 SCDs Start: 01/28/23 1635 apixaban (ELIQUIS) tablet 2.5 mg      Code Status: Full Code  Family Communication: Called and discussed with niece on 10/4  Patient status: Inpatient  Patient is from : Home  Anticipated discharge to: SNF  Estimated DC date: whenever possible   Consultants: Orthopedics  Procedures:ORIF  Antimicrobials:  Anti-infectives (From admission, onward)    Start     Dose/Rate Route Frequency Ordered Stop   01/28/23 2045  ceFAZolin (ANCEF) IVPB 2g/100 mL premix        2 g 200 mL/hr over 30 Minutes Intravenous Every 6 hours 01/28/23 1956 01/29/23 0327   01/28/23 1600  ceFAZolin (ANCEF) IVPB 2g/100 mL premix        2 g 200 mL/hr over 30 Minutes Intravenous On call to O.R. 01/28/23 1549 01/28/23 1826       Subjective: Patient seen and examined at bedside today.  Lying in bed.  Pain well-controlled.  Denies any new complaints today.  Overall appears comfortable  Objective: Vitals:   01/29/23 0950 01/29/23 1532 01/29/23 2040 01/30/23 0633  BP: (!) 114/59 129/66 (!) 129/49 (!) 127/59  Pulse:  80 85 80  Resp:  18 16 16   Temp:  97.9 F (36.6 C) 98.7 F (37.1 C) 98.5 F (36.9 C)  TempSrc:  Oral Oral   SpO2:  96% 95% 96%  Height:        Intake/Output Summary (Last 24 hours) at 01/30/2023 1046 Last data filed at 01/30/2023 4332 Gross per 24 hour  Intake --  Output 650 ml  Net -650 ml   There were no vitals filed for this visit.  Examination:   General exam: Overall comfortable, not in distress HEENT: PERRL, very hard of hearing Respiratory system:  no wheezes , mild bibasilar crackles, good air entry Cardiovascular system: S1 & S2 heard, RRR.  Gastrointestinal system: Abdomen is nondistended, soft and nontender. Central nervous system: Alert and oriented Extremities: No edema, no clubbing ,no cyanosis, surgical wound on the right hip Skin: No rashes, no ulcers,no icterus     Data Reviewed: I have personally reviewed following labs and imaging studies  CBC: Recent Labs  Lab 01/28/23 1440  01/29/23 0346  WBC 6.7 8.4  NEUTROABS 4.7  --   HGB 15.2 12.8*  HCT 44.4 37.7*  MCV 96.3 98.7  PLT 146* 146*   Basic Metabolic Panel: Recent Labs  Lab 01/28/23 1440 01/29/23 0346  NA 141 136  K 4.2 4.2  CL 108 104  CO2 19* 21*  GLUCOSE 102* 147*  BUN 28* 27*  CREATININE 1.60* 1.63*  CALCIUM 8.9 8.0*     Recent Results (from the past 240 hour(s))  Surgical pcr screen     Status: Abnormal   Collection Time: 01/28/23  4:54 PM   Specimen: Nasal Mucosa; Nasal Swab  Result Value Ref Range Status   MRSA, PCR (A) NEGATIVE Corrected    INVALID, UNABLE TO DETERMINE THE PRESENCE OF TARGET DUE TO SPECIMEN INTEGRITY. RECOLLECTION REQUESTED.   Staphylococcus aureus (A) NEGATIVE Corrected    INVALID, UNABLE TO DETERMINE THE PRESENCE OF TARGET DUE TO SPECIMEN INTEGRITY. RECOLLECTION REQUESTED.    Comment: Performed at [facility], [address] [place], [place] [address] CORRECTED ON 10/03 AT 2043: PREVIOUSLY REPORTED AS INVALID, UNABLE TO DETERMINE THE PRESENCE OF TARGET DUE TO SPECIMEN INTEGRITY. RECOLLECTION REQUESTED. emailed Lindsi Forte on 01/28/23 @ 2042 by drt      Radiology Studies: DG FEMUR, MIN 2 VIEWS RIGHT  Result Date: 01/28/2023 CLINICAL DATA:  Elective surgery. EXAM: RIGHT FEMUR 2 VIEWS COMPARISON:  Preoperative imaging FINDINGS: Four fluoroscopic spot views of the right proximal femur obtained in the operating room. Intramedullary nail with trans trochanteric and distal locking screw fixation traverse proximal femur fracture. Fluoroscopy time 47.9 seconds. Dose 7.46 mGy. IMPRESSION: Intraoperative fluoroscopy during ORIF of proximal femur fracture. Electronically Signed   By: Narda Rutherford M.D.   On: 01/28/2023 19:26   DG C-Arm 1-60 Min-No Report  Result Date: 01/28/2023 Fluoroscopy was utilized by the requesting physician.  No radiographic interpretation.   DG Chest Port 1 View  Result Date: 01/28/2023 CLINICAL DATA:  Fall with hip fracture. EXAM:  PORTABLE CHEST 1 VIEW COMPARISON:  08/01/2020 FINDINGS: Lung volumes are low. Upper normal heart size likely accentuated by technique and low lung volumes. Aortic tortuosity and atherosclerosis. Bronchovascular crowding versus vascular congestion. No pneumothorax or pleural effusion. No focal airspace disease. On limited assessment, no acute osseous findings. There is a staple no region of the right axilla. IMPRESSION: Low lung volumes with bronchovascular crowding versus vascular congestion. Electronically Signed   By: Narda Rutherford M.D.   On: 01/28/2023 16:24   DG Hip Unilat W or Wo Pelvis 2-3 Views Right  Result Date: 01/28/2023 CLINICAL DATA:  Status post fall with right hip pain. EXAM: DG HIP (WITH OR WITHOUT PELVIS) 2-3V RIGHT COMPARISON:  None Available. FINDINGS: Comminuted and displaced intertrochanteric femur fracture. Fracture displacement up to 1 cm. Femoral head is well seated, no hip dislocation. No additional fracture. Bony pelvis including pubic rami are intact. Pubic symphysis and sacroiliac joints are congruent. Mild bilateral hip osteoarthritis. IMPRESSION: Comminuted and displaced intertrochanteric femur fracture. Electronically Signed   By: Narda Rutherford M.D.   On:  01/28/2023 16:23   DG Knee Complete 4 Views Right  Result Date: 01/28/2023 CLINICAL DATA:  Fall with right hip and knee pain. EXAM: RIGHT KNEE - COMPLETE 4+ VIEW COMPARISON:  None Available. FINDINGS: Right knee arthroplasty in expected alignment. Prior patellar resurfacing. No acute or periprosthetic fracture. There is no periprosthetic lucency. Quadriceps and patellar tendon enthesophytes. There is a small joint effusion. Mild soft tissue edema. IMPRESSION: 1. Right knee arthroplasty without complication or acute fracture. 2. Small joint effusion. Electronically Signed   By: Narda Rutherford M.D.   On: 01/28/2023 16:22    Scheduled Meds:  amLODipine  2.5 mg Oral Daily   apixaban  2.5 mg Oral BID   docusate sodium   100 mg Oral BID   levothyroxine  75 mcg Oral Q0600   polyethylene glycol  17 g Oral Daily   senna-docusate  1 tablet Oral BID   tamsulosin  0.4 mg Oral QPM   Vitamin D (Ergocalciferol)  50,000 Units Oral Q Fri   Continuous Infusions:  methocarbamol (ROBAXIN) IV       LOS: 2 days   Burnadette Pop, MD [facility] P10/08/2022, 10:46 AM

## 2023-01-30 NOTE — Progress Notes (Signed)
Steven Horton  MRN: [id number] DOB/Age: [birth year] 89 y.o. Physician: Lynnea Maizes, M.D. 2 Days Post-Op Procedure(s) (LRB): INTRAMEDULLARY (IM) NAIL INTERTROCHANTERIC (Right)  Subjective: Patient resting comfortably in bed although significantly hard of hearing.  Cooperative with exam. Vital Signs Temp:  [97.9 F (36.6 C)-98.7 F (37.1 C)] 98.5 F (36.9 C) (10/05 4098) Pulse Rate:  [80-85] 80 (10/05 0633) Resp:  [16-18] 16 (10/05 0633) BP: (114-129)/(49-66) 127/59 (10/05 0633) SpO2:  [95 %-96 %] 96 % (10/05 0633)  Lab Results Recent Labs    01/28/23 1440 01/29/23 0346  WBC 6.7 8.4  HGB 15.2 12.8*  HCT 44.4 37.7*  PLT 146* 146*   BMET Recent Labs    01/28/23 1440 01/29/23 0346  NA 141 136  K 4.2 4.2  CL 108 104  CO2 19* 21*  GLUCOSE 102* 147*  BUN 28* 27*  CREATININE 1.60* 1.63*  CALCIUM 8.9 8.0*   INR  Date Value Ref Range Status  01/28/2023 1.2 0.8 - 1.2 Final    Comment:    (NOTE) INR goal varies based on device and disease states. Performed at [facility], [address], [place], [place] [address]      Exam  Postop dressings remain clean and dry.  Compartments soft.  Poor effort with ankle dorsi and plantarflexion but grossly nonfocal neuroexam  Plan Continue to mobilize with PT per postop plan.  PT recommending SNF once medically stable. Om Steven Horton 01/30/2023, 9:46 AM   Contact # [phone]

## 2023-01-30 NOTE — TOC Progression Note (Signed)
Transition of Care [facility]) - Progression Note    Patient Details  Name: Steven Horton MRN: [id number] Date of Birth: 1930-12-12  Transition of Care [facility]) CM/SW Contact  Jimmy Picket, [place] Phone Number: 01/30/2023, 3:20 PM  Clinical Narrative:    CSW spoke with pts niece [name] on the phone. CSW gave bed offers. [name] was disappointment with current bed offers. CSW sent text message to pending facilities to review the patient.    Expected Discharge Plan: Skilled Nursing Facility Barriers to Discharge: Continued Medical Work up, SNF Pending bed offer  Expected Discharge Plan and Services In-house Referral: Clinical Social Work   Post Acute Care Choice: Skilled Nursing Facility Living arrangements for the past 2 months: Single Family Home                                       Social Determinants of Health (SDOH) Interventions SDOH Screenings   Tobacco Use: Medium Risk (01/28/2023)    Readmission Risk Interventions     No data to display

## 2023-01-31 DIAGNOSIS — S72141A Displaced intertrochanteric fracture of right femur, initial encounter for closed fracture: Secondary | ICD-10-CM | POA: Diagnosis not present

## 2023-01-31 LAB — CBC
HCT: 34.1 % — ABNORMAL LOW (ref 39.0–52.0)
Hemoglobin: 11.2 g/dL — ABNORMAL LOW (ref 13.0–17.0)
MCH: 31.7 pg (ref 26.0–34.0)
MCHC: 32.8 g/dL (ref 30.0–36.0)
MCV: 96.6 fL (ref 80.0–100.0)
Platelets: 136 10*3/uL — ABNORMAL LOW (ref 150–400)
RBC: 3.53 MIL/uL — ABNORMAL LOW (ref 4.22–5.81)
RDW: 14 % (ref 11.5–15.5)
WBC: 7.9 10*3/uL (ref 4.0–10.5)
nRBC: 0 % (ref 0.0–0.2)

## 2023-01-31 NOTE — TOC CAGE-AID Note (Signed)
Transition of Care [facility]) - CAGE-AID Screening   Patient Details  Name: Steven Horton MRN: [id number] Date of Birth: [birth year]  Transition [facility] Care [facility]) CM/SW Contact:    Janora Norlander, RN Phone Number: [phone] 01/31/2023, 4:32 PM   Clinical Narrative: Pt here after sustaining a R femur fx due to a fall.  Pt denies drug use but states that he does drink some alcohol - approx 4 standard drinks a week.  Pt does not need resources.  Screening complete.   CAGE-AID Screening:    Have You Ever Felt You Ought to Cut Down on Your Drinking or Drug Use?: No Have People Annoyed You By Critizing Your Drinking Or Drug Use?: No Have You Felt Bad Or Guilty About Your Drinking Or Drug Use?: No Have You Ever Had a Drink or Used Drugs First Thing In The Morning to Steady Your Nerves or to Get Rid of a Hangover?: No CAGE-AID Score: 0  Substance Abuse Education Offered: No

## 2023-01-31 NOTE — Plan of Care (Signed)

## 2023-01-31 NOTE — Progress Notes (Signed)
PROGRESS NOTE  Steven Horton  WUJ:[id number] DOB: [birth year] DOA: 01/28/2023 PCP: [facility]   Brief Narrative: Patient is a 89 year old male with history of hypertension, DVT, hypothyroidism, CKD stage IIIb who presented for the evaluation of right hip and knee pain after a fall at home.  He lives alone, ambulates with the help of cane and walker at baseline.  No history of head injury or loss of concerns.  On presentation he was hypertensive.  Lab work showed creatinine of 1.6.  Right pelvis x-ray showed commuted and displaced intertrochanteric femur fracture.  Right knee x-ray showed right knee arthroplasty without complication or acute fracture.  Patient was admitted for further management.  Orthopedics consulted.  Status post ORIF with intramedullary implant on 10/3.  PT/OT evaluation done, recommended SNF on discharge.  TOC consulted. Patient is medically stable for discharge to SNF whenever possible.  Assessment & Plan:  Principal Problem:   Closed intertrochanteric fracture of hip, right, initial encounter (HCC) Active Problems:   History of DVT (deep vein thrombosis)   Chronic kidney disease, stage 3b (HCC)   Comminuted/displaced intertrochanteric right femur fracture: Mechanical fall at home.  Orthopedics consulted.  Status post ORIF.  PT/OT done, recommendation is SNF.  Continue pain medication, bowel regimen  History of DVT: On Eliquis  CKD stage IIIb: Creatinine of 1.6.  Currently stable and at baseline  Hypertension: Currently blood pressure stable.  On amlodipine  Hypothyroidism: On levothyroxine  BPH: On Flomax  Low Vitamin D: Started supplementation  Thrombocytopenia: Most likely chronic, stable  Normocytic anemia: Hemoglobin stable.  Will continue monitoring         DVT prophylaxis:apixaban (ELIQUIS) tablet 2.5 mg Start: 01/29/23 1200 SCDs Start: 01/28/23 1957 SCDs Start: 01/28/23 1635 apixaban (ELIQUIS) tablet 2.5 mg     Code Status: Full  Code  Family Communication: Called and discussed with niece on 10/4  Patient status: Inpatient  Patient is from : Home  Anticipated discharge to: SNF  Estimated DC date: whenever possible   Consultants: Orthopedics  Procedures:ORIF  Antimicrobials:  Anti-infectives (From admission, onward)    Start     Dose/Rate Route Frequency Ordered Stop   01/28/23 2045  ceFAZolin (ANCEF) IVPB 2g/100 mL premix        2 g 200 mL/hr over 30 Minutes Intravenous Every 6 hours 01/28/23 1956 01/29/23 0327   01/28/23 1600  ceFAZolin (ANCEF) IVPB 2g/100 mL premix        2 g 200 mL/hr over 30 Minutes Intravenous On call to O.R. 01/28/23 1549 01/28/23 1826       Subjective: Patient seen and examined at bedside today.  Hemodynamically stable.  Pain on the right hip well-controlled.  No new complaints.  Last bowel movement on 10/2.  Appears comfortable  Objective: Vitals:   01/30/23 2045 01/31/23 0442 01/31/23 0821 01/31/23 0824  BP: (!) 137/57 (!) 132/59 (!) 130/53 (!) 130/53  Pulse: 83 77  83  Resp: 16 16  18   Temp: 99.3 F (37.4 C) 98.1 F (36.7 C)  98.3 F (36.8 C)  TempSrc:    Oral  SpO2: 96% 99%  96%  Height:        Intake/Output Summary (Last 24 hours) at 01/31/2023 1131 Last data filed at 01/31/2023 9562 Gross per 24 hour  Intake 240 ml  Output 1140 ml  Net -900 ml   There were no vitals filed for this visit.  Examination:  General exam: Overall comfortable, not in distress HEENT: PERRL,very hard of hearing Respiratory  system:  no wheezes, mild bibasilar crackles Cardiovascular system: S1 & S2 heard, RRR.  Gastrointestinal system: Abdomen is nondistended, soft and nontender. Central nervous system: Alert and oriented Extremities: No edema, no clubbing ,no cyanosis, surgical wound on the right hip Skin: No rashes, no ulcers,no icterus     Data Reviewed: I have personally reviewed following labs and imaging studies  CBC: Recent Labs  Lab 01/28/23 1440  01/29/23 0346 01/30/23 1020 01/31/23 0806  WBC 6.7 8.4 8.6 7.9  NEUTROABS 4.7  --   --   --   HGB 15.2 12.8* 11.8* 11.2*  HCT 44.4 37.7* 35.3* 34.1*  MCV 96.3 98.7 95.7 96.6  PLT 146* 146* 139* 136*   Basic Metabolic Panel: Recent Labs  Lab 01/28/23 1440 01/29/23 0346 01/30/23 1020  NA 141 136 136  K 4.2 4.2 4.5  CL 108 104 104  CO2 19* 21* 20*  GLUCOSE 102* 147* 108*  BUN 28* 27* 31*  CREATININE 1.60* 1.63* 1.69*  CALCIUM 8.9 8.0* 8.3*     Recent Results (from the past 240 hour(s))  Surgical pcr screen     Status: Abnormal   Collection Time: 01/28/23  4:54 PM   Specimen: Nasal Mucosa; Nasal Swab  Result Value Ref Range Status   MRSA, PCR (A) NEGATIVE Corrected    INVALID, UNABLE TO DETERMINE THE PRESENCE OF TARGET DUE TO SPECIMEN INTEGRITY. RECOLLECTION REQUESTED.   Staphylococcus aureus (A) NEGATIVE Corrected    INVALID, UNABLE TO DETERMINE THE PRESENCE OF TARGET DUE TO SPECIMEN INTEGRITY. RECOLLECTION REQUESTED.    Comment: Performed at [facility], [address] [place], [place] [address] CORRECTED ON 10/03 AT 2043: PREVIOUSLY REPORTED AS INVALID, UNABLE TO DETERMINE THE PRESENCE OF TARGET DUE TO SPECIMEN INTEGRITY. RECOLLECTION REQUESTED. emailed Lindsi Forte on 01/28/23 @ 2042 by drt      Radiology Studies: No results found.  Scheduled Meds:  amLODipine  2.5 mg Oral Daily   apixaban  2.5 mg Oral BID   docusate sodium  100 mg Oral BID   levothyroxine  75 mcg Oral Q0600   polyethylene glycol  17 g Oral Daily   senna-docusate  1 tablet Oral BID   tamsulosin  0.4 mg Oral QPM   Vitamin D (Ergocalciferol)  50,000 Units Oral Q Fri   Continuous Infusions:  methocarbamol (ROBAXIN) IV       LOS: 3 days   Burnadette Pop, MD [facility] P10/09/2022, 11:31 AM

## 2023-02-01 DIAGNOSIS — N400 Enlarged prostate without lower urinary tract symptoms: Secondary | ICD-10-CM | POA: Diagnosis not present

## 2023-02-01 DIAGNOSIS — M1712 Unilateral primary osteoarthritis, left knee: Secondary | ICD-10-CM | POA: Diagnosis not present

## 2023-02-01 DIAGNOSIS — Z4789 Encounter for other orthopedic aftercare: Secondary | ICD-10-CM | POA: Diagnosis not present

## 2023-02-01 DIAGNOSIS — M47816 Spondylosis without myelopathy or radiculopathy, lumbar region: Secondary | ICD-10-CM | POA: Diagnosis not present

## 2023-02-01 DIAGNOSIS — F32A Depression, unspecified: Secondary | ICD-10-CM | POA: Diagnosis not present

## 2023-02-01 DIAGNOSIS — D696 Thrombocytopenia, unspecified: Secondary | ICD-10-CM | POA: Diagnosis not present

## 2023-02-01 DIAGNOSIS — R509 Fever, unspecified: Secondary | ICD-10-CM | POA: Diagnosis not present

## 2023-02-01 DIAGNOSIS — E039 Hypothyroidism, unspecified: Secondary | ICD-10-CM | POA: Diagnosis not present

## 2023-02-01 DIAGNOSIS — S79929A Unspecified injury of unspecified thigh, initial encounter: Secondary | ICD-10-CM | POA: Diagnosis not present

## 2023-02-01 DIAGNOSIS — R0602 Shortness of breath: Secondary | ICD-10-CM | POA: Diagnosis not present

## 2023-02-01 DIAGNOSIS — D8481 Immunodeficiency due to conditions classified elsewhere: Secondary | ICD-10-CM | POA: Diagnosis not present

## 2023-02-01 DIAGNOSIS — R051 Acute cough: Secondary | ICD-10-CM | POA: Diagnosis not present

## 2023-02-01 DIAGNOSIS — Z7901 Long term (current) use of anticoagulants: Secondary | ICD-10-CM | POA: Diagnosis not present

## 2023-02-01 DIAGNOSIS — I10 Essential (primary) hypertension: Secondary | ICD-10-CM | POA: Diagnosis not present

## 2023-02-01 DIAGNOSIS — R2681 Unsteadiness on feet: Secondary | ICD-10-CM | POA: Diagnosis not present

## 2023-02-01 DIAGNOSIS — R7989 Other specified abnormal findings of blood chemistry: Secondary | ICD-10-CM | POA: Diagnosis not present

## 2023-02-01 DIAGNOSIS — Z6826 Body mass index (BMI) 26.0-26.9, adult: Secondary | ICD-10-CM | POA: Diagnosis not present

## 2023-02-01 DIAGNOSIS — R3915 Urgency of urination: Secondary | ICD-10-CM | POA: Diagnosis not present

## 2023-02-01 DIAGNOSIS — S7291XD Unspecified fracture of right femur, subsequent encounter for closed fracture with routine healing: Secondary | ICD-10-CM | POA: Diagnosis not present

## 2023-02-01 DIAGNOSIS — Z9189 Other specified personal risk factors, not elsewhere classified: Secondary | ICD-10-CM | POA: Diagnosis not present

## 2023-02-01 DIAGNOSIS — Z9181 History of falling: Secondary | ICD-10-CM | POA: Diagnosis not present

## 2023-02-01 DIAGNOSIS — S72141D Displaced intertrochanteric fracture of right femur, subsequent encounter for closed fracture with routine healing: Secondary | ICD-10-CM | POA: Diagnosis not present

## 2023-02-01 DIAGNOSIS — Z86718 Personal history of other venous thrombosis and embolism: Secondary | ICD-10-CM | POA: Diagnosis not present

## 2023-02-01 DIAGNOSIS — T8484XD Pain due to internal orthopedic prosthetic devices, implants and grafts, subsequent encounter: Secondary | ICD-10-CM | POA: Diagnosis not present

## 2023-02-01 DIAGNOSIS — M4986 Spondylopathy in diseases classified elsewhere, lumbar region: Secondary | ICD-10-CM | POA: Diagnosis not present

## 2023-02-01 DIAGNOSIS — I129 Hypertensive chronic kidney disease with stage 1 through stage 4 chronic kidney disease, or unspecified chronic kidney disease: Secondary | ICD-10-CM | POA: Diagnosis not present

## 2023-02-01 DIAGNOSIS — Z7189 Other specified counseling: Secondary | ICD-10-CM | POA: Diagnosis not present

## 2023-02-01 DIAGNOSIS — R52 Pain, unspecified: Secondary | ICD-10-CM | POA: Diagnosis not present

## 2023-02-01 DIAGNOSIS — J302 Other seasonal allergic rhinitis: Secondary | ICD-10-CM | POA: Diagnosis not present

## 2023-02-01 DIAGNOSIS — R059 Cough, unspecified: Secondary | ICD-10-CM | POA: Diagnosis not present

## 2023-02-01 DIAGNOSIS — D631 Anemia in chronic kidney disease: Secondary | ICD-10-CM | POA: Diagnosis not present

## 2023-02-01 DIAGNOSIS — R110 Nausea: Secondary | ICD-10-CM | POA: Diagnosis not present

## 2023-02-01 DIAGNOSIS — M4696 Unspecified inflammatory spondylopathy, lumbar region: Secondary | ICD-10-CM | POA: Diagnosis not present

## 2023-02-01 DIAGNOSIS — R0989 Other specified symptoms and signs involving the circulatory and respiratory systems: Secondary | ICD-10-CM | POA: Diagnosis not present

## 2023-02-01 DIAGNOSIS — N401 Enlarged prostate with lower urinary tract symptoms: Secondary | ICD-10-CM | POA: Diagnosis not present

## 2023-02-01 DIAGNOSIS — N1832 Chronic kidney disease, stage 3b: Secondary | ICD-10-CM | POA: Diagnosis not present

## 2023-02-01 DIAGNOSIS — R41841 Cognitive communication deficit: Secondary | ICD-10-CM | POA: Diagnosis not present

## 2023-02-01 DIAGNOSIS — S72141A Displaced intertrochanteric fracture of right femur, initial encounter for closed fracture: Secondary | ICD-10-CM | POA: Diagnosis not present

## 2023-02-01 DIAGNOSIS — J952 Acute pulmonary insufficiency following nonthoracic surgery: Secondary | ICD-10-CM | POA: Diagnosis not present

## 2023-02-01 DIAGNOSIS — H04123 Dry eye syndrome of bilateral lacrimal glands: Secondary | ICD-10-CM | POA: Diagnosis not present

## 2023-02-01 DIAGNOSIS — Z743 Need for continuous supervision: Secondary | ICD-10-CM | POA: Diagnosis not present

## 2023-02-01 DIAGNOSIS — R0982 Postnasal drip: Secondary | ICD-10-CM | POA: Diagnosis not present

## 2023-02-01 DIAGNOSIS — M6281 Muscle weakness (generalized): Secondary | ICD-10-CM | POA: Diagnosis not present

## 2023-02-01 MED ORDER — VITAMIN D (ERGOCALCIFEROL) 1.25 MG (50000 UNIT) PO CAPS: 50000.0000 [IU] | ORAL_CAPSULE | ORAL | Status: AC

## 2023-02-01 MED ORDER — POLYETHYLENE GLYCOL 3350 17 G PO PACK: 17.0000 g | PACK | Freq: Every day | ORAL | Status: AC

## 2023-02-01 MED ORDER — SENNOSIDES-DOCUSATE SODIUM 8.6-50 MG PO TABS: 1.0000 | ORAL_TABLET | Freq: Two times a day (BID) | ORAL | Status: AC

## 2023-02-01 NOTE — TOC Progression Note (Signed)
Transition of Care [facility]) - Progression Note    Patient Details  Name: Azrael Maddix MRN: [id number] Date of Birth: April 03, 1931  Transition [facility]) CM/SW Contact  Lorri Frederick, LCSW Phone Number: 02/01/2023, 8:55 AM  Clinical Narrative:   CSW spoke with pt and niece/POA [name], on speakerphone.  They would like to accept offer at [facility].  CSW confirmed with [facility] that they can receive pt today.  MD notified.     Expected Discharge Plan: Skilled Nursing Facility Barriers to Discharge: Continued Medical Work up, SNF Pending bed offer  Expected Discharge Plan and Services In-house Referral: Clinical Social Work   Post Acute Care Choice: Skilled Nursing Facility Living arrangements for the past 2 months: Single Family Home                                       Social Determinants of Health (SDOH) Interventions SDOH Screenings   Tobacco Use: Medium Risk (01/28/2023)    Readmission Risk Interventions     No data to display

## 2023-02-01 NOTE — TOC Transition Note (Signed)
Transition of Care [facility]) - CM/SW Discharge Note   Patient Details  Name: Steven Horton MRN: [id number] Date of Birth: 1930-07-21  Transition of Care [facility]) CM/SW Contact:  Lorri Frederick, LCSW Phone Number: 02/01/2023, 10:57 AM   Clinical Narrative:   Pt discharging to [place], room 208P. RN call report to [phone].      Final next level of care: Skilled Nursing Facility Barriers to Discharge: Barriers Resolved   Patient Goals and CMS Choice CMS Medicare.gov Compare Post Acute Care list provided to:: Patient Choice offered to / list presented to : [facility] POA / Guardian [name])  Discharge Placement                Patient chooses bed at: [facility] Patient to be transferred to facility by: [facility] Name of family member notified: niece [name] Patient and family notified of of transfer: 02/01/23  Discharge Plan and Services Additional resources added to the After Visit Summary for   In-house Referral: Clinical Social Work   Post Acute Care Choice: Skilled Nursing Facility                               Social Determinants of Health (SDOH) Interventions SDOH Screenings   Tobacco Use: Medium Risk (01/28/2023)     Readmission Risk Interventions     No data to display

## 2023-02-01 NOTE — Plan of Care (Signed)

## 2023-02-01 NOTE — Discharge Summary (Signed)
Physician Discharge Summary  Steven Horton VWU:[id number] DOB: 03-19-1931 DOA: 01/28/2023  [facility]: [facility]  Admit date: 01/28/2023 Discharge date: 02/01/2023  Admitted From: Home Disposition:  Home  Discharge Condition:Stable CODE STATUS:FULL Diet recommendation: Heart Healthy Brief/Interim Summary: Patient is a 89 year old male with history of hypertension, DVT, hypothyroidism, CKD stage IIIb who presented for the evaluation of right hip and knee pain after a fall at home.  He lives alone, ambulates with the help of cane and walker at baseline.  No history of head injury or loss of concerns.  On presentation ,he was hypertensive.  Lab work showed creatinine of 1.6.  Right pelvis x-ray showed commuted and displaced intertrochanteric femur fracture.  Right knee x-ray showed right knee arthroplasty without complication or acute fracture.  Patient was admitted for further management.  Orthopedics consulted.  Status post ORIF with intramedullary implant on 10/3.  PT/OT evaluation done, recommended SNF on discharge Patient is medically stable for discharge to SNF  Following problems were addressed during the hospitalization:  Comminuted/displaced intertrochanteric right femur fracture: Mechanical fall at home.  Orthopedics consulted.  Status post ORIF.  PT/OT done, recommendation is SNF.  Continue pain medication, bowel regimen.  Follow-up with orthopedics in 2 weeks for x-ray, wound check   History of DVT: On Eliquis   CKD stage IIIb: Creatinine of 1.6.  Currently stable and at baseline   Hypertension: Currently blood pressure stable.  On amlodipine.   Hypothyroidism: On levothyroxine   BPH: On Flomax   Low Vitamin D: Started supplementation   Thrombocytopenia: Most likely chronic, stable   Normocytic anemia: Hemoglobin stable.  Continue monitoring as an outpatient     Discharge Diagnoses:  Principal Problem:   Closed intertrochanteric fracture of hip, right, initial  encounter (HCC) Active Problems:   History of DVT (deep vein thrombosis)   Chronic kidney disease, stage 3b [facility])    Discharge Instructions  Discharge Instructions     Diet - low sodium heart healthy   Complete by: As directed    Discharge instructions   Complete by: As directed    1)Please take prescribed medications as instructed 2)Follow up with orthopedics as an outpatient in 2 weeks.  Name and number of the provider has been attached.   Increase activity slowly   Complete by: As directed       Allergies as of 02/01/2023       Reactions   Prednisone Other (See Comments)   Feeling agitated        Medication List     STOP taking these medications    traMADol 50 MG tablet Commonly known as: ULTRAM   valsartan 160 MG tablet Commonly known as: DIOVAN       TAKE these medications    amLODipine 2.5 MG tablet Commonly known as: NORVASC Take 2.5 mg by mouth daily.   apixaban 2.5 MG Tabs tablet Commonly known as: Eliquis Take 1 tablet (2.5 mg total) by mouth 2 (two) times daily.   aspirin EC 81 MG tablet Take 81 mg by mouth daily.   HYDROcodone-acetaminophen 5-325 MG tablet Commonly known as: NORCO/VICODIN Take 1 tablet by mouth every 4 (four) hours as needed for moderate pain or severe pain.   levothyroxine 75 MCG tablet Commonly known as: SYNTHROID Take 75 mcg by mouth daily before breakfast.   polyethylene glycol 17 g packet Commonly known as: MIRALAX / GLYCOLAX Take 17 g by mouth daily. Start taking on: February 02, 2023   senna-docusate 8.6-50 MG tablet Commonly  known as: Senokot-S Take 1 tablet by mouth 2 (two) times daily.   sodium bicarbonate 650 MG tablet Take 650 mg by mouth 3 (three) times daily.   tamsulosin 0.4 MG Caps capsule Commonly known as: FLOMAX Take 0.4 mg by mouth every evening.   Vitamin D (Ergocalciferol) 1.25 MG (50000 UNIT) Caps capsule Commonly known as: DRISDOL Take 1 capsule (50,000 Units total) by mouth every  Friday. Start taking on: February 05, 2023        Contact information for follow-up providers     Yolonda Kida, MD Follow up in 2 week(s).   Specialty: Orthopedic Surgery Why: For wound re-check, For suture removal Contact information: [address] [place] [address] [phone]              Contact information for after-discharge care     Destination     [facility], [facility] Preferred SNF .   Service: Skilled Nursing Contact information: [address] [place] [address] [phone]                    Allergies  Allergen Reactions   Prednisone Other (See Comments)    Feeling agitated    Consultations: Orthopedics   Procedures/Studies: DG FEMUR, MIN 2 VIEWS RIGHT  Result Date: 01/28/2023 CLINICAL DATA:  Elective surgery. EXAM: RIGHT FEMUR 2 VIEWS COMPARISON:  Preoperative imaging FINDINGS: Four fluoroscopic spot views of the right proximal femur obtained in the operating room. Intramedullary nail with trans trochanteric and distal locking screw fixation traverse proximal femur fracture. Fluoroscopy time 47.9 seconds. Dose 7.46 mGy. IMPRESSION: Intraoperative fluoroscopy during ORIF of proximal femur fracture. Electronically Signed   By: Narda Rutherford M.D.   On: 01/28/2023 19:26   DG C-Arm 1-60 Min-No Report  Result Date: 01/28/2023 Fluoroscopy was utilized by the requesting physician.  No radiographic interpretation.   DG Chest Port 1 View  Result Date: 01/28/2023 CLINICAL DATA:  Fall with hip fracture. EXAM: PORTABLE CHEST 1 VIEW COMPARISON:  08/01/2020 FINDINGS: Lung volumes are low. Upper normal heart size likely accentuated by technique and low lung volumes. Aortic tortuosity and atherosclerosis. Bronchovascular crowding versus vascular congestion. No pneumothorax or pleural effusion. No focal airspace disease. On limited assessment, no acute osseous findings. There is a staple no  region of the right axilla. IMPRESSION: Low lung volumes with bronchovascular crowding versus vascular congestion. Electronically Signed   By: Narda Rutherford M.D.   On: 01/28/2023 16:24   DG Hip Unilat W or Wo Pelvis 2-3 Views Right  Result Date: 01/28/2023 CLINICAL DATA:  Status post fall with right hip pain. EXAM: DG HIP (WITH OR WITHOUT PELVIS) 2-3V RIGHT COMPARISON:  None Available. FINDINGS: Comminuted and displaced intertrochanteric femur fracture. Fracture displacement up to 1 cm. Femoral head is well seated, no hip dislocation. No additional fracture. Bony pelvis including pubic rami are intact. Pubic symphysis and sacroiliac joints are congruent. Mild bilateral hip osteoarthritis. IMPRESSION: Comminuted and displaced intertrochanteric femur fracture. Electronically Signed   By: Narda Rutherford M.D.   On: 01/28/2023 16:23   DG Knee Complete 4 Views Right  Result Date: 01/28/2023 CLINICAL DATA:  Fall with right hip and knee pain. EXAM: RIGHT KNEE - COMPLETE 4+ VIEW COMPARISON:  None Available. FINDINGS: Right knee arthroplasty in expected alignment. Prior patellar resurfacing. No acute or periprosthetic fracture. There is no periprosthetic lucency. Quadriceps and patellar tendon enthesophytes. There is a small joint effusion. Mild soft tissue edema. IMPRESSION: 1. Right knee  arthroplasty without complication or acute fracture. 2. Small joint effusion. Electronically Signed   By: Narda Rutherford M.D.   On: 01/28/2023 16:22      Subjective: Seen and examined at bedside today.  Hemodynamically stable.  Comfortable.  Lying on bed.  Pain well-controlled.  Medically stable for discharge to SNF today.I called and discussed with Niece about dc planning  Discharge Exam: Vitals:   01/31/23 1406 02/01/23 0814  BP: (!) 135/51 (!) 142/58  Pulse: 85 80  Resp: 16 17  Temp: 98.3 F (36.8 C) 98.9 F (37.2 C)  SpO2: 98% 97%   Vitals:   01/31/23 0821 01/31/23 0824 01/31/23 1406 02/01/23 0814  BP:  (!) 130/53 (!) 130/53 (!) 135/51 (!) 142/58  Pulse:  83 85 80  Resp:  18 16 17   Temp:  98.3 F (36.8 C) 98.3 F (36.8 C) 98.9 F (37.2 C)  TempSrc:  Oral Oral Oral  SpO2:  96% 98% 97%  Height:        General: Pt is alert, awake, not in acute distress Cardiovascular: RRR, S1/S2 +, no rubs, no gallops Respiratory: CTA bilaterally, no wheezing, no rhonchi Abdominal: Soft, NT, ND, bowel sounds + Extremities: no edema, no cyanosis, clean surgical wound on the right hip    The results of significant diagnostics from this hospitalization (including imaging, microbiology, ancillary and laboratory) are listed below for reference.     Microbiology: Recent Results (from the past 240 hour(s))  Surgical pcr screen     Status: Abnormal   Collection Time: 01/28/23  4:54 PM   Specimen: Nasal Mucosa; Nasal Swab  Result Value Ref Range Status   MRSA, PCR (A) NEGATIVE Corrected    INVALID, UNABLE TO DETERMINE THE PRESENCE OF TARGET DUE TO SPECIMEN INTEGRITY. RECOLLECTION REQUESTED.   Staphylococcus aureus (A) NEGATIVE Corrected    INVALID, UNABLE TO DETERMINE THE PRESENCE OF TARGET DUE TO SPECIMEN INTEGRITY. RECOLLECTION REQUESTED.    Comment: Performed at [facility], [address], [place], [place] [address] CORRECTED ON 10/03 AT 2043: PREVIOUSLY REPORTED AS INVALID, UNABLE TO DETERMINE THE PRESENCE OF TARGET DUE TO SPECIMEN INTEGRITY. RECOLLECTION REQUESTED. emailed Lindsi Forte on 01/28/23 @ 2042 by drt      Labs: BNP (last 3 results) No results for input(s): "BNP" in the last 8760 hours. Basic Metabolic Panel: Recent Labs  Lab 01/28/23 1440 01/29/23 0346 01/30/23 1020  NA 141 136 136  K 4.2 4.2 4.5  CL 108 104 104  CO2 19* 21* 20*  GLUCOSE 102* 147* 108*  BUN 28* 27* 31*  CREATININE 1.60* 1.63* 1.69*  CALCIUM 8.9 8.0* 8.3*   Liver Function Tests: No results for input(s): "AST", "ALT", "ALKPHOS", "BILITOT", "PROT", "ALBUMIN" in the last 168 hours. No results for  input(s): "LIPASE", "AMYLASE" in the last 168 hours. No results for input(s): "AMMONIA" in the last 168 hours. CBC: Recent Labs  Lab 01/28/23 1440 01/29/23 0346 01/30/23 1020 01/31/23 0806  WBC 6.7 8.4 8.6 7.9  NEUTROABS 4.7  --   --   --   HGB 15.2 12.8* 11.8* 11.2*  HCT 44.4 37.7* 35.3* 34.1*  MCV 96.3 98.7 95.7 96.6  PLT 146* 146* 139* 136*   Cardiac Enzymes: No results for input(s): "CKTOTAL", "CKMB", "CKMBINDEX", "TROPONINI" in the last 168 hours. BNP: Invalid input(s): "POCBNP" CBG: No results for input(s): "GLUCAP" in the last 168 hours. D-Dimer No results for input(s): "DDIMER" in the last 72 hours. Hgb A1c No results for input(s): "HGBA1C" in the  last 72 hours. Lipid Profile No results for input(s): "CHOL", "HDL", "LDLCALC", "TRIG", "CHOLHDL", "LDLDIRECT" in the last 72 hours. Thyroid function studies No results for input(s): "TSH", "T4TOTAL", "T3FREE", "THYROIDAB" in the last 72 hours.  Invalid input(s): "FREET3" Anemia work up No results for input(s): "VITAMINB12", "FOLATE", "FERRITIN", "TIBC", "IRON", "RETICCTPCT" in the last 72 hours. Urinalysis    Component Value Date/Time   COLORURINE YELLOW 08/01/2020 2148   APPEARANCEUR CLEAR 08/01/2020 2148   LABSPEC 1.014 08/01/2020 2148   PHURINE 5.0 08/01/2020 2148   GLUCOSEU NEGATIVE 08/01/2020 2148   HGBUR NEGATIVE 08/01/2020 2148   BILIRUBINUR NEGATIVE 08/01/2020 2148   KETONESUR NEGATIVE 08/01/2020 2148   PROTEINUR NEGATIVE 08/01/2020 2148   NITRITE NEGATIVE 08/01/2020 2148   LEUKOCYTESUR NEGATIVE 08/01/2020 2148   Sepsis Labs Recent Labs  Lab 01/28/23 1440 01/29/23 0346 01/30/23 1020 01/31/23 0806  WBC 6.7 8.4 8.6 7.9   Microbiology Recent Results (from the past 240 hour(s))  Surgical pcr screen     Status: Abnormal   Collection Time: 01/28/23  4:54 PM   Specimen: Nasal Mucosa; Nasal Swab  Result Value Ref Range Status   MRSA, PCR (A) NEGATIVE Corrected    INVALID, UNABLE TO DETERMINE THE  PRESENCE OF TARGET DUE TO SPECIMEN INTEGRITY. RECOLLECTION REQUESTED.   Staphylococcus aureus (A) NEGATIVE Corrected    INVALID, UNABLE TO DETERMINE THE PRESENCE OF TARGET DUE TO SPECIMEN INTEGRITY. RECOLLECTION REQUESTED.    Comment: Performed at [facility], [address] [place], [place] [address] CORRECTED ON 10/03 AT 2043: PREVIOUSLY REPORTED AS INVALID, UNABLE TO DETERMINE THE PRESENCE OF TARGET DUE TO SPECIMEN INTEGRITY. RECOLLECTION REQUESTED. emailed Lindsi Forte on 01/28/23 @ 2042 by drt     Please note: You were cared for by a hospitalist during your hospital stay. Once you are discharged, your primary care physician will handle any further medical issues. Please note that NO REFILLS for any discharge medications will be authorized once you are discharged, as it is imperative that you return to your primary care physician (or establish a relationship with a primary care physician if you do not have one) for your post hospital discharge needs so that they can reassess your need for medications and monitor your lab values.    Time coordinating discharge: 40 minutes  SIGNED:   Burnadette Pop, MD  [facility] 02/01/2023, 10:31 AM Pager [phone]  If 7PM-7AM, please contact night-coverage [email or web address] Password [email or web address]

## 2023-02-01 NOTE — Care Management Important Message (Signed)
Important Message  Patient Details  Name: Khadim Lundberg MRN: [id number] Date of Birth: 12-23-1930   Important Message Given:  Yes - Medicare IM     Sherilyn Banker 02/01/2023, 2:19 PM

## 2023-02-01 NOTE — Discharge Planning (Signed)
Patient oriented. IV access removed. Patient transported to [facility] via [place]. Report called to Morrie Sheldon, LPN at [facility].

## 2023-02-02 DIAGNOSIS — N1832 Chronic kidney disease, stage 3b: Secondary | ICD-10-CM | POA: Diagnosis not present

## 2023-02-02 DIAGNOSIS — Z7901 Long term (current) use of anticoagulants: Secondary | ICD-10-CM | POA: Diagnosis not present

## 2023-02-02 DIAGNOSIS — S7291XD Unspecified fracture of right femur, subsequent encounter for closed fracture with routine healing: Secondary | ICD-10-CM | POA: Diagnosis not present

## 2023-02-02 DIAGNOSIS — E039 Hypothyroidism, unspecified: Secondary | ICD-10-CM | POA: Diagnosis not present

## 2023-02-02 DIAGNOSIS — I129 Hypertensive chronic kidney disease with stage 1 through stage 4 chronic kidney disease, or unspecified chronic kidney disease: Secondary | ICD-10-CM | POA: Diagnosis not present

## 2023-02-02 DIAGNOSIS — Z9181 History of falling: Secondary | ICD-10-CM | POA: Diagnosis not present

## 2023-02-02 DIAGNOSIS — Z86718 Personal history of other venous thrombosis and embolism: Secondary | ICD-10-CM | POA: Diagnosis not present

## 2023-02-04 DIAGNOSIS — I129 Hypertensive chronic kidney disease with stage 1 through stage 4 chronic kidney disease, or unspecified chronic kidney disease: Secondary | ICD-10-CM | POA: Diagnosis not present

## 2023-02-04 DIAGNOSIS — R051 Acute cough: Secondary | ICD-10-CM | POA: Diagnosis not present

## 2023-02-04 DIAGNOSIS — D631 Anemia in chronic kidney disease: Secondary | ICD-10-CM | POA: Diagnosis not present

## 2023-02-04 DIAGNOSIS — S72141D Displaced intertrochanteric fracture of right femur, subsequent encounter for closed fracture with routine healing: Secondary | ICD-10-CM | POA: Diagnosis not present

## 2023-02-04 DIAGNOSIS — R3915 Urgency of urination: Secondary | ICD-10-CM | POA: Diagnosis not present

## 2023-02-04 DIAGNOSIS — N1832 Chronic kidney disease, stage 3b: Secondary | ICD-10-CM | POA: Diagnosis not present

## 2023-02-04 DIAGNOSIS — Z9189 Other specified personal risk factors, not elsewhere classified: Secondary | ICD-10-CM | POA: Diagnosis not present

## 2023-02-04 DIAGNOSIS — M4986 Spondylopathy in diseases classified elsewhere, lumbar region: Secondary | ICD-10-CM | POA: Diagnosis not present

## 2023-02-04 DIAGNOSIS — D8481 Immunodeficiency due to conditions classified elsewhere: Secondary | ICD-10-CM | POA: Diagnosis not present

## 2023-02-04 DIAGNOSIS — R7989 Other specified abnormal findings of blood chemistry: Secondary | ICD-10-CM | POA: Diagnosis not present

## 2023-02-04 DIAGNOSIS — Z6826 Body mass index (BMI) 26.0-26.9, adult: Secondary | ICD-10-CM | POA: Diagnosis not present

## 2023-02-04 DIAGNOSIS — M47816 Spondylosis without myelopathy or radiculopathy, lumbar region: Secondary | ICD-10-CM | POA: Diagnosis not present

## 2023-02-04 DIAGNOSIS — D696 Thrombocytopenia, unspecified: Secondary | ICD-10-CM | POA: Diagnosis not present

## 2023-02-04 DIAGNOSIS — Z7901 Long term (current) use of anticoagulants: Secondary | ICD-10-CM | POA: Diagnosis not present

## 2023-02-04 DIAGNOSIS — J952 Acute pulmonary insufficiency following nonthoracic surgery: Secondary | ICD-10-CM | POA: Diagnosis not present

## 2023-02-04 DIAGNOSIS — N401 Enlarged prostate with lower urinary tract symptoms: Secondary | ICD-10-CM | POA: Diagnosis not present

## 2023-02-04 DIAGNOSIS — E039 Hypothyroidism, unspecified: Secondary | ICD-10-CM | POA: Diagnosis not present

## 2023-02-04 DIAGNOSIS — Z9181 History of falling: Secondary | ICD-10-CM | POA: Diagnosis not present

## 2023-02-04 DIAGNOSIS — Z86718 Personal history of other venous thrombosis and embolism: Secondary | ICD-10-CM | POA: Diagnosis not present

## 2023-02-09 DIAGNOSIS — M6281 Muscle weakness (generalized): Secondary | ICD-10-CM | POA: Diagnosis not present

## 2023-02-09 DIAGNOSIS — R52 Pain, unspecified: Secondary | ICD-10-CM | POA: Diagnosis not present

## 2023-02-09 DIAGNOSIS — R2681 Unsteadiness on feet: Secondary | ICD-10-CM | POA: Diagnosis not present

## 2023-02-09 DIAGNOSIS — Z9181 History of falling: Secondary | ICD-10-CM | POA: Diagnosis not present

## 2023-02-09 DIAGNOSIS — S72141D Displaced intertrochanteric fracture of right femur, subsequent encounter for closed fracture with routine healing: Secondary | ICD-10-CM | POA: Diagnosis not present

## 2023-02-10 DIAGNOSIS — Z7189 Other specified counseling: Secondary | ICD-10-CM | POA: Diagnosis not present

## 2023-02-10 DIAGNOSIS — F32A Depression, unspecified: Secondary | ICD-10-CM | POA: Diagnosis not present

## 2023-02-10 DIAGNOSIS — Z86718 Personal history of other venous thrombosis and embolism: Secondary | ICD-10-CM | POA: Diagnosis not present

## 2023-02-10 DIAGNOSIS — D631 Anemia in chronic kidney disease: Secondary | ICD-10-CM | POA: Diagnosis not present

## 2023-02-10 DIAGNOSIS — S72141D Displaced intertrochanteric fracture of right femur, subsequent encounter for closed fracture with routine healing: Secondary | ICD-10-CM | POA: Diagnosis not present

## 2023-02-10 DIAGNOSIS — H04123 Dry eye syndrome of bilateral lacrimal glands: Secondary | ICD-10-CM | POA: Diagnosis not present

## 2023-02-10 DIAGNOSIS — N1832 Chronic kidney disease, stage 3b: Secondary | ICD-10-CM | POA: Diagnosis not present

## 2023-02-11 DIAGNOSIS — Z9181 History of falling: Secondary | ICD-10-CM | POA: Diagnosis not present

## 2023-02-11 DIAGNOSIS — R2681 Unsteadiness on feet: Secondary | ICD-10-CM | POA: Diagnosis not present

## 2023-02-11 DIAGNOSIS — M6281 Muscle weakness (generalized): Secondary | ICD-10-CM | POA: Diagnosis not present

## 2023-02-11 DIAGNOSIS — S72141D Displaced intertrochanteric fracture of right femur, subsequent encounter for closed fracture with routine healing: Secondary | ICD-10-CM | POA: Diagnosis not present

## 2023-02-11 DIAGNOSIS — R52 Pain, unspecified: Secondary | ICD-10-CM | POA: Diagnosis not present

## 2023-02-15 DIAGNOSIS — S72141D Displaced intertrochanteric fracture of right femur, subsequent encounter for closed fracture with routine healing: Secondary | ICD-10-CM | POA: Diagnosis not present

## 2023-02-15 DIAGNOSIS — R2681 Unsteadiness on feet: Secondary | ICD-10-CM | POA: Diagnosis not present

## 2023-02-15 DIAGNOSIS — Z9181 History of falling: Secondary | ICD-10-CM | POA: Diagnosis not present

## 2023-02-15 DIAGNOSIS — R52 Pain, unspecified: Secondary | ICD-10-CM | POA: Diagnosis not present

## 2023-02-15 DIAGNOSIS — M6281 Muscle weakness (generalized): Secondary | ICD-10-CM | POA: Diagnosis not present

## 2023-02-16 DIAGNOSIS — Z4789 Encounter for other orthopedic aftercare: Secondary | ICD-10-CM | POA: Diagnosis not present

## 2023-02-18 DIAGNOSIS — M6281 Muscle weakness (generalized): Secondary | ICD-10-CM | POA: Diagnosis not present

## 2023-02-18 DIAGNOSIS — R52 Pain, unspecified: Secondary | ICD-10-CM | POA: Diagnosis not present

## 2023-02-18 DIAGNOSIS — Z9181 History of falling: Secondary | ICD-10-CM | POA: Diagnosis not present

## 2023-02-18 DIAGNOSIS — R2681 Unsteadiness on feet: Secondary | ICD-10-CM | POA: Diagnosis not present

## 2023-02-18 DIAGNOSIS — S72141D Displaced intertrochanteric fracture of right femur, subsequent encounter for closed fracture with routine healing: Secondary | ICD-10-CM | POA: Diagnosis not present

## 2023-02-19 DIAGNOSIS — I10 Essential (primary) hypertension: Secondary | ICD-10-CM | POA: Diagnosis not present

## 2023-02-19 DIAGNOSIS — Z9181 History of falling: Secondary | ICD-10-CM | POA: Diagnosis not present

## 2023-02-19 DIAGNOSIS — R051 Acute cough: Secondary | ICD-10-CM | POA: Diagnosis not present

## 2023-02-19 DIAGNOSIS — S72141D Displaced intertrochanteric fracture of right femur, subsequent encounter for closed fracture with routine healing: Secondary | ICD-10-CM | POA: Diagnosis not present

## 2023-02-19 DIAGNOSIS — R0982 Postnasal drip: Secondary | ICD-10-CM | POA: Diagnosis not present

## 2023-02-22 DIAGNOSIS — Z9181 History of falling: Secondary | ICD-10-CM | POA: Diagnosis not present

## 2023-02-22 DIAGNOSIS — R2681 Unsteadiness on feet: Secondary | ICD-10-CM | POA: Diagnosis not present

## 2023-02-22 DIAGNOSIS — S72141D Displaced intertrochanteric fracture of right femur, subsequent encounter for closed fracture with routine healing: Secondary | ICD-10-CM | POA: Diagnosis not present

## 2023-02-22 DIAGNOSIS — R52 Pain, unspecified: Secondary | ICD-10-CM | POA: Diagnosis not present

## 2023-02-22 DIAGNOSIS — M6281 Muscle weakness (generalized): Secondary | ICD-10-CM | POA: Diagnosis not present

## 2023-02-22 DIAGNOSIS — R110 Nausea: Secondary | ICD-10-CM | POA: Diagnosis not present

## 2023-02-23 DIAGNOSIS — R051 Acute cough: Secondary | ICD-10-CM | POA: Diagnosis not present

## 2023-02-24 DIAGNOSIS — M6281 Muscle weakness (generalized): Secondary | ICD-10-CM | POA: Diagnosis not present

## 2023-02-24 DIAGNOSIS — S72141D Displaced intertrochanteric fracture of right femur, subsequent encounter for closed fracture with routine healing: Secondary | ICD-10-CM | POA: Diagnosis not present

## 2023-02-24 DIAGNOSIS — R52 Pain, unspecified: Secondary | ICD-10-CM | POA: Diagnosis not present

## 2023-02-24 DIAGNOSIS — R2681 Unsteadiness on feet: Secondary | ICD-10-CM | POA: Diagnosis not present

## 2023-02-24 DIAGNOSIS — Z9181 History of falling: Secondary | ICD-10-CM | POA: Diagnosis not present

## 2023-02-24 DIAGNOSIS — R110 Nausea: Secondary | ICD-10-CM | POA: Diagnosis not present

## 2023-02-25 ENCOUNTER — Other Ambulatory Visit: Payer: Self-pay | Admitting: *Deleted

## 2023-02-25 NOTE — Patient Outreach (Signed)
Mr. Steven Horton resides in [facility].  Screening for potential chronic care management services as a benefit of health plan and primary care provider.  Collaboration facility visit at [facility]. Met with therapy manager and social work team. Mr. Steven Horton is from home alone. He is hard of hearing. Wears hearing aides. Requires max assist with lower body dressing currently. He is progressing with ambulation and making gains in other areas of therapy. No local family. Niece and nephew live in [place]. Has church friends that visit him. SW and therapy manager to speak with niece to discuss anticipated transition plans/options.   Spoke with Mr. Steven Horton to discuss/explain chronic care management services. Mr. Steven Horton is agreeable. Confirms best contact number is [phone]. Mr. Steven Horton reports he may have additional help when he returns home.   Will continue to follow for transition plans/ needs.   Raiford Noble, MSN, RN, BSN [place]  [facility], Healthy Communities RN Post- Acute Care Coordinator Direct Dial: [phone]

## 2023-03-02 DIAGNOSIS — Z9181 History of falling: Secondary | ICD-10-CM | POA: Diagnosis not present

## 2023-03-02 DIAGNOSIS — S72141D Displaced intertrochanteric fracture of right femur, subsequent encounter for closed fracture with routine healing: Secondary | ICD-10-CM | POA: Diagnosis not present

## 2023-03-02 DIAGNOSIS — F32A Depression, unspecified: Secondary | ICD-10-CM | POA: Diagnosis not present

## 2023-03-02 DIAGNOSIS — I129 Hypertensive chronic kidney disease with stage 1 through stage 4 chronic kidney disease, or unspecified chronic kidney disease: Secondary | ICD-10-CM | POA: Diagnosis not present

## 2023-03-02 DIAGNOSIS — E039 Hypothyroidism, unspecified: Secondary | ICD-10-CM | POA: Diagnosis not present

## 2023-03-02 DIAGNOSIS — N1832 Chronic kidney disease, stage 3b: Secondary | ICD-10-CM | POA: Diagnosis not present

## 2023-03-08 DIAGNOSIS — Z9181 History of falling: Secondary | ICD-10-CM | POA: Diagnosis not present

## 2023-03-08 DIAGNOSIS — R2681 Unsteadiness on feet: Secondary | ICD-10-CM | POA: Diagnosis not present

## 2023-03-08 DIAGNOSIS — S72141D Displaced intertrochanteric fracture of right femur, subsequent encounter for closed fracture with routine healing: Secondary | ICD-10-CM | POA: Diagnosis not present

## 2023-03-08 DIAGNOSIS — R110 Nausea: Secondary | ICD-10-CM | POA: Diagnosis not present

## 2023-03-08 DIAGNOSIS — R52 Pain, unspecified: Secondary | ICD-10-CM | POA: Diagnosis not present

## 2023-03-08 DIAGNOSIS — M6281 Muscle weakness (generalized): Secondary | ICD-10-CM | POA: Diagnosis not present

## 2023-03-10 DIAGNOSIS — Z9181 History of falling: Secondary | ICD-10-CM | POA: Diagnosis not present

## 2023-03-10 DIAGNOSIS — R52 Pain, unspecified: Secondary | ICD-10-CM | POA: Diagnosis not present

## 2023-03-10 DIAGNOSIS — S72141D Displaced intertrochanteric fracture of right femur, subsequent encounter for closed fracture with routine healing: Secondary | ICD-10-CM | POA: Diagnosis not present

## 2023-03-10 DIAGNOSIS — R110 Nausea: Secondary | ICD-10-CM | POA: Diagnosis not present

## 2023-03-10 DIAGNOSIS — M6281 Muscle weakness (generalized): Secondary | ICD-10-CM | POA: Diagnosis not present

## 2023-03-10 DIAGNOSIS — R2681 Unsteadiness on feet: Secondary | ICD-10-CM | POA: Diagnosis not present

## 2023-03-15 DIAGNOSIS — S72141D Displaced intertrochanteric fracture of right femur, subsequent encounter for closed fracture with routine healing: Secondary | ICD-10-CM | POA: Diagnosis not present

## 2023-03-15 DIAGNOSIS — R2681 Unsteadiness on feet: Secondary | ICD-10-CM | POA: Diagnosis not present

## 2023-03-15 DIAGNOSIS — M6281 Muscle weakness (generalized): Secondary | ICD-10-CM | POA: Diagnosis not present

## 2023-03-15 DIAGNOSIS — R52 Pain, unspecified: Secondary | ICD-10-CM | POA: Diagnosis not present

## 2023-03-15 DIAGNOSIS — Z9181 History of falling: Secondary | ICD-10-CM | POA: Diagnosis not present

## 2023-03-15 DIAGNOSIS — M1712 Unilateral primary osteoarthritis, left knee: Secondary | ICD-10-CM | POA: Diagnosis not present

## 2023-03-17 DIAGNOSIS — M1712 Unilateral primary osteoarthritis, left knee: Secondary | ICD-10-CM | POA: Diagnosis not present

## 2023-03-17 DIAGNOSIS — M6281 Muscle weakness (generalized): Secondary | ICD-10-CM | POA: Diagnosis not present

## 2023-03-17 DIAGNOSIS — R52 Pain, unspecified: Secondary | ICD-10-CM | POA: Diagnosis not present

## 2023-03-17 DIAGNOSIS — R2681 Unsteadiness on feet: Secondary | ICD-10-CM | POA: Diagnosis not present

## 2023-03-17 DIAGNOSIS — S72141D Displaced intertrochanteric fracture of right femur, subsequent encounter for closed fracture with routine healing: Secondary | ICD-10-CM | POA: Diagnosis not present

## 2023-03-17 DIAGNOSIS — Z9181 History of falling: Secondary | ICD-10-CM | POA: Diagnosis not present

## 2023-03-18 ENCOUNTER — Other Ambulatory Visit: Payer: Self-pay | Admitting: *Deleted

## 2023-03-18 NOTE — Patient Outreach (Signed)
Post- Acute Care Manager follow up. Mr. Steven Horton resides in [facility] skilled [facility] facility.  Screening for potential chronic care management services as a benefit of health plan and primary care provider.  Facility site visit to [facility]. Met with Steven Horton, Child psychotherapist and Steven Horton, Sales promotion account executive. Steven Horton will return home alone. Will have caregivers in place thru [employer] 7 days a week from 10-2 pm. Will have [facility] home [facility]. Niece and nephew plan to come from [place] upon SNF discharge. Likely to return home soon.   Went to bedside to speak with Steven Horton. He was off the unit.   Steven Horton previously agreed to CCM follow up. Will plan to refer to chronic care management team upon SNF discharge.   Steven Noble, MSN, RN, BSN [place]  [facility], Healthy Communities RN Post- Acute Care Manager Direct Dial: [phone]

## 2023-03-19 DIAGNOSIS — S72141D Displaced intertrochanteric fracture of right femur, subsequent encounter for closed fracture with routine healing: Secondary | ICD-10-CM | POA: Diagnosis not present

## 2023-03-19 DIAGNOSIS — J952 Acute pulmonary insufficiency following nonthoracic surgery: Secondary | ICD-10-CM | POA: Diagnosis not present

## 2023-03-19 DIAGNOSIS — R0982 Postnasal drip: Secondary | ICD-10-CM | POA: Diagnosis not present

## 2023-03-19 DIAGNOSIS — R509 Fever, unspecified: Secondary | ICD-10-CM | POA: Diagnosis not present

## 2023-03-19 DIAGNOSIS — R051 Acute cough: Secondary | ICD-10-CM | POA: Diagnosis not present

## 2023-03-22 DIAGNOSIS — S72141D Displaced intertrochanteric fracture of right femur, subsequent encounter for closed fracture with routine healing: Secondary | ICD-10-CM | POA: Diagnosis not present

## 2023-03-22 DIAGNOSIS — Z9181 History of falling: Secondary | ICD-10-CM | POA: Diagnosis not present

## 2023-03-22 DIAGNOSIS — M1712 Unilateral primary osteoarthritis, left knee: Secondary | ICD-10-CM | POA: Diagnosis not present

## 2023-03-22 DIAGNOSIS — M6281 Muscle weakness (generalized): Secondary | ICD-10-CM | POA: Diagnosis not present

## 2023-03-22 DIAGNOSIS — R2681 Unsteadiness on feet: Secondary | ICD-10-CM | POA: Diagnosis not present

## 2023-03-22 DIAGNOSIS — R52 Pain, unspecified: Secondary | ICD-10-CM | POA: Diagnosis not present

## 2023-03-24 DIAGNOSIS — M1712 Unilateral primary osteoarthritis, left knee: Secondary | ICD-10-CM | POA: Diagnosis not present

## 2023-03-24 DIAGNOSIS — R2681 Unsteadiness on feet: Secondary | ICD-10-CM | POA: Diagnosis not present

## 2023-03-24 DIAGNOSIS — M6281 Muscle weakness (generalized): Secondary | ICD-10-CM | POA: Diagnosis not present

## 2023-03-24 DIAGNOSIS — R52 Pain, unspecified: Secondary | ICD-10-CM | POA: Diagnosis not present

## 2023-03-24 DIAGNOSIS — Z9181 History of falling: Secondary | ICD-10-CM | POA: Diagnosis not present

## 2023-03-24 DIAGNOSIS — S72141D Displaced intertrochanteric fracture of right femur, subsequent encounter for closed fracture with routine healing: Secondary | ICD-10-CM | POA: Diagnosis not present

## 2023-03-29 DIAGNOSIS — Z86718 Personal history of other venous thrombosis and embolism: Secondary | ICD-10-CM | POA: Diagnosis not present

## 2023-03-29 DIAGNOSIS — Z7901 Long term (current) use of anticoagulants: Secondary | ICD-10-CM | POA: Diagnosis not present

## 2023-03-29 DIAGNOSIS — M1712 Unilateral primary osteoarthritis, left knee: Secondary | ICD-10-CM | POA: Diagnosis not present

## 2023-03-29 DIAGNOSIS — N1832 Chronic kidney disease, stage 3b: Secondary | ICD-10-CM | POA: Diagnosis not present

## 2023-03-29 DIAGNOSIS — I129 Hypertensive chronic kidney disease with stage 1 through stage 4 chronic kidney disease, or unspecified chronic kidney disease: Secondary | ICD-10-CM | POA: Diagnosis not present

## 2023-03-29 DIAGNOSIS — J302 Other seasonal allergic rhinitis: Secondary | ICD-10-CM | POA: Diagnosis not present

## 2023-03-29 DIAGNOSIS — S72141D Displaced intertrochanteric fracture of right femur, subsequent encounter for closed fracture with routine healing: Secondary | ICD-10-CM | POA: Diagnosis not present

## 2023-03-29 DIAGNOSIS — N400 Enlarged prostate without lower urinary tract symptoms: Secondary | ICD-10-CM | POA: Diagnosis not present

## 2023-03-29 DIAGNOSIS — R7989 Other specified abnormal findings of blood chemistry: Secondary | ICD-10-CM | POA: Diagnosis not present

## 2023-03-29 DIAGNOSIS — E039 Hypothyroidism, unspecified: Secondary | ICD-10-CM | POA: Diagnosis not present

## 2023-03-29 DIAGNOSIS — R2681 Unsteadiness on feet: Secondary | ICD-10-CM | POA: Diagnosis not present

## 2023-03-29 DIAGNOSIS — Z9181 History of falling: Secondary | ICD-10-CM | POA: Diagnosis not present

## 2023-03-29 DIAGNOSIS — M6281 Muscle weakness (generalized): Secondary | ICD-10-CM | POA: Diagnosis not present

## 2023-03-29 DIAGNOSIS — R52 Pain, unspecified: Secondary | ICD-10-CM | POA: Diagnosis not present

## 2023-03-30 DIAGNOSIS — Z4789 Encounter for other orthopedic aftercare: Secondary | ICD-10-CM | POA: Diagnosis not present

## 2023-04-01 DIAGNOSIS — R3915 Urgency of urination: Secondary | ICD-10-CM | POA: Diagnosis not present

## 2023-04-01 DIAGNOSIS — Z7901 Long term (current) use of anticoagulants: Secondary | ICD-10-CM | POA: Diagnosis not present

## 2023-04-01 DIAGNOSIS — E039 Hypothyroidism, unspecified: Secondary | ICD-10-CM | POA: Diagnosis not present

## 2023-04-01 DIAGNOSIS — F32A Depression, unspecified: Secondary | ICD-10-CM | POA: Diagnosis not present

## 2023-04-01 DIAGNOSIS — S72141D Displaced intertrochanteric fracture of right femur, subsequent encounter for closed fracture with routine healing: Secondary | ICD-10-CM | POA: Diagnosis not present

## 2023-04-01 DIAGNOSIS — S72144D Nondisplaced intertrochanteric fracture of right femur, subsequent encounter for closed fracture with routine healing: Secondary | ICD-10-CM | POA: Diagnosis not present

## 2023-04-01 DIAGNOSIS — N401 Enlarged prostate with lower urinary tract symptoms: Secondary | ICD-10-CM | POA: Diagnosis not present

## 2023-04-01 DIAGNOSIS — Z86718 Personal history of other venous thrombosis and embolism: Secondary | ICD-10-CM | POA: Diagnosis not present

## 2023-04-01 DIAGNOSIS — I700 Atherosclerosis of aorta: Secondary | ICD-10-CM | POA: Diagnosis not present

## 2023-04-01 DIAGNOSIS — N1832 Chronic kidney disease, stage 3b: Secondary | ICD-10-CM | POA: Diagnosis not present

## 2023-04-01 DIAGNOSIS — Z87891 Personal history of nicotine dependence: Secondary | ICD-10-CM | POA: Diagnosis not present

## 2023-04-01 DIAGNOSIS — D631 Anemia in chronic kidney disease: Secondary | ICD-10-CM | POA: Diagnosis not present

## 2023-04-01 DIAGNOSIS — I129 Hypertensive chronic kidney disease with stage 1 through stage 4 chronic kidney disease, or unspecified chronic kidney disease: Secondary | ICD-10-CM | POA: Diagnosis not present

## 2023-04-06 ENCOUNTER — Other Ambulatory Visit: Payer: Self-pay | Admitting: *Deleted

## 2023-04-06 DIAGNOSIS — I10 Essential (primary) hypertension: Secondary | ICD-10-CM

## 2023-04-06 NOTE — Patient Outreach (Signed)
Post- Acute Care Manager follow up.   Confirmed with Kirstin, Production assistant, radio. Steven Horton discharged from [facility] on 04/02/23. He returned home alone with caregiver assistance thru [employer]. Will have [facility] home [facility]. Steven Horton previously agreed to CCM follow up.   PCP is also Dr. Tenny Craw with [facility].   Steven Horton has history HTN, DVT, femur fracture, s/p ORIF, prolonged SNF stay.  Will refer to CCM team for RN CM services.   Steven Noble, MSN, RN, BSN [place]  [facility], Healthy Communities RN Post- Acute Care Manager Direct Dial: [phone]

## 2023-04-07 DIAGNOSIS — D631 Anemia in chronic kidney disease: Secondary | ICD-10-CM | POA: Diagnosis not present

## 2023-04-07 DIAGNOSIS — S72141D Displaced intertrochanteric fracture of right femur, subsequent encounter for closed fracture with routine healing: Secondary | ICD-10-CM | POA: Diagnosis not present

## 2023-04-07 DIAGNOSIS — F32A Depression, unspecified: Secondary | ICD-10-CM | POA: Diagnosis not present

## 2023-04-07 DIAGNOSIS — N1832 Chronic kidney disease, stage 3b: Secondary | ICD-10-CM | POA: Diagnosis not present

## 2023-04-07 DIAGNOSIS — I129 Hypertensive chronic kidney disease with stage 1 through stage 4 chronic kidney disease, or unspecified chronic kidney disease: Secondary | ICD-10-CM | POA: Diagnosis not present

## 2023-04-07 DIAGNOSIS — I700 Atherosclerosis of aorta: Secondary | ICD-10-CM | POA: Diagnosis not present

## 2023-04-08 ENCOUNTER — Telehealth: Payer: Self-pay | Admitting: *Deleted

## 2023-04-08 NOTE — Progress Notes (Signed)
  Care Coordination  Outreach Note  04/08/2023 Name: Steven Horton MRN: [id number] DOB: 1930-07-21   Care Coordination Outreach Attempts: An unsuccessful telephone outreach was attempted today to offer the patient information about available care coordination services.  Follow Up Plan:  Additional outreach attempts will be made to offer the patient care coordination information and services.   Encounter Outcome:  No Answer  Burman Nieves, CCMA Care Coordination Care Guide Direct Dial: [phone]

## 2023-04-08 NOTE — Progress Notes (Signed)
  Care Coordination   Note   04/08/2023 Name: Steven Horton MRN: [id number] DOB: 09/16/1930  Steven Horton is a 89 y.o. year old male who sees [facility] for [facility]. I reached out to [name] by phone today to offer care coordination services.  Steven Horton was given information about Care Coordination services today including:   The Care Coordination services include support from the care team which includes your Nurse Coordinator, Clinical Social Worker, or Pharmacist.  The Care Coordination team is here to help remove barriers to the health concerns and goals most important to you. Care Coordination services are voluntary, and the patient may decline or stop services at any time by request to their care team member.   Care Coordination Consent Status: Patient agreed to services and verbal consent obtained.   Follow up plan:  Telephone appointment with care coordination team member scheduled for:  04/16/2023  Encounter Outcome:  Patient Scheduled  Burman Nieves, [facility] Care Coordination Care Guide Direct Dial: [phone]

## 2023-04-09 DIAGNOSIS — S72141D Displaced intertrochanteric fracture of right femur, subsequent encounter for closed fracture with routine healing: Secondary | ICD-10-CM | POA: Diagnosis not present

## 2023-04-09 DIAGNOSIS — F32A Depression, unspecified: Secondary | ICD-10-CM | POA: Diagnosis not present

## 2023-04-09 DIAGNOSIS — D631 Anemia in chronic kidney disease: Secondary | ICD-10-CM | POA: Diagnosis not present

## 2023-04-09 DIAGNOSIS — N1832 Chronic kidney disease, stage 3b: Secondary | ICD-10-CM | POA: Diagnosis not present

## 2023-04-09 DIAGNOSIS — I700 Atherosclerosis of aorta: Secondary | ICD-10-CM | POA: Diagnosis not present

## 2023-04-09 DIAGNOSIS — I129 Hypertensive chronic kidney disease with stage 1 through stage 4 chronic kidney disease, or unspecified chronic kidney disease: Secondary | ICD-10-CM | POA: Diagnosis not present

## 2023-04-09 DIAGNOSIS — M62838 Other muscle spasm: Secondary | ICD-10-CM | POA: Diagnosis not present

## 2023-04-12 DIAGNOSIS — N1832 Chronic kidney disease, stage 3b: Secondary | ICD-10-CM | POA: Diagnosis not present

## 2023-04-12 DIAGNOSIS — I700 Atherosclerosis of aorta: Secondary | ICD-10-CM | POA: Diagnosis not present

## 2023-04-12 DIAGNOSIS — F32A Depression, unspecified: Secondary | ICD-10-CM | POA: Diagnosis not present

## 2023-04-12 DIAGNOSIS — D631 Anemia in chronic kidney disease: Secondary | ICD-10-CM | POA: Diagnosis not present

## 2023-04-12 DIAGNOSIS — S72141D Displaced intertrochanteric fracture of right femur, subsequent encounter for closed fracture with routine healing: Secondary | ICD-10-CM | POA: Diagnosis not present

## 2023-04-12 DIAGNOSIS — I129 Hypertensive chronic kidney disease with stage 1 through stage 4 chronic kidney disease, or unspecified chronic kidney disease: Secondary | ICD-10-CM | POA: Diagnosis not present

## 2023-04-14 DIAGNOSIS — D631 Anemia in chronic kidney disease: Secondary | ICD-10-CM | POA: Diagnosis not present

## 2023-04-14 DIAGNOSIS — I700 Atherosclerosis of aorta: Secondary | ICD-10-CM | POA: Diagnosis not present

## 2023-04-14 DIAGNOSIS — S72141D Displaced intertrochanteric fracture of right femur, subsequent encounter for closed fracture with routine healing: Secondary | ICD-10-CM | POA: Diagnosis not present

## 2023-04-14 DIAGNOSIS — N1832 Chronic kidney disease, stage 3b: Secondary | ICD-10-CM | POA: Diagnosis not present

## 2023-04-14 DIAGNOSIS — F32A Depression, unspecified: Secondary | ICD-10-CM | POA: Diagnosis not present

## 2023-04-14 DIAGNOSIS — I129 Hypertensive chronic kidney disease with stage 1 through stage 4 chronic kidney disease, or unspecified chronic kidney disease: Secondary | ICD-10-CM | POA: Diagnosis not present

## 2023-04-15 DIAGNOSIS — F32A Depression, unspecified: Secondary | ICD-10-CM | POA: Diagnosis not present

## 2023-04-15 DIAGNOSIS — D631 Anemia in chronic kidney disease: Secondary | ICD-10-CM | POA: Diagnosis not present

## 2023-04-15 DIAGNOSIS — S72141D Displaced intertrochanteric fracture of right femur, subsequent encounter for closed fracture with routine healing: Secondary | ICD-10-CM | POA: Diagnosis not present

## 2023-04-15 DIAGNOSIS — I129 Hypertensive chronic kidney disease with stage 1 through stage 4 chronic kidney disease, or unspecified chronic kidney disease: Secondary | ICD-10-CM | POA: Diagnosis not present

## 2023-04-15 DIAGNOSIS — I700 Atherosclerosis of aorta: Secondary | ICD-10-CM | POA: Diagnosis not present

## 2023-04-15 DIAGNOSIS — N1832 Chronic kidney disease, stage 3b: Secondary | ICD-10-CM | POA: Diagnosis not present

## 2023-04-16 ENCOUNTER — Other Ambulatory Visit: Payer: Self-pay | Admitting: *Deleted

## 2023-04-16 NOTE — Patient Outreach (Signed)
Care Management   Visit Note  04/16/2023 Name: Steven Horton MRN: [id number] DOB: 11-Mar-1931  Subjective: Steven Horton is a 89 y.o. year old male who is a primary care patient of [facility]. The Care Management team was consulted for assistance.      Engaged with patient spoke with patient by telephone.    Goals Addressed             This Visit's Progress    RNCM Care Management Expected Outcomes: Monitor, Self-Manage and Reduce Symptoms of: HTN       Current Barriers:  Knowledge Deficits related to plan of care for management of HTN  Chronic Disease Management support and education needs related to HTN   RNCM Clinical Goal(s):  Patient will verbalize basic understanding of  HTN disease process and self health management plan as evidenced by verbal explanation, recognizing symptoms and lifestyle modifications take all medications exactly as prescribed and will call provider for medication related questions as evidenced by compliance with all medications attend all scheduled medical appointments: with primary care provider and specialists as evidenced by keeping all scheduled appointments demonstrate Improved and Ongoing adherence to prescribed treatment plan for HTN as evidenced by consistent medication compliance, symptom monitoring, continued lifestyle modifications continue to work with RN Care Manager to address care management and care coordination needs related to  HTN as evidenced by adherence to CM Team Scheduled appointments through collaboration with RN Care manager, provider, and care team.   Interventions: Evaluation of current treatment plan related to  self management and patient's adherence to plan as established by provider   Hypertension Interventions:  (Status:  New goal. and Goal on track:  Yes.) Long Term Goal Last practice recorded BP readings:  BP Readings from Last 3 Encounters:  02/01/23 (!) 142/58  08/02/20 (!) 142/7  01/18/17 (!)  122/48   Most recent eGFR/CrCl: No results found for: "EGFR"  No components found for: "CRCL"  Evaluation of current treatment plan related to hypertension self management and patient's adherence to plan as established by provider. States he does not check his blood pressure but he does have home health that has been checking it regularly during the visit and they tell him it is good. He denies any swelling in his legs/feet. Provided education to patient re: stroke prevention, s/s of heart attack and stroke Reviewed medications with patient and discussed importance of compliance Counseled on adverse effects of illicit drug and excessive alcohol use in patients with high blood pressure  Counseled on the importance of exercise goals with target of 150 minutes per week. States he does not have a structured exercise plan but he does follow the plan that was provided to him by home health PT. Discussed plans with patient for ongoing care management follow up and provided patient with direct contact information for care management team Advised patient, providing education and rationale, to monitor blood pressure daily and record, calling PCP for findings outside established parameters Reviewed scheduled/upcoming provider appointments including:  Advised patient to discuss any new changes with provider Discussed complications of poorly controlled blood pressure such as heart disease, stroke, circulatory complications, vision complications, kidney impairment, sexual dysfunction Screening for signs and symptoms of depression related to chronic disease state  Assessed social determinant of health barriers  Patient Goals/Self-Care Activities: Take all medications as prescribed Attend all scheduled provider appointments Call pharmacy for medication refills 3-7 days in advance of running out of medications Attend church or other social activities Perform IADL's (  shopping, preparing meals, housekeeping,  managing finances) independently Call provider office for new concerns or questions  check blood pressure daily write blood pressure results in a log or diary  Follow Up Plan:  Telephone follow up appointment with care management team member scheduled for:  05-17-2023 at 9:00 am          Consent to Services:  Patient was given information about care management services, agreed to services, and gave verbal consent to participate.   Plan: Telephone follow up appointment with care management team member scheduled for:05-17-2023 at 9:00 am  Danise Edge, BSN RN RN Care Manager  [facility]  Ambulatory Care Management  Direct Number: [phone]

## 2023-04-16 NOTE — Patient Instructions (Signed)
Care Management   Initial Visit Note  04/16/2023 Name: Steven Horton MRN: [id number] DOB: Oct 06, 1930  Steven Horton is enrolled in a Managed Medicaid plan: No. Outreach attempt today was successful.   Subjective:   Objective:  Assessment: [name] is a 89 y.o. year old male who [facility]. The care management team was consulted for assistance with care management and care coordination needs related to Disease Management.   Review of patient status, including review of consultants reports, relevant laboratory and other test results, and collaboration with appropriate care team members and the patient's provider was performed as part of comprehensive patient evaluation and provision of care management services.    SDOH (Social Determinants of Health) screening performed today. See Care Plan Entry related to challenges with: None   Goals Addressed             This Visit's Progress    RNCM Care Management Expected Outcomes: Monitor, Self-Manage and Reduce Symptoms of: HTN       Current Barriers:  Knowledge Deficits related to plan of care for management of HTN  Chronic Disease Management support and education needs related to HTN   RNCM Clinical Goal(s):  Patient will verbalize basic understanding of  HTN disease process and self health management plan as evidenced by verbal explanation, recognizing symptoms and lifestyle modifications take all medications exactly as prescribed and will call provider for medication related questions as evidenced by compliance with all medications attend all scheduled medical appointments: with primary care provider and specialists as evidenced by keeping all scheduled appointments demonstrate Improved and Ongoing adherence to prescribed treatment plan for HTN as evidenced by consistent medication compliance, symptom monitoring, continued lifestyle modifications continue to work with RN Care Manager to  address care management and care coordination needs related to  HTN as evidenced by adherence to CM Team Scheduled appointments through collaboration with RN Care manager, provider, and care team.   Interventions: Evaluation of current treatment plan related to  self management and patient's adherence to plan as established by provider   Hypertension Interventions:  (Status:  New goal. and Goal on track:  Yes.) Long Term Goal Last practice recorded BP readings:  BP Readings from Last 3 Encounters:  02/01/23 (!) 142/58  08/02/20 (!) 142/7  01/18/17 (!) 122/48   Most recent eGFR/CrCl: No results found for: "EGFR"  No components found for: "CRCL"  Evaluation of current treatment plan related to hypertension self management and patient's adherence to plan as established by provider. States he does not check his blood pressure but he does have home health that has been checking it regularly during the visit and they tell him it is good. He denies any swelling in his legs/feet. Provided education to patient re: stroke prevention, s/s of heart attack and stroke Reviewed medications with patient and discussed importance of compliance Counseled on adverse effects of illicit drug and excessive alcohol use in patients with high blood pressure  Counseled on the importance of exercise goals with target of 150 minutes per week. States he does not have a structured exercise plan but he does follow the plan that was provided to him by home health PT. Discussed plans with patient for ongoing care management follow up and provided patient with direct contact information for care management team Advised patient, providing education and rationale, to monitor blood pressure daily and record, calling PCP for findings outside established parameters Reviewed scheduled/upcoming provider appointments including:  Advised patient to discuss  any new changes with provider Discussed complications of poorly controlled blood  pressure such as heart disease, stroke, circulatory complications, vision complications, kidney impairment, sexual dysfunction Screening for signs and symptoms of depression related to chronic disease state  Assessed social determinant of health barriers  Patient Goals/Self-Care Activities: Take all medications as prescribed Attend all scheduled provider appointments Call pharmacy for medication refills 3-7 days in advance of running out of medications Attend church or other social activities Perform IADL's (shopping, preparing meals, housekeeping, managing finances) independently Call provider office for new concerns or questions  check blood pressure daily write blood pressure results in a log or diary  Follow Up Plan:  Telephone follow up appointment with care management team member scheduled for:  05-17-2023 at 9:00 am           Follow up plan:  Telephone follow up appointment with care management team member scheduled for:05-17-2023 at 9:00 am  [name] was given information about Care Management services today including:  Care Management services include personalized support from designated clinical staff supervised by a physician, including individualized plan of care and coordination with other care providers 24/7 contact phone numbers for assistance for urgent and routine care needs. The patient may stop CCM services at any time (effective at the end of the month) by phone call to the office staff.  Patient agreed to services and verbal consent obtained.  Danise Edge, BSN RN RN Care Manager  [place]  [facility]  Direct Number: [phone]

## 2023-04-19 DIAGNOSIS — I700 Atherosclerosis of aorta: Secondary | ICD-10-CM | POA: Diagnosis not present

## 2023-04-19 DIAGNOSIS — F32A Depression, unspecified: Secondary | ICD-10-CM | POA: Diagnosis not present

## 2023-04-19 DIAGNOSIS — I129 Hypertensive chronic kidney disease with stage 1 through stage 4 chronic kidney disease, or unspecified chronic kidney disease: Secondary | ICD-10-CM | POA: Diagnosis not present

## 2023-04-19 DIAGNOSIS — N1832 Chronic kidney disease, stage 3b: Secondary | ICD-10-CM | POA: Diagnosis not present

## 2023-04-19 DIAGNOSIS — D631 Anemia in chronic kidney disease: Secondary | ICD-10-CM | POA: Diagnosis not present

## 2023-04-19 DIAGNOSIS — S72141D Displaced intertrochanteric fracture of right femur, subsequent encounter for closed fracture with routine healing: Secondary | ICD-10-CM | POA: Diagnosis not present

## 2023-04-20 DIAGNOSIS — I129 Hypertensive chronic kidney disease with stage 1 through stage 4 chronic kidney disease, or unspecified chronic kidney disease: Secondary | ICD-10-CM | POA: Diagnosis not present

## 2023-04-20 DIAGNOSIS — D631 Anemia in chronic kidney disease: Secondary | ICD-10-CM | POA: Diagnosis not present

## 2023-04-20 DIAGNOSIS — F32A Depression, unspecified: Secondary | ICD-10-CM | POA: Diagnosis not present

## 2023-04-20 DIAGNOSIS — I700 Atherosclerosis of aorta: Secondary | ICD-10-CM | POA: Diagnosis not present

## 2023-04-20 DIAGNOSIS — N1832 Chronic kidney disease, stage 3b: Secondary | ICD-10-CM | POA: Diagnosis not present

## 2023-04-20 DIAGNOSIS — S72141D Displaced intertrochanteric fracture of right femur, subsequent encounter for closed fracture with routine healing: Secondary | ICD-10-CM | POA: Diagnosis not present

## 2023-04-26 DIAGNOSIS — N1832 Chronic kidney disease, stage 3b: Secondary | ICD-10-CM | POA: Diagnosis not present

## 2023-04-26 DIAGNOSIS — D631 Anemia in chronic kidney disease: Secondary | ICD-10-CM | POA: Diagnosis not present

## 2023-04-26 DIAGNOSIS — I700 Atherosclerosis of aorta: Secondary | ICD-10-CM | POA: Diagnosis not present

## 2023-04-26 DIAGNOSIS — F32A Depression, unspecified: Secondary | ICD-10-CM | POA: Diagnosis not present

## 2023-04-26 DIAGNOSIS — I129 Hypertensive chronic kidney disease with stage 1 through stage 4 chronic kidney disease, or unspecified chronic kidney disease: Secondary | ICD-10-CM | POA: Diagnosis not present

## 2023-04-26 DIAGNOSIS — S72141D Displaced intertrochanteric fracture of right femur, subsequent encounter for closed fracture with routine healing: Secondary | ICD-10-CM | POA: Diagnosis not present

## 2023-04-27 DIAGNOSIS — Z09 Encounter for follow-up examination after completed treatment for conditions other than malignant neoplasm: Secondary | ICD-10-CM | POA: Diagnosis not present

## 2023-04-27 DIAGNOSIS — N1832 Chronic kidney disease, stage 3b: Secondary | ICD-10-CM | POA: Diagnosis not present

## 2023-04-27 DIAGNOSIS — Z6831 Body mass index (BMI) 31.0-31.9, adult: Secondary | ICD-10-CM | POA: Diagnosis not present

## 2023-04-27 DIAGNOSIS — S72141D Displaced intertrochanteric fracture of right femur, subsequent encounter for closed fracture with routine healing: Secondary | ICD-10-CM | POA: Diagnosis not present

## 2023-04-27 DIAGNOSIS — R634 Abnormal weight loss: Secondary | ICD-10-CM | POA: Diagnosis not present

## 2023-04-29 ENCOUNTER — Other Ambulatory Visit: Payer: Self-pay | Admitting: Family Medicine

## 2023-04-29 DIAGNOSIS — S72146A Nondisplaced intertrochanteric fracture of unspecified femur, initial encounter for closed fracture: Secondary | ICD-10-CM

## 2023-04-30 DIAGNOSIS — D631 Anemia in chronic kidney disease: Secondary | ICD-10-CM | POA: Diagnosis not present

## 2023-04-30 DIAGNOSIS — F32A Depression, unspecified: Secondary | ICD-10-CM | POA: Diagnosis not present

## 2023-04-30 DIAGNOSIS — S72141D Displaced intertrochanteric fracture of right femur, subsequent encounter for closed fracture with routine healing: Secondary | ICD-10-CM | POA: Diagnosis not present

## 2023-04-30 DIAGNOSIS — I129 Hypertensive chronic kidney disease with stage 1 through stage 4 chronic kidney disease, or unspecified chronic kidney disease: Secondary | ICD-10-CM | POA: Diagnosis not present

## 2023-04-30 DIAGNOSIS — N1832 Chronic kidney disease, stage 3b: Secondary | ICD-10-CM | POA: Diagnosis not present

## 2023-04-30 DIAGNOSIS — I700 Atherosclerosis of aorta: Secondary | ICD-10-CM | POA: Diagnosis not present

## 2023-05-01 DIAGNOSIS — Z7901 Long term (current) use of anticoagulants: Secondary | ICD-10-CM | POA: Diagnosis not present

## 2023-05-01 DIAGNOSIS — N1832 Chronic kidney disease, stage 3b: Secondary | ICD-10-CM | POA: Diagnosis not present

## 2023-05-01 DIAGNOSIS — Z86718 Personal history of other venous thrombosis and embolism: Secondary | ICD-10-CM | POA: Diagnosis not present

## 2023-05-01 DIAGNOSIS — I129 Hypertensive chronic kidney disease with stage 1 through stage 4 chronic kidney disease, or unspecified chronic kidney disease: Secondary | ICD-10-CM | POA: Diagnosis not present

## 2023-05-01 DIAGNOSIS — R3915 Urgency of urination: Secondary | ICD-10-CM | POA: Diagnosis not present

## 2023-05-01 DIAGNOSIS — S72144D Nondisplaced intertrochanteric fracture of right femur, subsequent encounter for closed fracture with routine healing: Secondary | ICD-10-CM | POA: Diagnosis not present

## 2023-05-01 DIAGNOSIS — D631 Anemia in chronic kidney disease: Secondary | ICD-10-CM | POA: Diagnosis not present

## 2023-05-01 DIAGNOSIS — S72141D Displaced intertrochanteric fracture of right femur, subsequent encounter for closed fracture with routine healing: Secondary | ICD-10-CM | POA: Diagnosis not present

## 2023-05-01 DIAGNOSIS — N401 Enlarged prostate with lower urinary tract symptoms: Secondary | ICD-10-CM | POA: Diagnosis not present

## 2023-05-01 DIAGNOSIS — I700 Atherosclerosis of aorta: Secondary | ICD-10-CM | POA: Diagnosis not present

## 2023-05-01 DIAGNOSIS — E039 Hypothyroidism, unspecified: Secondary | ICD-10-CM | POA: Diagnosis not present

## 2023-05-01 DIAGNOSIS — Z87891 Personal history of nicotine dependence: Secondary | ICD-10-CM | POA: Diagnosis not present

## 2023-05-01 DIAGNOSIS — F32A Depression, unspecified: Secondary | ICD-10-CM | POA: Diagnosis not present

## 2023-05-03 DIAGNOSIS — D631 Anemia in chronic kidney disease: Secondary | ICD-10-CM | POA: Diagnosis not present

## 2023-05-03 DIAGNOSIS — F32A Depression, unspecified: Secondary | ICD-10-CM | POA: Diagnosis not present

## 2023-05-03 DIAGNOSIS — I700 Atherosclerosis of aorta: Secondary | ICD-10-CM | POA: Diagnosis not present

## 2023-05-03 DIAGNOSIS — S72141D Displaced intertrochanteric fracture of right femur, subsequent encounter for closed fracture with routine healing: Secondary | ICD-10-CM | POA: Diagnosis not present

## 2023-05-03 DIAGNOSIS — N1832 Chronic kidney disease, stage 3b: Secondary | ICD-10-CM | POA: Diagnosis not present

## 2023-05-03 DIAGNOSIS — I129 Hypertensive chronic kidney disease with stage 1 through stage 4 chronic kidney disease, or unspecified chronic kidney disease: Secondary | ICD-10-CM | POA: Diagnosis not present

## 2023-05-12 DIAGNOSIS — S72141D Displaced intertrochanteric fracture of right femur, subsequent encounter for closed fracture with routine healing: Secondary | ICD-10-CM | POA: Diagnosis not present

## 2023-05-12 DIAGNOSIS — N1832 Chronic kidney disease, stage 3b: Secondary | ICD-10-CM | POA: Diagnosis not present

## 2023-05-12 DIAGNOSIS — I129 Hypertensive chronic kidney disease with stage 1 through stage 4 chronic kidney disease, or unspecified chronic kidney disease: Secondary | ICD-10-CM | POA: Diagnosis not present

## 2023-05-12 DIAGNOSIS — I700 Atherosclerosis of aorta: Secondary | ICD-10-CM | POA: Diagnosis not present

## 2023-05-12 DIAGNOSIS — D631 Anemia in chronic kidney disease: Secondary | ICD-10-CM | POA: Diagnosis not present

## 2023-05-12 DIAGNOSIS — F32A Depression, unspecified: Secondary | ICD-10-CM | POA: Diagnosis not present

## 2023-05-17 ENCOUNTER — Telehealth: Payer: Self-pay | Admitting: *Deleted

## 2023-05-17 ENCOUNTER — Other Ambulatory Visit: Payer: Self-pay | Admitting: *Deleted

## 2023-05-17 ENCOUNTER — Encounter: Payer: Self-pay | Admitting: *Deleted

## 2023-05-17 NOTE — Patient Outreach (Addendum)
Care Management   Visit Note  05/17/2023 Name: Steven Horton MRN: [id number] DOB: [birth year]  Subjective: Steven Horton is a 89 y.o. year old male who is a primary care patient of [facility]. The Care Management team was consulted for assistance.      Engaged with patient spoke with patient by telephone.   Goals Addressed             This Visit's Progress    RNCM Care Management Expected Outcomes: Monitor, Self-Manage and Reduce Symptoms of: HTN       Current Barriers:  Knowledge Deficits related to plan of care for management of HTN  Chronic Disease Management support and education needs related to HTN   RNCM Clinical Goal(s):  Patient will verbalize basic understanding of  HTN disease process and self health management plan as evidenced by verbal explanation, recognizing symptoms and lifestyle modifications take all medications exactly as prescribed and will call provider for medication related questions as evidenced by compliance with all medications attend all scheduled medical appointments: with primary care provider and specialists as evidenced by keeping all scheduled appointments demonstrate Improved and Ongoing adherence to prescribed treatment plan for HTN as evidenced by consistent medication compliance, symptom monitoring, continued lifestyle modifications continue to work with RN Care Manager to address care management and care coordination needs related to  HTN as evidenced by adherence to CM Team Scheduled appointments through collaboration with RN Care manager, provider, and care team.   Interventions: Evaluation of current treatment plan related to  self management and patient's adherence to plan as established by provider   Hypertension Interventions:  (Status:   Unable to confirm ) Long Term Goal Last practice recorded BP readings:  BP Readings from Last 3 Encounters:  02/01/23 (!) 142/58  08/02/20 (!) 142/7  01/18/17 (!) 122/48   Most  recent eGFR/CrCl: No results found for: "EGFR"  No components found for: "CRCL"  Evaluation of current treatment plan related to hypertension self management and patient's adherence to plan as established by provider. Patient states that his personal care service, [employer] checks his blood pressure regularly but does not write down or provide him with the results. RNCM suggested that he ask for the blood pressure to be written down so that we can ensure that his blood pressure is within an acceptable range. RNCM discussed with patient that SBP<140 and DBP<90. He denies any swelling, chest pain or dizziness. Provided education to patient re: stroke prevention, s/s of heart attack and stroke Reviewed medications with patient and discussed importance of compliance. Reports compliance with all medications Counseled on adverse effects of illicit drug and excessive alcohol use in patients with high blood pressure. Denies any illicit drug or alcohol use. Counseled on the importance of exercise goals with target of 150 minutes per week. Patient states that he has been continuing with the exercises provided by home health and he is walking. Discussed plans with patient for ongoing care management follow up and provided patient with direct contact information for care management team Advised patient, providing education and rationale, to monitor blood pressure daily and record, calling PCP for findings outside established parameters Reviewed scheduled/upcoming provider appointments including: 06-23-2023 with PCP Advised patient to discuss any new changes with provider Discussed complications of poorly controlled blood pressure such as heart disease, stroke, circulatory complications, vision complications, kidney impairment, sexual dysfunction Screening for signs and symptoms of depression related to chronic disease state  Assessed social determinant of health barriers  Patient Goals/Self-Care  Activities: Take all  medications as prescribed Attend all scheduled provider appointments Call pharmacy for medication refills 3-7 days in advance of running out of medications Attend church or other social activities Perform IADL's (shopping, preparing meals, housekeeping, managing finances) independently Call provider office for new concerns or questions  check blood pressure daily write blood pressure results in a log or diary  Follow Up Plan:  Telephone follow up appointment with care management team member scheduled for:  06-17-2023 at 9:00 am                Consent to Services:  Patient was given information about care management services, agreed to services, and gave verbal consent to participate.   Plan: Telephone follow up appointment with care management team member scheduled for:06-17-2023 at 9:00 am  Rosalene Billings, BSN RN [facility], [facility] RN Care Manager Direct Dial: [phone]  Fax: [phone]

## 2023-05-17 NOTE — Patient Outreach (Signed)
  Care Management   Follow Up Note   05/17/2023 Name: Steven Horton MRN: [id number] DOB: Dec 07, 1930   Referred by: [facility] Reason for referral : Care Management (RNCM: ATTEMPT #1 Follow Up For Chronic Disease Management & Care Coordination Needs)   An unsuccessful telephone outreach was attempted today. The patient was referred to the case management team for assistance with care management and care coordination.   Follow Up Plan: The care management team will reach out to the patient again over the next 30 days.   Rosalene Billings, BSN RN [facility], [facility] RN Care Manager Direct Dial: [phone]  Fax: [phone]

## 2023-05-17 NOTE — Patient Instructions (Signed)
Visit Information  Thank you for taking time to visit with me today. Please don't hesitate to contact me if I can be of assistance to you before our next scheduled telephone appointment.  Following are the goals we discussed today:   Goals Addressed             This Visit's Progress    RNCM Care Management Expected Outcomes: Monitor, Self-Manage and Reduce Symptoms of: HTN       Current Barriers:  Knowledge Deficits related to plan of care for management of HTN  Chronic Disease Management support and education needs related to HTN   RNCM Clinical Goal(s):  Patient will verbalize basic understanding of  HTN disease process and self health management plan as evidenced by verbal explanation, recognizing symptoms and lifestyle modifications take all medications exactly as prescribed and will call provider for medication related questions as evidenced by compliance with all medications attend all scheduled medical appointments: with primary care provider and specialists as evidenced by keeping all scheduled appointments demonstrate Improved and Ongoing adherence to prescribed treatment plan for HTN as evidenced by consistent medication compliance, symptom monitoring, continued lifestyle modifications continue to work with RN Care Manager to address care management and care coordination needs related to  HTN as evidenced by adherence to CM Team Scheduled appointments through collaboration with RN Care manager, provider, and care team.   Interventions: Evaluation of current treatment plan related to  self management and patient's adherence to plan as established by provider   Hypertension Interventions:  (Status:   Unable to confirm ) Long Term Goal Last practice recorded BP readings:  BP Readings from Last 3 Encounters:  02/01/23 (!) 142/58  08/02/20 (!) 142/7  01/18/17 (!) 122/48   Most recent eGFR/CrCl: No results found for: "EGFR"  No components found for: "CRCL"  Evaluation of  current treatment plan related to hypertension self management and patient's adherence to plan as established by provider. Patient states that his personal care service, [employer] checks his blood pressure regularly but does not write down or provide him with the results. RNCM suggested that he ask for the blood pressure to be written down so that we can ensure that his blood pressure is within an acceptable range. RNCM discussed with patient that SBP<140 and DBP<90. He denies any swelling, chest pain or dizziness. Provided education to patient re: stroke prevention, s/s of heart attack and stroke Reviewed medications with patient and discussed importance of compliance. Reports compliance with all medications Counseled on adverse effects of illicit drug and excessive alcohol use in patients with high blood pressure. Denies any illicit drug or alcohol use. Counseled on the importance of exercise goals with target of 150 minutes per week. Patient states that he has been continuing with the exercises provided by home health and he is walking. Discussed plans with patient for ongoing care management follow up and provided patient with direct contact information for care management team Advised patient, providing education and rationale, to monitor blood pressure daily and record, calling PCP for findings outside established parameters Reviewed scheduled/upcoming provider appointments including: 06-23-2023 with PCP Advised patient to discuss any new changes with provider Discussed complications of poorly controlled blood pressure such as heart disease, stroke, circulatory complications, vision complications, kidney impairment, sexual dysfunction Screening for signs and symptoms of depression related to chronic disease state  Assessed social determinant of health barriers  Patient Goals/Self-Care Activities: Take all medications as prescribed Attend all scheduled provider appointments Call pharmacy for  medication  refills 3-7 days in advance of running out of medications Attend church or other social activities Perform IADL's (shopping, preparing meals, housekeeping, managing finances) independently Call provider office for new concerns or questions  check blood pressure daily write blood pressure results in a log or diary  Follow Up Plan:  Telephone follow up appointment with care management team member scheduled for:  06-17-2023 at 9:00 am           Our next appointment is by telephone on 06-17-2023 at 9:00 am  Please call the care guide team at [phone] if you need to cancel or reschedule your appointment.   If you are experiencing a Mental Health or Behavioral Health Crisis or need someone to talk to, please call the Suicide and Crisis Lifeline: 988 call the [place] National Suicide Prevention Lifeline: 269 594 9116 or TTY: 450-787-6482 TTY 402-448-0846) to talk to a trained counselor call 1-800-273-TALK (toll free, 24 hour hotline) go to [facility] [address], [place] [phone])   The patient verbalized understanding of instructions, educational materials, and care plan provided today and DECLINED offer to receive copy of patient instructions, educational materials, and care plan.   Telephone follow up appointment with care management team member scheduled for:06-17-2023 at 9:00 am  Rosalene Billings, BSN RN [facility], [facility] RN Care Manager Direct Dial: [phone]  Fax: [phone]

## 2023-06-06 ENCOUNTER — Other Ambulatory Visit: Payer: Self-pay

## 2023-06-06 ENCOUNTER — Emergency Department (HOSPITAL_COMMUNITY)
Admission: EM | Admit: 2023-06-06 | Discharge: 2023-06-07 | Disposition: A | Discharge status: Home / Self Care | Payer: Self-pay | Attending: Emergency Medicine | Admitting: Emergency Medicine

## 2023-06-06 ENCOUNTER — Encounter (HOSPITAL_COMMUNITY): Payer: Self-pay | Admitting: *Deleted

## 2023-06-06 DIAGNOSIS — Z0189 Encounter for other specified special examinations: Secondary | ICD-10-CM | POA: Diagnosis not present

## 2023-06-06 NOTE — ED Provider Notes (Signed)
  [facility]ENC[facility]PT [facility] Emergency Department Provider Note MRN:  [id number]  Arrival date & time: 06/07/23     Chief Complaint   Needs blood draw  History of Present Illness   Steven Horton is a 89 y.o. year-old male presents to the ED with chief complaint of needing blood draw.  Patient is the source patient of a blood exposure with EMS personnel.  He was BIB EMS to get blood drawn for exposure panel.  Patient denies any other complaints.  History provided by patient.   Review of Systems  Pertinent positive and negative review of systems noted in HPI.    Physical Exam   Vitals:   06/06/23 2325  BP: (!) 173/61  Pulse: 63  Resp: (!) 22  Temp: 97.6 F (36.4 C)  SpO2: 99%    CONSTITUTIONAL:  non toxic-appearing, NAD NEURO:  Alert and oriented x 3, CN 3-12 grossly intact EYES:  eyes equal and reactive ENT/NECK:  Supple, no stridor  CARDIO:  normal rate, appears well-perfused  PULM:  No respiratory distress,  GI/GU:  non-distended,  MSK/SPINE:  No gross deformities, no edema, moves all extremities  SKIN:  no rash, atraumatic   *Additional and/or pertinent findings included in MDM below  Diagnostic and Interventional Summary    EKG Interpretation Date/Time:    Ventricular Rate:    PR Interval:    QRS Duration:    QT Interval:    QTC Calculation:   R Axis:      Text Interpretation:         Labs Reviewed  RAPID HIV SCREEN (HIV 1/2 AB+AG)  HEPATITIS PANEL, ACUTE    No orders to display    Medications - No data to display   Procedures  /  Critical Care Procedures  ED Course and Medical Decision Making  I have reviewed the triage vital signs, the nursing notes, and pertinent available records from the EMR.  Social Determinants Affecting Complexity of Care: Patient has no clinically significant social determinants affecting this chief complaint..   ED Course:    Medical Decision Making Amount and/or Complexity of Data Reviewed Labs:  ordered.         Consultants: No consultations were needed in caring for this patient.   Treatment and Plan: Emergency department workup does not suggest an emergent condition requiring admission or immediate intervention beyond  what has been performed at this time. The patient is safe for discharge and has  been instructed to return immediately for worsening symptoms, change in  symptoms or any other concerns    Final Clinical Impressions(s) / ED Diagnoses     ICD-10-CM   1. Encounter for laboratory test  Z01.89       ED Discharge Orders     None         Discharge Instructions Discussed with and Provided to Patient:   Discharge Instructions   None      Vicky Charleston, PA-C 06/07/23 0415    Nicholaus Cassondra DEL, MD 06/07/23 1452

## 2023-06-06 NOTE — ED Triage Notes (Signed)
 The pt had a nosebleed and gems was in the home when the pts nose was bleeding some of his blood popped into the pts lt eye  he was the source so the pt was brought here to be tested for  hiv  no nosebleed noe

## 2023-06-07 LAB — HEPATITIS PANEL, ACUTE
HCV Ab: NONREACTIVE
Hep A IgM: NONREACTIVE
Hep B C IgM: NONREACTIVE
Hepatitis B Surface Ag: NONREACTIVE

## 2023-06-07 LAB — RAPID HIV SCREEN (HIV 1/2 AB+AG)
HIV 1/2 Antibodies: NONREACTIVE
HIV-1 P24 Antigen - HIV24: NONREACTIVE
Interpretation (HIV Ag Ab): NONREACTIVE

## 2023-06-14 DIAGNOSIS — R040 Epistaxis: Secondary | ICD-10-CM | POA: Diagnosis not present

## 2023-06-14 DIAGNOSIS — R634 Abnormal weight loss: Secondary | ICD-10-CM | POA: Diagnosis not present

## 2023-06-14 DIAGNOSIS — I10 Essential (primary) hypertension: Secondary | ICD-10-CM | POA: Diagnosis not present

## 2023-06-17 ENCOUNTER — Other Ambulatory Visit: Payer: Self-pay | Admitting: *Deleted

## 2023-06-17 ENCOUNTER — Telehealth: Payer: Self-pay | Admitting: *Deleted

## 2023-06-17 NOTE — Patient Outreach (Signed)
  Care Management   Follow Up Note   06/17/2023 Name: Steven Horton MRN: [id number] DOB: 1931-04-13   Referred by: [facility] Reason for referral : Care Management (RNCM: ATTEMPT Follow Up For Chronic Disease Management & Care Coordination Needs)   An unsuccessful telephone outreach was attempted today. The patient was referred to the case management team for assistance with care management and care coordination.   Follow Up Plan: The care management team will reach out to the patient again over the next 30 days.   Larey Brick, BSN RN [facility], [facility] RN Care Manager Direct Dial: [phone]  Fax: [phone]

## 2023-06-17 NOTE — Patient Instructions (Signed)
Visit Information  Thank you for taking time to visit with me today. Please don't hesitate to contact me if I can be of assistance to you before our next scheduled telephone appointment.  Following are the goals we discussed today:   Goals Addressed             This Visit's Progress    RNCM Care Management Expected Outcomes: Monitor, Self-Manage and Reduce Symptoms of: HTN       Current Barriers:  Knowledge Deficits related to plan of care for management of HTN  Chronic Disease Management support and education needs related to HTN   RNCM Clinical Goal(s):  Patient will verbalize basic understanding of  HTN disease process and self health management plan as evidenced by verbal explanation, recognizing symptoms and lifestyle modifications take all medications exactly as prescribed and will call provider for medication related questions as evidenced by compliance with all medications attend all scheduled medical appointments: with primary care provider and specialists as evidenced by keeping all scheduled appointments demonstrate Improved and Ongoing adherence to prescribed treatment plan for HTN as evidenced by consistent medication compliance, symptom monitoring, continued lifestyle modifications continue to work with RN Care Manager to address care management and care coordination needs related to  HTN as evidenced by adherence to CM Team Scheduled appointments through collaboration with RN Care manager, provider, and care team.   Interventions: Evaluation of current treatment plan related to  self management and patient's adherence to plan as established by provider   Hypertension Interventions:  (Status:   Unable to confirm ) Long Term Goal Last practice recorded BP readings:  BP Readings from Last 3 Encounters:  06/06/23 (!) 173/61  02/01/23 (!) 142/58  08/02/20 (!) 142/7   Most recent eGFR/CrCl: No results found for: "EGFR"  No components found for: "CRCL"  Evaluation of  current treatment plan related to hypertension self management and patient's adherence to plan as established by provider. Patient states he does not have a way to check his BP at home. He denies any swelling, chest pain or dizziness. Provided education to patient re: stroke prevention, s/s of heart attack and stroke. Education and support provided. Reviewed medications with patient and discussed importance of compliance. Reports compliance with all medications Counseled on adverse effects of illicit drug and excessive alcohol use in patients with high blood pressure. Denies any illicit drug or alcohol use. Counseled on the importance of exercise goals with target of 150 minutes per week. Patient states that he has been continuing with the exercises provided by home health and he is walking. Discussed plans with patient for ongoing care management follow up and provided patient with direct contact information for care management team Advised patient, providing education and rationale, to monitor blood pressure daily and record, calling PCP for findings outside established parameters. RNCM discussed with patient that SBP<140 and DBP<90.  Reviewed scheduled/upcoming provider appointments including:  Advised patient to discuss any new changes with provider Discussed complications of poorly controlled blood pressure such as heart disease, stroke, circulatory complications, vision complications, kidney impairment, sexual dysfunction Screening for signs and symptoms of depression related to chronic disease state  Assessed social determinant of health barriers  Patient Goals/Self-Care Activities: Take all medications as prescribed Attend all scheduled provider appointments Call pharmacy for medication refills 3-7 days in advance of running out of medications Attend church or other social activities Perform IADL's (shopping, preparing meals, housekeeping, managing finances) independently Call provider  office for new concerns or questions  check blood pressure daily write blood pressure results in a log or diary  Follow Up Plan:  Telephone follow up appointment with care management team member scheduled for:  07-16-2023 at 9:45 am           Our next appointment is by telephone on 07-16-2023 at 9:45 am  Please call the care guide team at [phone] if you need to cancel or reschedule your appointment.   If you are experiencing a Mental Health or Behavioral Health Crisis or need someone to talk to, please call the Suicide and Crisis Lifeline: 988 call the [place] National Suicide Prevention Lifeline: 732-516-1701 or TTY: 479-152-3914 TTY (909)223-1729) to talk to a trained counselor call 1-800-273-TALK (toll free, 24 hour hotline) go to [facility] [address], [place] [phone])   The patient verbalized understanding of instructions, educational materials, and care plan provided today and DECLINED offer to receive copy of patient instructions, educational materials, and care plan.   Telephone follow up appointment with care management team member scheduled for:  Larey Brick, BSN RN [facility], [facility] RN Care Manager Direct Dial: [phone]  Fax: [phone]

## 2023-06-17 NOTE — Patient Outreach (Signed)
Care Management   Visit Note  06/17/2023 Name: Steven Horton MRN: [id number] DOB: [birth year]  Subjective: Steven Horton is a 89 y.o. year old male who is a primary care patient of [facility]. The Care Management team was consulted for assistance.      Engaged with patient spoke with patient by telephone.    Goals Addressed             This Visit's Progress    RNCM Care Management Expected Outcomes: Monitor, Self-Manage and Reduce Symptoms of: HTN       Current Barriers:  Knowledge Deficits related to plan of care for management of HTN  Chronic Disease Management support and education needs related to HTN   RNCM Clinical Goal(s):  Patient will verbalize basic understanding of  HTN disease process and self health management plan as evidenced by verbal explanation, recognizing symptoms and lifestyle modifications take all medications exactly as prescribed and will call provider for medication related questions as evidenced by compliance with all medications attend all scheduled medical appointments: with primary care provider and specialists as evidenced by keeping all scheduled appointments demonstrate Improved and Ongoing adherence to prescribed treatment plan for HTN as evidenced by consistent medication compliance, symptom monitoring, continued lifestyle modifications continue to work with RN Care Manager to address care management and care coordination needs related to  HTN as evidenced by adherence to CM Team Scheduled appointments through collaboration with RN Care manager, provider, and care team.   Interventions: Evaluation of current treatment plan related to  self management and patient's adherence to plan as established by provider   Hypertension Interventions:  (Status:   Unable to confirm ) Long Term Goal Last practice recorded BP readings:  BP Readings from Last 3 Encounters:  06/06/23 (!) 173/61  02/01/23 (!) 142/58  08/02/20 (!) 142/7   Most  recent eGFR/CrCl: No results found for: "EGFR"  No components found for: "CRCL"  Evaluation of current treatment plan related to hypertension self management and patient's adherence to plan as established by provider. Patient states he does not have a way to check his BP at home. He denies any swelling, chest pain or dizziness. Provided education to patient re: stroke prevention, s/s of heart attack and stroke. Education and support provided. Reviewed medications with patient and discussed importance of compliance. Reports compliance with all medications Counseled on adverse effects of illicit drug and excessive alcohol use in patients with high blood pressure. Denies any illicit drug or alcohol use. Counseled on the importance of exercise goals with target of 150 minutes per week. Patient states that he has been continuing with the exercises provided by home health and he is walking. Discussed plans with patient for ongoing care management follow up and provided patient with direct contact information for care management team Advised patient, providing education and rationale, to monitor blood pressure daily and record, calling PCP for findings outside established parameters. RNCM discussed with patient that SBP<140 and DBP<90.  Reviewed scheduled/upcoming provider appointments including:  Advised patient to discuss any new changes with provider Discussed complications of poorly controlled blood pressure such as heart disease, stroke, circulatory complications, vision complications, kidney impairment, sexual dysfunction Screening for signs and symptoms of depression related to chronic disease state  Assessed social determinant of health barriers  Patient Goals/Self-Care Activities: Take all medications as prescribed Attend all scheduled provider appointments Call pharmacy for medication refills 3-7 days in advance of running out of medications Attend church or other social activities Perform  IADL's (shopping, preparing meals, housekeeping, managing finances) independently Call provider office for new concerns or questions  check blood pressure daily write blood pressure results in a log or diary  Follow Up Plan:  Telephone follow up appointment with care management team member scheduled for:  07-16-2023 at 9:45 am           Consent to Services:  Patient was given information about care management services, agreed to services, and gave verbal consent to participate.   Plan: Telephone follow up appointment with care management team member scheduled for:07-16-2023 at 9:45 am  Larey Brick, BSN RN [facility], [facility] RN Care Manager Direct Dial: [phone]  Fax: [phone]

## 2023-06-23 DIAGNOSIS — E039 Hypothyroidism, unspecified: Secondary | ICD-10-CM | POA: Diagnosis not present

## 2023-06-23 DIAGNOSIS — I10 Essential (primary) hypertension: Secondary | ICD-10-CM | POA: Diagnosis not present

## 2023-06-23 DIAGNOSIS — E7800 Pure hypercholesterolemia, unspecified: Secondary | ICD-10-CM | POA: Diagnosis not present

## 2023-06-23 DIAGNOSIS — N1832 Chronic kidney disease, stage 3b: Secondary | ICD-10-CM | POA: Diagnosis not present

## 2023-06-23 DIAGNOSIS — M549 Dorsalgia, unspecified: Secondary | ICD-10-CM | POA: Diagnosis not present

## 2023-06-23 DIAGNOSIS — N400 Enlarged prostate without lower urinary tract symptoms: Secondary | ICD-10-CM | POA: Diagnosis not present

## 2023-06-23 DIAGNOSIS — Z0000 Encounter for general adult medical examination without abnormal findings: Secondary | ICD-10-CM | POA: Diagnosis not present

## 2023-07-07 DIAGNOSIS — I129 Hypertensive chronic kidney disease with stage 1 through stage 4 chronic kidney disease, or unspecified chronic kidney disease: Secondary | ICD-10-CM | POA: Diagnosis not present

## 2023-07-07 DIAGNOSIS — N1832 Chronic kidney disease, stage 3b: Secondary | ICD-10-CM | POA: Diagnosis not present

## 2023-07-07 DIAGNOSIS — N189 Chronic kidney disease, unspecified: Secondary | ICD-10-CM | POA: Diagnosis not present

## 2023-07-07 DIAGNOSIS — N2581 Secondary hyperparathyroidism of renal origin: Secondary | ICD-10-CM | POA: Diagnosis not present

## 2023-07-12 DIAGNOSIS — M7031 Other bursitis of elbow, right elbow: Secondary | ICD-10-CM | POA: Diagnosis not present

## 2023-07-14 DIAGNOSIS — L814 Other melanin hyperpigmentation: Secondary | ICD-10-CM | POA: Diagnosis not present

## 2023-07-14 DIAGNOSIS — Z08 Encounter for follow-up examination after completed treatment for malignant neoplasm: Secondary | ICD-10-CM | POA: Diagnosis not present

## 2023-07-14 DIAGNOSIS — Z85828 Personal history of other malignant neoplasm of skin: Secondary | ICD-10-CM | POA: Diagnosis not present

## 2023-07-14 DIAGNOSIS — D225 Melanocytic nevi of trunk: Secondary | ICD-10-CM | POA: Diagnosis not present

## 2023-07-14 DIAGNOSIS — D492 Neoplasm of unspecified behavior of bone, soft tissue, and skin: Secondary | ICD-10-CM | POA: Diagnosis not present

## 2023-07-14 DIAGNOSIS — L570 Actinic keratosis: Secondary | ICD-10-CM | POA: Diagnosis not present

## 2023-07-14 DIAGNOSIS — L821 Other seborrheic keratosis: Secondary | ICD-10-CM | POA: Diagnosis not present

## 2023-07-14 DIAGNOSIS — I872 Venous insufficiency (chronic) (peripheral): Secondary | ICD-10-CM | POA: Diagnosis not present

## 2023-07-14 DIAGNOSIS — D044 Carcinoma in situ of skin of scalp and neck: Secondary | ICD-10-CM | POA: Diagnosis not present

## 2023-07-16 ENCOUNTER — Encounter: Payer: Self-pay | Admitting: *Deleted

## 2023-07-16 ENCOUNTER — Other Ambulatory Visit: Payer: Self-pay | Admitting: *Deleted

## 2023-07-16 ENCOUNTER — Telehealth: Payer: Self-pay | Admitting: *Deleted

## 2023-07-16 NOTE — Patient Outreach (Signed)
  Care Management   Follow Up Note   07/16/2023 Name: Steven Horton MRN: [id number] DOB: [birth year]   Referred by: [facility] Reason for referral : Care Management (RNCM: ATTEMPTED Follow Up For Chronic Disease Management & Care Coordination Needs/)   An unsuccessful telephone outreach was attempted today. The patient was referred to the case management team for assistance with care management and care coordination.   Follow Up Plan: The care management team will reach out to the patient again over the next 30 days.   Larey Brick, BSN RN [facility], [facility] RN Care Manager Direct Dial: [phone]  Fax: [phone]

## 2023-07-16 NOTE — Patient Instructions (Signed)
 Visit Information  Thank you for taking time to visit with me today. Please don't hesitate to contact me if I can be of assistance to you before our next scheduled telephone appointment.  Following are the goals we discussed today:   Goals Addressed             This Visit's Progress    RNCM Care Management Expected Outcomes: Monitor, Self-Manage and Reduce Symptoms of: HTN       Current Barriers:  Knowledge Deficits related to plan of care for management of HTN  Chronic Disease Management support and education needs related to HTN   RNCM Clinical Goal(s):  Patient will verbalize basic understanding of  HTN disease process and self health management plan as evidenced by verbal explanation, recognizing symptoms and lifestyle modifications take all medications exactly as prescribed and will call provider for medication related questions as evidenced by compliance with all medications attend all scheduled medical appointments: with primary care provider and specialists as evidenced by keeping all scheduled appointments demonstrate Improved and Ongoing adherence to prescribed treatment plan for HTN as evidenced by consistent medication compliance, symptom monitoring, continued lifestyle modifications continue to work with RN Care Manager to address care management and care coordination needs related to  HTN as evidenced by adherence to CM Team Scheduled appointments through collaboration with RN Care manager, provider, and care team.   Interventions: Evaluation of current treatment plan related to  self management and patient's adherence to plan as established by provider   Hypertension Interventions:  (Status:  Goal on track:  Yes. and Unable to confirm ) Long Term Goal Last practice recorded BP readings:  BP Readings from Last 3 Encounters:  06/06/23 (!) 173/61  02/01/23 (!) 142/58  08/02/20 (!) 142/7   Most recent eGFR/CrCl: No results found for: "EGFR"  No components found for:  "CRCL"  Evaluation of current treatment plan related to hypertension self management and patient's adherence to plan as established by provider. Patient reports that his BP was slightly elevated in office. He denies any swelling, chest pain or dizziness. He continues to have personal care services weekly for 4 hours provided by [place]. Currently, he does not have a way to check his BP at home.  Provided education to patient re: stroke prevention, s/s of heart attack and stroke. Education and support provided. Reviewed medications with patient and discussed importance of compliance. Reports compliance with all medications and all med rec completed with patient today. Counseled on adverse effects of illicit drug and excessive alcohol use in patients with high blood pressure. Denies any illicit drug or alcohol use. Counseled on the importance of exercise goals with target of 150 minutes per week. Patient states that he has been continuing with the exercises provided by home health and he is walking using his walker Discussed plans with patient for ongoing care management follow up and provided patient with direct contact information for care management team Advised patient, providing education and rationale, to monitor blood pressure daily and record, calling PCP for findings outside established parameters. RNCM discussed with patient that SBP<140 and DBP<90.  Reviewed scheduled/upcoming provider appointments including:  Advised patient to discuss any new changes with provider Discussed complications of poorly controlled blood pressure such as heart disease, stroke, circulatory complications, vision complications, kidney impairment, sexual dysfunction Screening for signs and symptoms of depression related to chronic disease state  Assessed social determinant of health barriers  Patient Goals/Self-Care Activities: Take all medications as prescribed Attend all scheduled provider  appointments Call  pharmacy for medication refills 3-7 days in advance of running out of medications Attend church or other social activities Perform IADL's (shopping, preparing meals, housekeeping, managing finances) independently Call provider office for new concerns or questions  check blood pressure daily write blood pressure results in a log or diary  Follow Up Plan:  Telephone follow up appointment with care management team member scheduled for:  08-18-2023 at 9:00 am           Our next appointment is by telephone on 08-18-2023 at 9:00 am  Please call the care guide team at [phone] if you need to cancel or reschedule your appointment.   If you are experiencing a Mental Health or Behavioral Health Crisis or need someone to talk to, please call the Suicide and Crisis Lifeline: 988 call the [place] National Suicide Prevention Lifeline: 519-060-6329 or TTY: 607-409-7598 TTY (907) 708-1233) to talk to a trained counselor call 1-800-273-TALK (toll free, 24 hour hotline) go to [facility] [address], [place] [phone])   The patient verbalized understanding of instructions, educational materials, and care plan provided today and DECLINED offer to receive copy of patient instructions, educational materials, and care plan.   Telephone follow up appointment with care management team member scheduled for:08-18-2023 at 9:00 am  Larey Brick, BSN RN [facility], [facility] RN Care Manager Direct Dial: [phone]  Fax: [phone]

## 2023-07-16 NOTE — Patient Outreach (Signed)
 Care Management   Visit Note  07/16/2023 Name: Steven Horton MRN: [id number] DOB: [birth year]  Subjective: Steven Horton is a 89 y.o. year old male who is a primary care patient of [facility]. The Care Management team was consulted for assistance.      Engaged with patient spoke with patient by telephone.    Goals Addressed             This Visit's Progress    RNCM Care Management Expected Outcomes: Monitor, Self-Manage and Reduce Symptoms of: HTN       Current Barriers:  Knowledge Deficits related to plan of care for management of HTN  Chronic Disease Management support and education needs related to HTN   RNCM Clinical Goal(s):  Patient will verbalize basic understanding of  HTN disease process and self health management plan as evidenced by verbal explanation, recognizing symptoms and lifestyle modifications take all medications exactly as prescribed and will call provider for medication related questions as evidenced by compliance with all medications attend all scheduled medical appointments: with primary care provider and specialists as evidenced by keeping all scheduled appointments demonstrate Improved and Ongoing adherence to prescribed treatment plan for HTN as evidenced by consistent medication compliance, symptom monitoring, continued lifestyle modifications continue to work with RN Care Manager to address care management and care coordination needs related to  HTN as evidenced by adherence to CM Team Scheduled appointments through collaboration with RN Care manager, provider, and care team.   Interventions: Evaluation of current treatment plan related to  self management and patient's adherence to plan as established by provider   Hypertension Interventions:  (Status:  Goal on track:  Yes. and Unable to confirm ) Long Term Goal Last practice recorded BP readings:  BP Readings from Last 3 Encounters:  06/06/23 (!) 173/61  02/01/23 (!) 142/58   08/02/20 (!) 142/7   Most recent eGFR/CrCl: No results found for: "EGFR"  No components found for: "CRCL"  Evaluation of current treatment plan related to hypertension self management and patient's adherence to plan as established by provider. Patient reports that his BP was slightly elevated in office. He denies any swelling, chest pain or dizziness. He continues to have personal care services weekly for 4 hours provided by [employer] [place]. Currently, he does not have a way to check his BP at home.  Provided education to patient re: stroke prevention, s/s of heart attack and stroke. Education and support provided. Reviewed medications with patient and discussed importance of compliance. Reports compliance with all medications and all med rec completed with patient today. Counseled on adverse effects of illicit drug and excessive alcohol use in patients with high blood pressure. Denies any illicit drug or alcohol use. Counseled on the importance of exercise goals with target of 150 minutes per week. Patient states that he has been continuing with the exercises provided by home health and he is walking using his walker Discussed plans with patient for ongoing care management follow up and provided patient with direct contact information for care management team Advised patient, providing education and rationale, to monitor blood pressure daily and record, calling PCP for findings outside established parameters. RNCM discussed with patient that SBP<140 and DBP<90.  Reviewed scheduled/upcoming provider appointments including:  Advised patient to discuss any new changes with provider Discussed complications of poorly controlled blood pressure such as heart disease, stroke, circulatory complications, vision complications, kidney impairment, sexual dysfunction Screening for signs and symptoms of depression related to chronic disease state  Assessed social  determinant of health barriers  Patient Goals/Self-Care  Activities: Take all medications as prescribed Attend all scheduled provider appointments Call pharmacy for medication refills 3-7 days in advance of running out of medications Attend church or other social activities Perform IADL's (shopping, preparing meals, housekeeping, managing finances) independently Call provider office for new concerns or questions  check blood pressure daily write blood pressure results in a log or diary  Follow Up Plan:  Telephone follow up appointment with care management team member scheduled for:  08-18-2023 at 9:00 am           Consent to Services:  Patient was given information about care management services, agreed to services, and gave verbal consent to participate.   Plan: Telephone follow up appointment with care management team member scheduled for:08-18-2023 at 9:00 am Larey Brick, BSN RN [facility], [facility] RN Care Manager Direct Dial: [phone]  Fax: [phone]

## 2023-08-18 ENCOUNTER — Other Ambulatory Visit: Payer: Self-pay | Admitting: *Deleted

## 2023-08-23 DIAGNOSIS — D044 Carcinoma in situ of skin of scalp and neck: Secondary | ICD-10-CM | POA: Diagnosis not present

## 2023-09-03 ENCOUNTER — Other Ambulatory Visit: Payer: Self-pay | Admitting: *Deleted

## 2023-09-03 ENCOUNTER — Encounter: Payer: Self-pay | Admitting: *Deleted

## 2023-09-03 NOTE — Patient Outreach (Signed)
 Complex Care Management   Visit Note  09/03/2023  Name:  Steven Horton MRN: [id number] DOB: [birth year]  Situation: Referral received for Complex Care Management related to HTN I obtained verbal consent from Patient.  Visit completed with patient  on the phone  Background:   Past Medical History:  Diagnosis Date   Arthritis    DVT (deep venous thrombosis) (HCC)    HOH (hard of hearing)    Hypertension    Stomach ulcer     Assessment: Patient Reported Symptoms:  Cognitive Cognitive Status: Alert and oriented to person, place, and time, Normal speech and language skills, Insightful and able to interpret abstract concepts Cognitive/Intellectual Conditions Management [RPT]: None reported or documented in medical history or problem list   Health Maintenance Behaviors: Annual physical exam Healing Pattern: Average Health Facilitated by: Rest  Neurological Neurological Review of Symptoms: No symptoms reported Neurological Management Strategies: Routine screening Neurological Self-Management Outcome: 4 (good)  HEENT HEENT Symptoms Reported: Runny nose HEENT Management Strategies: Routine screening HEENT Self-Management Outcome: 4 (good)    Cardiovascular Cardiovascular Symptoms Reported: No symptoms reported Cardiovascular Conditions: Hypertension, High blood cholesterol Cardiovascular Management Strategies: Medication therapy, Routine screening Cardiovascular Self-Management Outcome: 4 (good)  Respiratory Respiratory Symptoms Reported: No symptoms reported Respiratory Conditions: Seasonal allergies Respiratory Self-Management Outcome: 4 (good)  Endocrine Patient reports the following symptoms related to hypoglycemia or hyperglycemia : No symptoms reported Is patient diabetic?: No Endocrine Self-Management Outcome: 4 (good)  Gastrointestinal Gastrointestinal Symptoms Reported: No symptoms reported Gastrointestinal Conditions: Constipation Gastrointestinal Management Strategies:  Medication therapy Nutrition Risk Screen (CP): No indicators present  Genitourinary Genitourinary Symptoms Reported: No symptoms reported Genitourinary Self-Management Outcome: 4 (good)  Integumentary Integumentary Symptoms Reported: Wound Skin Conditions: Wound Skin Management Strategies: Dressing changes, Routine screening Skin Comment: Wound on the top of his head from skin cancer removal. Frequent dressing changes  Musculoskeletal Musculoskelatal Symptoms Reviewed: Unsteady gait Musculoskeletal Conditions: Mobility limited Musculoskeletal Management Strategies: Medical device, Routine screening Falls in the past year?: Yes Number of falls in past year: 1 or less Was there an injury with Fall?: No Fall Risk Category Calculator: 1 Patient Fall Risk Level: Low Fall Risk Patient at Risk for Falls Due to: History of fall(s) Fall risk Follow up: Falls evaluation completed, Falls prevention discussed, Education provided  Psychosocial Psychosocial Symptoms Reported: No symptoms reported   Major Change/Loss/Stressor/Fears (CP): Denies Techniques to Cope with Loss/Stress/Change: None        09/03/2023    9:29 AM  Depression screen PHQ 2/9  Decreased Interest 0  Down, Depressed, Hopeless 0  PHQ - 2 Score 0    There were no vitals filed for this visit.  Medications Reviewed Today     Reviewed by Remona Carmel, RN (Registered Nurse) on 09/03/23 at 0915  Med List Status: <None>   Medication Order Taking? Sig Documenting Provider Last Dose Status Informant  amLODipine  (NORVASC ) 2.5 MG tablet [id number] Yes Take 2.5 mg by mouth daily. [provider] Taking Active Pharmacy Records, Self  apixaban  (ELIQUIS ) 2.5 MG TABS tablet [id number]  Take 1 tablet (2.5 mg total) by mouth 2 (two) times daily. McClung, Kevan D, [place]  Expired 03/15/23 2359   Ascorbic Acid (VITAMIN C) 1000 MG tablet [id number] Yes Take 1,000 mg by mouth daily. Jimmey Mould, MD Taking Active Self  aspirin  EC  81 MG tablet [id number] Yes Take 81 mg by mouth daily. [provider] Taking Active Pharmacy Records, Self  HYDROcodone -acetaminophen  (NORCO/VICODIN) 5-325 MG tablet [id number] No Take 1  tablet by mouth every 4 (four) hours as needed for moderate pain or severe pain.  Patient not taking: Reported on 09/03/2023   McClung, Kevan D, PA Not Taking Active   levothyroxine  (SYNTHROID ) 75 MCG tablet [id number] Yes Take 75 mcg by mouth daily before breakfast. [provider] Taking Active Self, Pharmacy Records  polyethylene glycol (MIRALAX  / GLYCOLAX ) 17 g packet [id number] No Take 17 g by mouth daily.  Patient not taking: Reported on 09/03/2023   Leona Rake, MD Not Taking Active   senna-docusate (SENOKOT-S) 8.6-50 MG tablet [id number] No Take 1 tablet by mouth 2 (two) times daily.  Patient not taking: Reported on 09/03/2023   Leona Rake, MD Not Taking Active   sodium bicarbonate 650 MG tablet [id number] Yes Take 650 mg by mouth 3 (three) times daily. [provider] Taking Active Pharmacy Records, Self  tamsulosin  (FLOMAX ) 0.4 MG CAPS capsule [id number] Yes Take 0.4 mg by mouth every evening.  [provider] Taking Active Pharmacy Records, Self  traMADol (ULTRAM) 50 MG tablet [id number] Yes Take 50 mg by mouth 2 (two) times daily. Jimmey Mould, MD Taking Active Self            Recommendation:   PCP Follow-up  Follow Up Plan:   Telephone follow up appointment date/time:  10-01-2023 at 9:00 am  Grandville Lax, BSN RN [facility], [facility] RN Care Manager Direct Dial: [phone]  Fax: [phone]

## 2023-09-03 NOTE — Patient Instructions (Signed)
 Visit Information  Thank you for taking time to visit with me today. Please don't hesitate to contact me if I can be of assistance to you before our next scheduled appointment.  Your next care management appointment is by telephone on 10-01-2023 at 9:00 am  Telephone follow-up in 1 month  Please call the care guide team at [phone] if you need to cancel, schedule, or reschedule an appointment.   Please call the Suicide and Crisis Lifeline: 988 call the [place]  National Suicide Prevention Lifeline: (810)084-2745 or TTY: 972-248-0630 TTY 984-727-7664) to talk to a trained counselor call 1-800-273-TALK (toll free, 24 hour hotline) go to [facility] [address] [place] [phone]) call 911 if you are experiencing a Mental Health or Behavioral Health Crisis or need someone to talk to.  Grandville Lax, BSN RN [facility], [facility] RN Care Manager Direct Dial: [phone]  Fax: [phone]  DASH Eating Plan DASH stands for Dietary Approaches to Stop Hypertension. The DASH eating plan is a healthy eating plan that has been shown to: Lower high blood pressure (hypertension). Reduce your risk for type 2 diabetes, heart disease, and stroke. Help with weight loss. What are tips for following this plan? Reading food labels Check food labels for the amount of salt (sodium) per serving. Choose foods with less than 5 percent of the Daily Value (DV) of sodium. In general, foods with less than 300 milligrams (mg) of sodium per serving fit into this eating plan. To find whole grains, look for the word "whole" as the first word in the ingredient list. Shopping Buy products labeled as "low-sodium" or "no salt added." Buy fresh foods. Avoid canned foods and pre-made or frozen meals. Cooking Try not to add salt when you cook. Use salt-free seasonings or herbs instead of table salt or sea salt. Check with your health  care provider or pharmacist before using salt substitutes. Do not fry foods. Cook foods in healthy ways, such as baking, boiling, grilling, roasting, or broiling. Cook using oils that are good for your heart. These include olive, canola, avocado, soybean, and sunflower oil. Meal planning  Eat a balanced diet. This should include: 4 or more servings of fruits and 4 or more servings of vegetables each day. Try to fill half of your plate with fruits and vegetables. 6-8 servings of whole grains each day. 6 or less servings of lean meat, poultry, or fish each day. 1 oz is 1 serving. A 3 oz (85 g) serving of meat is about the same size as the palm of your hand. One egg is 1 oz (28 g). 2-3 servings of low-fat dairy each day. One serving is 1 cup (237 mL). 1 serving of nuts, seeds, or beans 5 times each week. 2-3 servings of heart-healthy fats. Healthy fats called omega-3 fatty acids are found in foods such as walnuts, flaxseeds, fortified milks, and eggs. These fats are also found in cold-water fish, such as sardines, salmon, and mackerel. Limit how much you eat of: Canned or prepackaged foods. Food that is high in trans fat, such as fried foods. Food that is high in saturated fat, such as fatty meat. Desserts and other sweets, sugary drinks, and other foods with added sugar. Full-fat dairy products. Do not salt foods before eating. Do not eat more than 4 egg yolks a week. Try to eat at least 2 vegetarian meals a week. Eat more home-cooked food and less restaurant, buffet, and fast food. Lifestyle  When eating at a restaurant, ask if your food can be made with less salt or no salt. If you drink alcohol: Limit how much you have to: 0-1 drink a day if you are male. 0-2 drinks a day if you are male. Know how much alcohol is in your drink. In the [place] one drink is one 12 oz bottle of beer (355 mL), one 5 oz glass of wine (148 mL), or one 1 oz glass of hard liquor (44 mL). General  information Avoid eating more than 2,300 mg of salt a day. If you have hypertension, you may need to reduce your sodium intake to 1,500 mg a day. Work with your provider to stay at a healthy body weight or lose weight. Ask what the best weight range is for you. On most days of the week, get at least 30 minutes of exercise that causes your heart to beat faster. This may include walking, swimming, or biking. Work with your provider or dietitian to adjust your eating plan to meet your specific calorie needs. What foods should I eat? Fruits All fresh, dried, or frozen fruit. Canned fruits that are in their natural juice and do not have sugar added to them. Vegetables Fresh or frozen vegetables that are raw, steamed, roasted, or grilled. Low-sodium or reduced-sodium tomato and vegetable juice. Low-sodium or reduced-sodium tomato sauce and tomato paste. Low-sodium or reduced-sodium canned vegetables. Grains Whole-grain or whole-wheat bread. Whole-grain or whole-wheat pasta. Brown rice. Dwyane Glad. Bulgur. Whole-grain and low-sodium cereals. Pita bread. Low-fat, low-sodium crackers. Whole-wheat flour tortillas. Meats and other proteins Skinless chicken or [place]. Ground chicken or [place]. Pork with fat trimmed off. Fish and seafood. Egg whites. Dried beans, peas, or lentils. Unsalted nuts, nut butters, and seeds. Unsalted canned beans. Lean cuts of beef with fat trimmed off. Low-sodium, lean precooked or cured meat, such as sausages or meat loaves. Dairy Low-fat (1%) or fat-free (skim) milk. Reduced-fat, low-fat, or fat-free cheeses. Nonfat, low-sodium ricotta or cottage cheese. Low-fat or nonfat yogurt. Low-fat, low-sodium cheese. Fats and oils Soft margarine without trans fats. Vegetable oil. Reduced-fat, low-fat, or light mayonnaise and salad dressings (reduced-sodium). Canola, safflower, olive, avocado, soybean, and sunflower oils. Avocado. Seasonings and condiments Herbs. Spices. Seasoning  mixes without salt. Other foods Unsalted popcorn and pretzels. Fat-free sweets. The items listed above may not be all the foods and drinks you can have. Talk to a dietitian to learn more. What foods should I avoid? Fruits Canned fruit in a light or heavy syrup. Fried fruit. Fruit in cream or butter sauce. Vegetables Creamed or fried vegetables. Vegetables in a cheese sauce. Regular canned vegetables that are not marked as low-sodium or reduced-sodium. Regular canned tomato sauce and paste that are not marked as low-sodium or reduced-sodium. Regular tomato and vegetable juices that are not marked as low-sodium or reduced-sodium. Vanessa General. Olives. Grains Baked goods made with fat, such as croissants, muffins, or some breads. Dry pasta or rice meal packs. Meats and other proteins Fatty cuts of meat. Ribs. Fried meat. Helene Loader. Bologna, salami, and other precooked or cured meats, such as sausages or meat loaves, that are not lean and low in sodium. Fat from the back of a pig (fatback). Bratwurst. Salted nuts and seeds. Canned beans with added salt. Canned or smoked fish. Whole eggs or egg yolks. Chicken or [place] with skin. Dairy Whole or 2% milk, cream, and half-and-half. Whole or full-fat cream cheese. Whole-fat or sweetened yogurt. Full-fat cheese. Nondairy creamers. Whipped toppings. Processed cheese and cheese  spreads. Fats and oils Butter. Stick margarine. Lard. Shortening. Ghee. Bacon fat. Tropical oils, such as coconut, palm kernel, or palm oil. Seasonings and condiments Onion salt, garlic salt, seasoned salt, table salt, and sea salt. Worcestershire sauce. Tartar sauce. Barbecue sauce. Teriyaki sauce. Soy sauce, including reduced-sodium soy sauce. Steak sauce. Canned and packaged gravies. Fish sauce. Oyster sauce. Cocktail sauce. Store-bought horseradish. Ketchup. Mustard. Meat flavorings and tenderizers. Bouillon cubes. Hot sauces. Pre-made or packaged marinades. Pre-made or packaged taco  seasonings. Relishes. Regular salad dressings. Other foods Salted popcorn and pretzels. The items listed above may not be all the foods and drinks you should avoid. Talk to a dietitian to learn more. Where to find more information National Heart, Lung, and Blood Institute (NHLBI): BuffaloDryCleaner.gl American Heart Association (AHA): heart.org Academy of Nutrition and Dietetics: eatright.org National Kidney Foundation (NKF): kidney.org This information is not intended to replace advice given to you by your health care provider. Make sure you discuss any questions you have with your health care provider. Document Revised: 04/30/2022 Document Reviewed: 04/30/2022 Elsevier Patient Education  2024 [employer].

## 2023-10-01 ENCOUNTER — Other Ambulatory Visit: Payer: Self-pay

## 2023-10-01 NOTE — Patient Instructions (Signed)
 Visit Information  Thank you for taking time to visit with me today. Please don't hesitate to contact me if I can be of assistance to you before our next scheduled appointment.  Your next care management appointment is by telephone on 10/28/23 at 9 AM   Please call the care guide team at [phone] if you need to cancel, schedule, or reschedule an appointment.   Please call the Suicide and Crisis Lifeline: 988 call 1-800-273-TALK (toll free, 24 hour hotline) if you are experiencing a Mental Health or Behavioral Health Crisis or need someone to talk to.  Theodora Fish, RN MSN [place]  [facility] RN Care Manager Direct Dial: [phone]  Fax: [phone]

## 2023-10-01 NOTE — Patient Outreach (Signed)
 Complex Care Management   Visit Note  10/01/2023  Name:  Steven Horton MRN: [id number] DOB: Oct 11, 1930  Situation: Referral received for Complex Care Management related to HTN I obtained verbal consent from Patient.  Visit completed with Wilbur Handing  on the phone  Background:   Past Medical History:  Diagnosis Date   Arthritis    DVT (deep venous thrombosis) (HCC)    HOH (hard of hearing)    Hypertension    Stomach ulcer     Assessment: Patient Reported Symptoms:  Cognitive Cognitive Status: Able to follow simple commands, Alert and oriented to person, place, and time, Normal speech and language skills Cognitive/Intellectual Conditions Management [RPT]: None reported or documented in medical history or problem list   Health Maintenance Behaviors: Annual physical exam  Neurological Neurological Review of Symptoms: Not assessed    HEENT HEENT Symptoms Reported: Not assessed      Cardiovascular Cardiovascular Symptoms Reported: No symptoms reported Does patient have uncontrolled Hypertension?: No Cardiovascular Conditions: Hypertension, High blood cholesterol Cardiovascular Management Strategies: Medication therapy, Routine screening  Respiratory Respiratory Symptoms Reported: Not assesed    Endocrine Patient reports the following symptoms related to hypoglycemia or hyperglycemia : Not assessed Is patient diabetic?: No    Gastrointestinal Gastrointestinal Symptoms Reported: Not assessed      Genitourinary Genitourinary Symptoms Reported: Not assessed    Integumentary Integumentary Symptoms Reported: Wound Skin Conditions: Wound Skin Management Strategies: Dressing changes, Routine screening Skin Comment: Wound on the top of his head from skin cancer removal. Aide changes dressing daily (there from 2-5:30 during the week). Patient reports he has a follow-up appointment at the end of the month.  Musculoskeletal Musculoskelatal Symptoms Reviewed: Unsteady  gait Musculoskeletal Conditions: Mobility limited, Osteoarthritis Musculoskeletal Management Strategies: Medical device, Routine screening Falls in the past year?: Yes Number of falls in past year: 1 or less Was there an injury with Fall?: No Fall Risk Category Calculator: 1 Patient Fall Risk Level: Low Fall Risk Patient at Risk for Falls Due to: History of fall(s), Impaired balance/gait Fall risk Follow up: Falls evaluation completed, Education provided, Falls prevention discussed  Psychosocial Psychosocial Symptoms Reported: Not assessed     Quality of Family Relationships: helpful, involved, supportive Do you feel physically threatened by others?: No      09/03/2023    9:29 AM  Depression screen PHQ 2/9  Decreased Interest 0  Down, Depressed, Hopeless 0  PHQ - 2 Score 0    There were no vitals filed for this visit.  Medications Reviewed Today     Reviewed by Valaria Garland, RN (Registered Nurse) on 10/01/23 at [phone]  Med List Status: <None>   Medication Order Taking? Sig Documenting Provider Last Dose Status Informant  amLODipine  (NORVASC ) 2.5 MG tablet [id number] No Take 2.5 mg by mouth daily. [provider] Taking Active Pharmacy Records, Self  apixaban  (ELIQUIS ) 2.5 MG TABS tablet [id number]  Take 1 tablet (2.5 mg total) by mouth 2 (two) times daily. McClung, Kevan D, [place]  Expired 03/15/23 2359   Ascorbic Acid (VITAMIN C) 1000 MG tablet [id number] No Take 1,000 mg by mouth daily. Jimmey Mould, MD Taking Active Self  aspirin  EC 81 MG tablet [id number] No Take 81 mg by mouth daily. [provider] Taking Active Pharmacy Records, Self  HYDROcodone -acetaminophen  (NORCO/VICODIN) 5-325 MG tablet [id number] No Take 1 tablet by mouth every 4 (four) hours as needed for moderate pain or severe pain.  Patient not taking: Reported on 09/03/2023   McClung, Kevan  D, PA Not Taking Active   levothyroxine  (SYNTHROID ) 75 MCG tablet [id number] No Take 75 mcg by mouth  daily before breakfast. [provider] Taking Active Self, Pharmacy Records  polyethylene glycol (MIRALAX  / GLYCOLAX ) 17 g packet [id number] No Take 17 g by mouth daily.  Patient not taking: Reported on 09/03/2023   Leona Rake, MD Not Taking Active   senna-docusate (SENOKOT-S) 8.6-50 MG tablet [id number] No Take 1 tablet by mouth 2 (two) times daily.  Patient not taking: Reported on 09/03/2023   Leona Rake, MD Not Taking Active   sodium bicarbonate 650 MG tablet [id number] No Take 650 mg by mouth 3 (three) times daily. [provider] Taking Active Pharmacy Records, Self  tamsulosin  (FLOMAX ) 0.4 MG CAPS capsule [id number] No Take 0.4 mg by mouth every evening.  [provider] Taking Active Pharmacy Records, Self  traMADol (ULTRAM) 50 MG tablet [id number] No Take 50 mg by mouth 2 (two) times daily. Jimmey Mould, MD Taking Active Self            Recommendation:   PCP Follow-up Continue Current Plan of Care  Follow Up Plan:   Telephone follow up appointment date/time:  10/28/23 at 9 AM  Theodora Fish, RN MSN [place]  [facility] RN Care Manager Direct Dial: [phone]  Fax: [phone]

## 2023-10-28 ENCOUNTER — Other Ambulatory Visit: Payer: Self-pay

## 2023-10-28 NOTE — Patient Instructions (Signed)
 Visit Information  Thank you for taking time to visit with me today. Please don't hesitate to contact me if I can be of assistance to you before our next scheduled appointment.  Your next care management appointment is no further scheduled appointments.   Please call the care guide team at [phone] if you need to cancel, schedule, or reschedule an appointment.   Please call the Suicide and Crisis Lifeline: 988 call 1-800-273-TALK (toll free, 24 hour hotline) if you are experiencing a Mental Health or Behavioral Health Crisis or need someone to talk to.  Theodora Fish, RN MSN [place]  [facility] RN Care Manager Direct Dial : [phone]  Fax: [phone]

## 2023-10-28 NOTE — Patient Outreach (Signed)
 Complex Care Management   Visit Note  10/28/2023  Name:  Steven Horton MRN: [id number] DOB: 09/14/1930  Situation: Referral received for Complex Care Management related to HTN I obtained verbal consent from Patient.  Visit completed with Charlie Crete  on the phone  Background:   Past Medical History:  Diagnosis Date   Arthritis    DVT (deep venous thrombosis) (HCC)    HOH (hard of hearing)    Hypertension    Stomach ulcer     Assessment: Patient Reported Symptoms:  Cognitive Cognitive Status: Able to follow simple commands, Alert and oriented to person, place, and time, Normal speech and language skills Cognitive/Intellectual Conditions Management [RPT]: None reported or documented in medical history or problem list      Neurological Neurological Review of Symptoms: Not assessed    HEENT HEENT Symptoms Reported: Not assessed      Cardiovascular Cardiovascular Symptoms Reported: No symptoms reported Does patient have uncontrolled Hypertension?: No Cardiovascular Management Strategies: Medication therapy, Routine screening Cardiovascular Comment: Patient reports he does does have a BP cuff but needs new batteries. He plans to go out and get batteries today.  Respiratory Respiratory Symptoms Reported: Not assesed    Endocrine Endocrine Symptoms Reported: Not assessed    Gastrointestinal Gastrointestinal Symptoms Reported: Not assessed      Genitourinary Genitourinary Symptoms Reported: Not assessed    Integumentary Integumentary Symptoms Reported: No symptoms reported Additional Integumentary Details: Patient reports he has maintained regular follow-up with dermatology after skin cancer removal. No futher follow-up is scheduled at this time.    Musculoskeletal Musculoskelatal Symptoms Reviewed: Unsteady gait Musculoskeletal Management Strategies: Medical device, Routine screening Falls in the past year?: Yes Number of falls in past year: 1 or less (No falls since  previousi CMRN visit) Was there an injury with Fall?: No Fall Risk Category Calculator: 1 Patient Fall Risk Level: Low Fall Risk Patient at Risk for Falls Due to: Impaired balance/gait, History of fall(s) Fall risk Follow up: Falls evaluation completed, Education provided, Falls prevention discussed  Psychosocial Psychosocial Symptoms Reported: Not assessed            09/03/2023    9:29 AM  Depression screen PHQ 2/9  Decreased Interest 0  Down, Depressed, Hopeless 0  PHQ - 2 Score 0    There were no vitals filed for this visit.  Medications Reviewed Today     Reviewed by Arno Rosaline SQUIBB, RN (Registered Nurse) on 10/28/23 at [phone]  Med List Status: <None>   Medication Order Taking? Sig Documenting Provider Last Dose Status Informant  amLODipine  (NORVASC ) 2.5 MG tablet [id number] No Take 2.5 mg by mouth daily. [provider] Taking Active Pharmacy Records, Self  apixaban  (ELIQUIS ) 2.5 MG TABS tablet [id number]  Take 1 tablet (2.5 mg total) by mouth 2 (two) times daily. McClung, Kevan D, [place]  Expired 03/15/23 2359   Ascorbic Acid (VITAMIN C) 1000 MG tablet [id number] No Take 1,000 mg by mouth daily. Okey Carlin Redbird, MD Taking Active Self  aspirin  EC 81 MG tablet [id number] No Take 81 mg by mouth daily. [provider] Taking Active Pharmacy Records, Self  HYDROcodone -acetaminophen  (NORCO/VICODIN) 5-325 MG tablet [id number] No Take 1 tablet by mouth every 4 (four) hours as needed for moderate pain or severe pain.  Patient not taking: Reported on 09/03/2023   McClung, Kevan D, PA Not Taking Active   levothyroxine  (SYNTHROID ) 75 MCG tablet [id number] No Take 75 mcg by mouth daily before breakfast. [provider] Taking Active Self,  Pharmacy Records  polyethylene glycol (MIRALAX  / GLYCOLAX ) 17 g packet [id number] No Take 17 g by mouth daily.  Patient not taking: Reported on 09/03/2023   Jillian Buttery, MD Not Taking Active   senna-docusate (SENOKOT-S) 8.6-50 MG  tablet [id number] No Take 1 tablet by mouth 2 (two) times daily.  Patient not taking: Reported on 09/03/2023   Jillian Buttery, MD Not Taking Active   sodium bicarbonate 650 MG tablet [id number] No Take 650 mg by mouth 3 (three) times daily. [provider] Taking Active Pharmacy Records, Self  tamsulosin  (FLOMAX ) 0.4 MG CAPS capsule [id number] No Take 0.4 mg by mouth every evening.  [provider] Taking Active Pharmacy Records, Self  traMADol (ULTRAM) 50 MG tablet [id number] No Take 50 mg by mouth 2 (two) times daily. Okey Carlin Redbird, MD Taking Active Self            Recommendation:   PCP Follow-up Continue Current Plan of Care  Follow Up Plan:   Closing From:  Complex Care Management Patient has met all care management goals. Care Management case will be closed. Patient has been provided contact information should new needs arise.   Rosaline Finlay, RN MSN [place]  [facility] Population Health RN Care Manager Direct Dial: [phone]  Fax: [phone]

## 2023-11-30 DIAGNOSIS — M62838 Other muscle spasm: Secondary | ICD-10-CM | POA: Diagnosis not present

## 2023-12-20 ENCOUNTER — Other Ambulatory Visit: Payer: Medicare Other

## 2023-12-22 DIAGNOSIS — M549 Dorsalgia, unspecified: Secondary | ICD-10-CM | POA: Diagnosis not present

## 2023-12-22 DIAGNOSIS — Z6830 Body mass index (BMI) 30.0-30.9, adult: Secondary | ICD-10-CM | POA: Diagnosis not present

## 2023-12-22 DIAGNOSIS — M542 Cervicalgia: Secondary | ICD-10-CM | POA: Diagnosis not present

## 2023-12-22 DIAGNOSIS — M1990 Unspecified osteoarthritis, unspecified site: Secondary | ICD-10-CM | POA: Diagnosis not present

## 2023-12-24 DIAGNOSIS — N184 Chronic kidney disease, stage 4 (severe): Secondary | ICD-10-CM | POA: Diagnosis not present

## 2023-12-24 DIAGNOSIS — M47812 Spondylosis without myelopathy or radiculopathy, cervical region: Secondary | ICD-10-CM | POA: Diagnosis not present

## 2023-12-24 DIAGNOSIS — Z79891 Long term (current) use of opiate analgesic: Secondary | ICD-10-CM | POA: Diagnosis not present

## 2023-12-24 DIAGNOSIS — Z87891 Personal history of nicotine dependence: Secondary | ICD-10-CM | POA: Diagnosis not present

## 2023-12-24 DIAGNOSIS — Z604 Social exclusion and rejection: Secondary | ICD-10-CM | POA: Diagnosis not present

## 2023-12-24 DIAGNOSIS — Z7982 Long term (current) use of aspirin: Secondary | ICD-10-CM | POA: Diagnosis not present

## 2023-12-24 DIAGNOSIS — G8929 Other chronic pain: Secondary | ICD-10-CM | POA: Diagnosis not present

## 2023-12-24 DIAGNOSIS — I129 Hypertensive chronic kidney disease with stage 1 through stage 4 chronic kidney disease, or unspecified chronic kidney disease: Secondary | ICD-10-CM | POA: Diagnosis not present

## 2024-01-12 DIAGNOSIS — N1832 Chronic kidney disease, stage 3b: Secondary | ICD-10-CM | POA: Diagnosis not present

## 2024-01-12 DIAGNOSIS — I129 Hypertensive chronic kidney disease with stage 1 through stage 4 chronic kidney disease, or unspecified chronic kidney disease: Secondary | ICD-10-CM | POA: Diagnosis not present

## 2024-01-12 DIAGNOSIS — N2581 Secondary hyperparathyroidism of renal origin: Secondary | ICD-10-CM | POA: Diagnosis not present

## 2024-01-12 DIAGNOSIS — N189 Chronic kidney disease, unspecified: Secondary | ICD-10-CM | POA: Diagnosis not present

## 2024-01-13 DIAGNOSIS — N184 Chronic kidney disease, stage 4 (severe): Secondary | ICD-10-CM | POA: Diagnosis not present

## 2024-01-13 DIAGNOSIS — Z7982 Long term (current) use of aspirin: Secondary | ICD-10-CM | POA: Diagnosis not present

## 2024-01-13 DIAGNOSIS — I129 Hypertensive chronic kidney disease with stage 1 through stage 4 chronic kidney disease, or unspecified chronic kidney disease: Secondary | ICD-10-CM | POA: Diagnosis not present

## 2024-01-13 DIAGNOSIS — G8929 Other chronic pain: Secondary | ICD-10-CM | POA: Diagnosis not present

## 2024-01-13 DIAGNOSIS — Z79891 Long term (current) use of opiate analgesic: Secondary | ICD-10-CM | POA: Diagnosis not present

## 2024-01-13 DIAGNOSIS — M47812 Spondylosis without myelopathy or radiculopathy, cervical region: Secondary | ICD-10-CM | POA: Diagnosis not present

## 2024-01-17 DIAGNOSIS — Z79891 Long term (current) use of opiate analgesic: Secondary | ICD-10-CM | POA: Diagnosis not present

## 2024-01-17 DIAGNOSIS — G8929 Other chronic pain: Secondary | ICD-10-CM | POA: Diagnosis not present

## 2024-01-17 DIAGNOSIS — I129 Hypertensive chronic kidney disease with stage 1 through stage 4 chronic kidney disease, or unspecified chronic kidney disease: Secondary | ICD-10-CM | POA: Diagnosis not present

## 2024-01-17 DIAGNOSIS — M47812 Spondylosis without myelopathy or radiculopathy, cervical region: Secondary | ICD-10-CM | POA: Diagnosis not present

## 2024-01-17 DIAGNOSIS — N184 Chronic kidney disease, stage 4 (severe): Secondary | ICD-10-CM | POA: Diagnosis not present

## 2024-01-17 DIAGNOSIS — Z7982 Long term (current) use of aspirin: Secondary | ICD-10-CM | POA: Diagnosis not present

## 2024-01-19 DIAGNOSIS — L570 Actinic keratosis: Secondary | ICD-10-CM | POA: Diagnosis not present

## 2024-01-19 DIAGNOSIS — L814 Other melanin hyperpigmentation: Secondary | ICD-10-CM | POA: Diagnosis not present

## 2024-01-19 DIAGNOSIS — C44319 Basal cell carcinoma of skin of other parts of face: Secondary | ICD-10-CM | POA: Diagnosis not present

## 2024-01-19 DIAGNOSIS — D492 Neoplasm of unspecified behavior of bone, soft tissue, and skin: Secondary | ICD-10-CM | POA: Diagnosis not present

## 2024-01-19 DIAGNOSIS — Z85828 Personal history of other malignant neoplasm of skin: Secondary | ICD-10-CM | POA: Diagnosis not present

## 2024-01-19 DIAGNOSIS — L821 Other seborrheic keratosis: Secondary | ICD-10-CM | POA: Diagnosis not present

## 2024-01-19 DIAGNOSIS — Z08 Encounter for follow-up examination after completed treatment for malignant neoplasm: Secondary | ICD-10-CM | POA: Diagnosis not present

## 2024-01-19 DIAGNOSIS — Z23 Encounter for immunization: Secondary | ICD-10-CM | POA: Diagnosis not present

## 2024-01-23 DIAGNOSIS — G8929 Other chronic pain: Secondary | ICD-10-CM | POA: Diagnosis not present

## 2024-01-23 DIAGNOSIS — I129 Hypertensive chronic kidney disease with stage 1 through stage 4 chronic kidney disease, or unspecified chronic kidney disease: Secondary | ICD-10-CM | POA: Diagnosis not present

## 2024-01-23 DIAGNOSIS — Z87891 Personal history of nicotine dependence: Secondary | ICD-10-CM | POA: Diagnosis not present

## 2024-01-23 DIAGNOSIS — Z604 Social exclusion and rejection: Secondary | ICD-10-CM | POA: Diagnosis not present

## 2024-01-23 DIAGNOSIS — N184 Chronic kidney disease, stage 4 (severe): Secondary | ICD-10-CM | POA: Diagnosis not present

## 2024-01-23 DIAGNOSIS — M47812 Spondylosis without myelopathy or radiculopathy, cervical region: Secondary | ICD-10-CM | POA: Diagnosis not present

## 2024-01-23 DIAGNOSIS — Z7982 Long term (current) use of aspirin: Secondary | ICD-10-CM | POA: Diagnosis not present

## 2024-01-23 DIAGNOSIS — Z79891 Long term (current) use of opiate analgesic: Secondary | ICD-10-CM | POA: Diagnosis not present

## 2024-01-26 DIAGNOSIS — N184 Chronic kidney disease, stage 4 (severe): Secondary | ICD-10-CM | POA: Diagnosis not present

## 2024-01-26 DIAGNOSIS — M47812 Spondylosis without myelopathy or radiculopathy, cervical region: Secondary | ICD-10-CM | POA: Diagnosis not present

## 2024-01-26 DIAGNOSIS — I129 Hypertensive chronic kidney disease with stage 1 through stage 4 chronic kidney disease, or unspecified chronic kidney disease: Secondary | ICD-10-CM | POA: Diagnosis not present

## 2024-01-26 DIAGNOSIS — G8929 Other chronic pain: Secondary | ICD-10-CM | POA: Diagnosis not present

## 2024-01-26 DIAGNOSIS — Z79891 Long term (current) use of opiate analgesic: Secondary | ICD-10-CM | POA: Diagnosis not present

## 2024-01-26 DIAGNOSIS — Z7982 Long term (current) use of aspirin: Secondary | ICD-10-CM | POA: Diagnosis not present

## 2024-01-31 DIAGNOSIS — M47812 Spondylosis without myelopathy or radiculopathy, cervical region: Secondary | ICD-10-CM | POA: Diagnosis not present

## 2024-01-31 DIAGNOSIS — N184 Chronic kidney disease, stage 4 (severe): Secondary | ICD-10-CM | POA: Diagnosis not present

## 2024-01-31 DIAGNOSIS — I129 Hypertensive chronic kidney disease with stage 1 through stage 4 chronic kidney disease, or unspecified chronic kidney disease: Secondary | ICD-10-CM | POA: Diagnosis not present

## 2024-01-31 DIAGNOSIS — G8929 Other chronic pain: Secondary | ICD-10-CM | POA: Diagnosis not present

## 2024-01-31 DIAGNOSIS — Z79891 Long term (current) use of opiate analgesic: Secondary | ICD-10-CM | POA: Diagnosis not present

## 2024-01-31 DIAGNOSIS — Z7982 Long term (current) use of aspirin: Secondary | ICD-10-CM | POA: Diagnosis not present

## 2024-02-07 DIAGNOSIS — G8929 Other chronic pain: Secondary | ICD-10-CM | POA: Diagnosis not present

## 2024-02-07 DIAGNOSIS — Z7982 Long term (current) use of aspirin: Secondary | ICD-10-CM | POA: Diagnosis not present

## 2024-02-07 DIAGNOSIS — I129 Hypertensive chronic kidney disease with stage 1 through stage 4 chronic kidney disease, or unspecified chronic kidney disease: Secondary | ICD-10-CM | POA: Diagnosis not present

## 2024-02-07 DIAGNOSIS — Z79891 Long term (current) use of opiate analgesic: Secondary | ICD-10-CM | POA: Diagnosis not present

## 2024-02-07 DIAGNOSIS — M47812 Spondylosis without myelopathy or radiculopathy, cervical region: Secondary | ICD-10-CM | POA: Diagnosis not present

## 2024-02-07 DIAGNOSIS — N184 Chronic kidney disease, stage 4 (severe): Secondary | ICD-10-CM | POA: Diagnosis not present

## 2024-02-16 DIAGNOSIS — Z7982 Long term (current) use of aspirin: Secondary | ICD-10-CM | POA: Diagnosis not present

## 2024-02-16 DIAGNOSIS — Z79891 Long term (current) use of opiate analgesic: Secondary | ICD-10-CM | POA: Diagnosis not present

## 2024-02-16 DIAGNOSIS — N184 Chronic kidney disease, stage 4 (severe): Secondary | ICD-10-CM | POA: Diagnosis not present

## 2024-02-16 DIAGNOSIS — G8929 Other chronic pain: Secondary | ICD-10-CM | POA: Diagnosis not present

## 2024-02-16 DIAGNOSIS — I129 Hypertensive chronic kidney disease with stage 1 through stage 4 chronic kidney disease, or unspecified chronic kidney disease: Secondary | ICD-10-CM | POA: Diagnosis not present

## 2024-02-16 DIAGNOSIS — M47812 Spondylosis without myelopathy or radiculopathy, cervical region: Secondary | ICD-10-CM | POA: Diagnosis not present

## 2024-05-03 ENCOUNTER — Emergency Department (HOSPITAL_COMMUNITY)

## 2024-05-03 ENCOUNTER — Other Ambulatory Visit: Payer: Self-pay

## 2024-05-03 ENCOUNTER — Emergency Department (EMERGENCY_DEPARTMENT_HOSPITAL)
Admit: 2024-05-03 | Discharge: 2024-05-03 | Disposition: A | Discharge status: Home / Self Care | Attending: Emergency Medicine | Admitting: Emergency Medicine

## 2024-05-03 ENCOUNTER — Emergency Department (HOSPITAL_COMMUNITY)
Admission: EM | Admit: 2024-05-03 | Discharge: 2024-05-03 | Disposition: A | Discharge status: Home / Self Care | Attending: Emergency Medicine | Admitting: Emergency Medicine

## 2024-05-03 DIAGNOSIS — Z7901 Long term (current) use of anticoagulants: Secondary | ICD-10-CM | POA: Insufficient documentation

## 2024-05-03 DIAGNOSIS — M79605 Pain in left leg: Secondary | ICD-10-CM | POA: Diagnosis not present

## 2024-05-03 DIAGNOSIS — Z79899 Other long term (current) drug therapy: Secondary | ICD-10-CM | POA: Insufficient documentation

## 2024-05-03 DIAGNOSIS — I10 Essential (primary) hypertension: Secondary | ICD-10-CM | POA: Diagnosis not present

## 2024-05-03 DIAGNOSIS — M25562 Pain in left knee: Secondary | ICD-10-CM | POA: Diagnosis present

## 2024-05-03 LAB — CBC WITH DIFFERENTIAL/PLATELET
Abs Immature Granulocytes: 0.01 K/uL (ref 0.00–0.07)
Basophils Absolute: 0 K/uL (ref 0.0–0.1)
Basophils Relative: 0 %
Eosinophils Absolute: 0 K/uL (ref 0.0–0.5)
Eosinophils Relative: 0 %
HCT: 46.5 % (ref 39.0–52.0)
Hemoglobin: 16.1 g/dL (ref 13.0–17.0)
Immature Granulocytes: 0 %
Lymphocytes Relative: 8 %
Lymphs Abs: 0.6 K/uL — ABNORMAL LOW (ref 0.7–4.0)
MCH: 32.9 pg (ref 26.0–34.0)
MCHC: 34.6 g/dL (ref 30.0–36.0)
MCV: 94.9 fL (ref 80.0–100.0)
Monocytes Absolute: 0.9 K/uL (ref 0.1–1.0)
Monocytes Relative: 11 %
Neutro Abs: 6.7 K/uL (ref 1.7–7.7)
Neutrophils Relative %: 81 %
Platelets: 169 K/uL (ref 150–400)
RBC: 4.9 MIL/uL (ref 4.22–5.81)
RDW: 14.6 % (ref 11.5–15.5)
WBC: 8.3 K/uL (ref 4.0–10.5)
nRBC: 0 % (ref 0.0–0.2)

## 2024-05-03 LAB — BASIC METABOLIC PANEL WITH GFR
Anion gap: 16 — ABNORMAL HIGH (ref 5–15)
BUN: 21 mg/dL (ref 8–23)
CO2: 20 mmol/L — ABNORMAL LOW (ref 22–32)
Calcium: 9.7 mg/dL (ref 8.9–10.3)
Chloride: 105 mmol/L (ref 98–111)
Creatinine, Ser: 1.62 mg/dL — ABNORMAL HIGH (ref 0.61–1.24)
GFR, Estimated: 40 mL/min — ABNORMAL LOW
Glucose, Bld: 120 mg/dL — ABNORMAL HIGH (ref 70–99)
Potassium: 3.9 mmol/L (ref 3.5–5.1)
Sodium: 141 mmol/L (ref 135–145)

## 2024-05-03 MED ORDER — OXYCODONE-ACETAMINOPHEN 5-325 MG PO TABS
1.0000 | ORAL_TABLET | ORAL | Status: AC | PRN
  Administered 2024-05-03 (×2): 1 via ORAL
  Filled 2024-05-03 (×2): qty 1

## 2024-05-03 NOTE — ED Triage Notes (Signed)
 Pt bib ems from home with reports of sudden LLE pain yesterday morning. Denies any trauma. Pain has progressively worsened to where pt unable to bear weight.

## 2024-05-03 NOTE — Progress Notes (Signed)
 Left lower extremity venous duplex has been completed. Preliminary results can be found in CV Proc through chart review.  Results were given to Dr. Francesca.  05/03/2024 4:01 PM Cathlyn Collet RVT

## 2024-05-03 NOTE — Discharge Instructions (Signed)
 As discussed, today's evaluation is suggested that there is loosening of the hardware within your knee replacement.  It is important to call the orthopedic office tomorrow to arrange appropriate ongoing outpatient care.  Return here for concerning changes in your condition.

## 2024-05-03 NOTE — ED Provider Notes (Signed)
 " [place] EMERGENCY DEPARTMENT AT [facility] Provider Note  CSN: [id number] Arrival date & time: 05/03/24 1206  Chief Complaint(s) Leg Pain  HPI Dominiq Fontaine is a 89 y.o. male history of DVT on Eliquis , hypertension, prior left knee replacement, lives alone presenting with left knee pain.  Patient reports that yesterday through today has had progressively worsening pain around the left lower extremity, mainly around the superior aspect of the knee.  Denies any trauma, fall.  Today was not able to put any weight on it and had to be brought by EMS.  Denies any fevers or chills.  Lives alone had to press life alert.  Has family but they live in [place] .  No abdominal pain, cough, chest pain, shortness of breath, leg swelling.   Past Medical History Past Medical History:  Diagnosis Date   Arthritis    DVT (deep venous thrombosis) (HCC)    HOH (hard of hearing)    Hypertension    Stomach ulcer    Patient Active Problem List   Diagnosis Date Noted   Closed intertrochanteric fracture of hip, right, initial encounter (HCC) 01/28/2023   History of DVT (deep vein thrombosis) 01/28/2023   Chronic kidney disease, stage 3b (HCC) 01/28/2023   Primary osteoarthritis of left knee 01/15/2017   Failed total knee, left 01/14/2017   Phlebitis and thrombophlebitis of other deep vessels of lower extremities 02/10/2013   Leg swelling 02/10/2013   Pain in joint, lower leg 02/10/2013   DVT (deep venous thrombosis) (HCC) 02/10/2013   Baker's cyst, ruptured 02/10/2013   Home Medication(s) Prior to Admission medications  Medication Sig Start Date End Date Taking? Authorizing Provider  amLODipine  (NORVASC ) 2.5 MG tablet Take 2.5 mg by mouth daily.    [provider]  apixaban  (ELIQUIS ) 2.5 MG TABS tablet Take 1 tablet (2.5 mg total) by mouth 2 (two) times daily. 01/29/23 03/15/23  McClung, Kevan D, PA  Ascorbic Acid (VITAMIN C) 1000 MG tablet Take 1,000 mg by mouth daily.    Okey Carlin Redbird, MD  aspirin  EC 81 MG tablet Take 81 mg by mouth daily. 08/01/14   [provider]  levothyroxine  (SYNTHROID ) 75 MCG tablet Take 75 mcg by mouth daily before breakfast.    [provider]  polyethylene glycol (MIRALAX  / GLYCOLAX ) 17 g packet Take 17 g by mouth daily. Patient not taking: Reported on 09/03/2023 02/02/23   Jillian Buttery, MD  senna-docusate (SENOKOT-S) 8.6-50 MG tablet Take 1 tablet by mouth 2 (two) times daily. Patient not taking: Reported on 09/03/2023 02/01/23   Jillian Buttery, MD  sodium bicarbonate 650 MG tablet Take 650 mg by mouth 3 (three) times daily. 05/19/22   [provider]  tamsulosin  (FLOMAX ) 0.4 MG CAPS capsule Take 0.4 mg by mouth every evening.  12/14/16   [provider]  traMADol (ULTRAM) 50 MG tablet Take 50 mg by mouth 2 (two) times daily.    Okey Carlin Redbird, MD  Past Surgical History Past Surgical History:  Procedure Laterality Date   INTRAMEDULLARY (IM) NAIL INTERTROCHANTERIC Right 01/28/2023   Procedure: INTRAMEDULLARY (IM) NAIL INTERTROCHANTERIC;  Surgeon: Sharl Selinda Dover, MD;  Location: [facility] OR;  Service: Orthopedics;  Laterality: Right;   JOINT REPLACEMENT     lumps removed     right arm, back,    REPLACEMENT TOTAL KNEE BILATERAL     SHOULDER SURGERY     multiple right   TOTAL KNEE REVISION Left 01/15/2017   Procedure: TOTAL KNEE REVISION;  Surgeon: Liam Lerner, MD;  Location: [facility] OR;  Service: Orthopedics;  Laterality: Left;   Family History Family History  Problem Relation Age of Onset   Cancer Sister     Social History Social History[1] Allergies Prednisone  Review of Systems Review of Systems  All other systems reviewed and are negative.   Physical Exam Vital Signs  I have reviewed the triage vital signs BP (!) 180/66   Pulse 72   Temp 98.5 F (36.9 C)  (Oral)   Resp 17   SpO2 95%  Physical Exam Vitals and nursing note reviewed.  Constitutional:      General: He is not in acute distress.    Appearance: Normal appearance.  HENT:     Mouth/Throat:     Mouth: Mucous membranes are moist.  Eyes:     Conjunctiva/sclera: Conjunctivae normal.  Cardiovascular:     Rate and Rhythm: Normal rate and regular rhythm.  Pulmonary:     Effort: Pulmonary effort is normal. No respiratory distress.     Breath sounds: Normal breath sounds.  Abdominal:     General: Abdomen is flat.     Palpations: Abdomen is soft.     Tenderness: There is no abdominal tenderness.  Musculoskeletal:     Right lower leg: No edema.     Comments: Left lower extremity with intact PT pulse, no tenderness around the ankle, foot, calf.  Tenderness to the knee without effusion or warmth, mainly on proximal aspect.  Able to range at the femur.  Skin:    General: Skin is warm and dry.     Capillary Refill: Capillary refill takes less than 2 seconds.  Neurological:     Mental Status: He is alert and oriented to person, place, and time. Mental status is at baseline.  Psychiatric:        Mood and Affect: Mood normal.        Behavior: Behavior normal.     ED Results and Treatments Labs (all labs ordered are listed, but only abnormal results are displayed) Labs Reviewed  BASIC METABOLIC PANEL WITH GFR - Abnormal; Notable for the following components:      Result Value   CO2 20 (*)    Glucose, Bld 120 (*)    Creatinine, Ser 1.62 (*)    GFR, Estimated 40 (*)    Anion gap 16 (*)    All other components within normal limits  CBC WITH DIFFERENTIAL/PLATELET - Abnormal; Notable for the following components:   Lymphs Abs 0.6 (*)    All other components within normal limits  Radiology VAS US  LOWER EXTREMITY VENOUS (DVT) (7a-5p) Result Date: 05/03/2024  Lower  Venous DVT Study Patient Name:  JOCSAN MCGINLEY  Date of Exam:   05/03/2024 Medical Rec #: [id number]        Accession #:    [id number] Date of Birth: November 09, 1930        Patient Gender: M Patient Age:   89 years Exam Location:  [facility] Procedure:      VAS US  LOWER EXTREMITY VENOUS (DVT) Referring Phys: ELSIE BODY --------------------------------------------------------------------------------  Indications: Pain.  Risk Factors: None identified. Limitations: Poor ultrasound/tissue interface and patient positioning. Comparison Study: No prior studies. Performing Technologist: Cordella Collet RVT  Examination Guidelines: A complete evaluation includes B-mode imaging, spectral Doppler, color Doppler, and power Doppler as needed of all accessible portions of each vessel. Bilateral testing is considered an integral part of a complete examination. Limited examinations for reoccurring indications may be performed as noted. The reflux portion of the exam is performed with the patient in reverse Trendelenburg.  +-----+---------------+---------+-----------+----------+--------------+ RIGHTCompressibilityPhasicitySpontaneityPropertiesThrombus Aging +-----+---------------+---------+-----------+----------+--------------+ CFV  Full           Yes      Yes                                 +-----+---------------+---------+-----------+----------+--------------+   +---------+---------------+---------+-----------+----------+-------------------+ LEFT     CompressibilityPhasicitySpontaneityPropertiesThrombus Aging      +---------+---------------+---------+-----------+----------+-------------------+ CFV      Full           Yes      Yes                                      +---------+---------------+---------+-----------+----------+-------------------+ SFJ      Full                                                              +---------+---------------+---------+-----------+----------+-------------------+ FV Prox  Full                                                             +---------+---------------+---------+-----------+----------+-------------------+ FV Mid   Full                                                             +---------+---------------+---------+-----------+----------+-------------------+ FV Distal               Yes      Yes                                      +---------+---------------+---------+-----------+----------+-------------------+ PFV      Full                                                             +---------+---------------+---------+-----------+----------+-------------------+  POP      Full           Yes      Yes                                      +---------+---------------+---------+-----------+----------+-------------------+ PTV      Full                                                             +---------+---------------+---------+-----------+----------+-------------------+ PERO                                                  Not well visualized +---------+---------------+---------+-----------+----------+-------------------+    Summary: RIGHT: - No evidence of common femoral vein obstruction.   LEFT: - There is no evidence of deep vein thrombosis in the lower extremity. However, portions of this examination were limited- see technologist comments above.  - No cystic structure found in the popliteal fossa.  *See table(s) above for measurements and observations.    Preliminary    DG Knee Complete 4 Views Left Result Date: 05/03/2024 CLINICAL DATA:  Hip pain EXAM: LEFT KNEE - COMPLETE 4+ VIEW COMPARISON:  01/15/2017, patient's interval imaging from 2021 not available at the time of dictation FINDINGS: No acute fracture or dislocation is seen. Probable small knee effusion. Knee replacement. Stable appearance of the tibial hardware. Suspect that  there has been interval revision of the femoral prosthesis compared to prior. There is mild diffuse lucency surrounding the femoral stem and at the cement bone interface anteriorly of the articular component. Stem is slightly eccentric and lateral within the femoral shaft. IMPRESSION: 1. No acute osseous abnormality.  Probable knee effusion 2. Knee replacement with suspected interval revision of the femoral prosthesis compared to prior. There is mild diffuse lucency surrounding the femoral stem with eccentric positioning of the stem laterally within the shaft of the femur, suggestive of hardware loosening Electronically Signed   By: Luke Bun M.D.   On: 05/03/2024 16:04   DG Hip Unilat W or Wo Pelvis 2-3 Views Left Result Date: 05/03/2024 CLINICAL DATA:  Left LE pain EXAM: DG HIP (WITH OR WITHOUT PELVIS) 2-3V LEFT COMPARISON:  08/01/2020, 01/28/2023 FINDINGS: SI joint degenerative change. Pubic symphysis and rami appear intact. Intramedullary rod in the right femur across old right proximal femoral fracture. No acute fracture or malalignment. Mild left hip degenerative change. IMPRESSION: No acute osseous abnormality. Mild left hip degenerative change. Electronically Signed   By: Luke Bun M.D.   On: 05/03/2024 15:53    Pertinent labs & imaging results that were available during my care of the patient were reviewed by me and considered in my medical decision making (see MDM for details).  Medications Ordered in ED Medications  oxyCODONE -acetaminophen  (PERCOCET/ROXICET) 5-325 MG per tablet 1 tablet (1 tablet Oral Given 05/03/24 1307)  Procedures Procedures  (including critical care time)  Medical Decision Making / ED Course   MDM:  89 year old presenting to the emergency department knee pain.  Patient is overall well-appearing, physical examination with  tenderness around the left knee, without obvious deformity or signs of infection.  Differential includes complication of arthroplasty, occult fracture, crystal arthropathy.  Consider DVT, ultrasound is negative for DVT.  Consider occult infection, patient has no clinical signs of infection including no fevers or chills, warmth or erythema at the knee joint.  X-rays with possible loosening of device.  Will obtain CT knee.  Signed out to oncoming provider Dr. Garrick pending CT.     Additional history obtained: -Additional history obtained from ems -External records from outside source obtained and reviewed including: Chart review including previous notes, labs, imaging, consultation notes including prior notes    Lab Tests: -I ordered, reviewed, and interpreted labs.   The pertinent results include:   Labs Reviewed  BASIC METABOLIC PANEL WITH GFR - Abnormal; Notable for the following components:      Result Value   CO2 20 (*)    Glucose, Bld 120 (*)    Creatinine, Ser 1.62 (*)    GFR, Estimated 40 (*)    Anion gap 16 (*)    All other components within normal limits  CBC WITH DIFFERENTIAL/PLATELET - Abnormal; Notable for the following components:   Lymphs Abs 0.6 (*)    All other components within normal limits    Notable for stable CKD    Imaging Studies ordered: I ordered imaging studies including US , XR On my interpretation imaging demonstrates no DVT  I independently visualized and interpreted imaging. I agree with the radiologist interpretation   Medicines ordered and prescription drug management: Meds ordered this encounter  Medications   oxyCODONE -acetaminophen  (PERCOCET/ROXICET) 5-325 MG per tablet 1 tablet    Refill:  0    -I have reviewed the patients home medicines and have made adjustments as needed   Social Determinants of Health:  Diagnosis or treatment significantly limited by social determinants of health: lives alone   Reevaluation: After the  interventions noted above, I reevaluated the patient and found that their symptoms have improved  Co morbidities that complicate the patient evaluation  Past Medical History:  Diagnosis Date   Arthritis    DVT (deep venous thrombosis) (HCC)    HOH (hard of hearing)    Hypertension    Stomach ulcer       Dispostion: Disposition decision including need for hospitalization was considered, and patient disposition pending at time of sign out.    Final Clinical Impression(s) / ED Diagnoses Final diagnoses:  Acute pain of left knee     This chart was dictated using voice recognition software.  Despite best efforts to proofread,  errors can occur which can change the documentation meaning.     [1]  Social History Tobacco Use   Smoking status: Former    Current packs/day: 0.00    Types: Cigarettes    Quit date: 04/27/1978    Years since quitting: 46.0   Smokeless tobacco: Never  Vaping Use   Vaping status: Never Used  Substance Use Topics   Alcohol use: Yes    Alcohol/week: 4.0 standard drinks of alcohol    Types: 4 Shots of liquor per week   Drug use: No     Francesca Elsie CROME, MD 05/03/24 1700  "

## 2024-05-03 NOTE — ED Notes (Signed)
Pt gone for xray

## 2024-05-03 NOTE — ED Provider Notes (Signed)
 Care of the patient assumed at signout. On signout patient awaiting CT knee.  He has a history of knee replacement with Dr. Liam, now with pain, difficulty bearing weight, initial x-ray with concern for possible loosening of the femoral hardware.  7:34 PM Patient in no distress, we discussed x-ray, CT findings and I discussed them with Dr. Beuford one of the patient's orthopedic surgeons colleagues. Patient we discharged, he has a walker, to follow-up in the clinic for possible revision.   Steven Charleston, MD 05/03/24 [phone]
